# Patient Record
Sex: Female | Born: 1935 | Race: White | Hispanic: No | Marital: Married | Smoking: Never smoker
Health system: Southern US, Community
[De-identification: ages and names within clinical notes are randomized; demographics above are authoritative.]

## PROBLEM LIST (undated history)

## (undated) DIAGNOSIS — C801 Malignant (primary) neoplasm, unspecified: Secondary | ICD-10-CM

## (undated) DIAGNOSIS — R072 Precordial pain: Secondary | ICD-10-CM

## (undated) DIAGNOSIS — K56609 Unspecified intestinal obstruction, unspecified as to partial versus complete obstruction: Secondary | ICD-10-CM

## (undated) DIAGNOSIS — K5792 Diverticulitis of intestine, part unspecified, without perforation or abscess without bleeding: Secondary | ICD-10-CM

## (undated) DIAGNOSIS — K5900 Constipation, unspecified: Secondary | ICD-10-CM

## (undated) DIAGNOSIS — E7800 Pure hypercholesterolemia, unspecified: Secondary | ICD-10-CM

## (undated) DIAGNOSIS — M1990 Unspecified osteoarthritis, unspecified site: Secondary | ICD-10-CM

## (undated) DIAGNOSIS — C499 Malignant neoplasm of connective and soft tissue, unspecified: Secondary | ICD-10-CM

## (undated) DIAGNOSIS — K5790 Diverticulosis of intestine, part unspecified, without perforation or abscess without bleeding: Secondary | ICD-10-CM

## (undated) DIAGNOSIS — T4145XA Adverse effect of unspecified anesthetic, initial encounter: Secondary | ICD-10-CM

## (undated) DIAGNOSIS — K5909 Other constipation: Secondary | ICD-10-CM

## (undated) DIAGNOSIS — T8859XA Other complications of anesthesia, initial encounter: Secondary | ICD-10-CM

## (undated) HISTORY — PX: ABDOMINAL HYSTERECTOMY: SHX81

## (undated) HISTORY — PX: TONSILLECTOMY: SUR1361

## (undated) HISTORY — PX: TUMOR REMOVAL: SHX12

---

## 1966-04-28 HISTORY — PX: APPENDECTOMY: SHX54

## 1998-04-12 ENCOUNTER — Ambulatory Visit (HOSPITAL_COMMUNITY): Admission: RE | Admit: 1998-04-12 | Discharge: 1998-04-12 | Payer: Self-pay | Admitting: Obstetrics & Gynecology

## 1998-04-12 ENCOUNTER — Encounter: Payer: Self-pay | Admitting: Obstetrics & Gynecology

## 1998-05-02 ENCOUNTER — Ambulatory Visit: Admission: RE | Admit: 1998-05-02 | Discharge: 1998-05-02 | Payer: Self-pay | Admitting: Gynecology

## 1998-05-03 ENCOUNTER — Encounter: Payer: Self-pay | Admitting: Obstetrics & Gynecology

## 1998-05-04 ENCOUNTER — Ambulatory Visit (HOSPITAL_COMMUNITY): Admission: RE | Admit: 1998-05-04 | Discharge: 1998-05-04 | Payer: Self-pay | Admitting: Obstetrics & Gynecology

## 1998-05-04 ENCOUNTER — Encounter: Payer: Self-pay | Admitting: Obstetrics & Gynecology

## 1998-05-04 ENCOUNTER — Encounter: Payer: Self-pay | Admitting: Gynecology

## 1998-05-08 ENCOUNTER — Ambulatory Visit: Admission: RE | Admit: 1998-05-08 | Discharge: 1998-05-08 | Payer: Self-pay | Admitting: Gynecology

## 1998-11-28 ENCOUNTER — Other Ambulatory Visit: Admission: RE | Admit: 1998-11-28 | Discharge: 1998-11-28 | Payer: Self-pay | Admitting: Obstetrics & Gynecology

## 1998-12-04 ENCOUNTER — Ambulatory Visit (HOSPITAL_COMMUNITY): Admission: RE | Admit: 1998-12-04 | Discharge: 1998-12-04 | Payer: Self-pay | Admitting: Obstetrics & Gynecology

## 1998-12-04 ENCOUNTER — Encounter: Payer: Self-pay | Admitting: Obstetrics & Gynecology

## 1999-10-21 ENCOUNTER — Ambulatory Visit (HOSPITAL_COMMUNITY): Admission: RE | Admit: 1999-10-21 | Discharge: 1999-10-21 | Payer: Self-pay | Admitting: Obstetrics & Gynecology

## 1999-10-21 ENCOUNTER — Encounter: Payer: Self-pay | Admitting: Obstetrics & Gynecology

## 1999-12-17 ENCOUNTER — Other Ambulatory Visit: Admission: RE | Admit: 1999-12-17 | Discharge: 1999-12-17 | Payer: Self-pay | Admitting: Obstetrics & Gynecology

## 2000-08-13 ENCOUNTER — Emergency Department (HOSPITAL_COMMUNITY): Admission: EM | Admit: 2000-08-13 | Discharge: 2000-08-14 | Payer: Self-pay | Admitting: Emergency Medicine

## 2000-08-14 ENCOUNTER — Encounter: Payer: Self-pay | Admitting: Emergency Medicine

## 2000-08-20 ENCOUNTER — Ambulatory Visit (HOSPITAL_COMMUNITY): Admission: RE | Admit: 2000-08-20 | Discharge: 2000-08-20 | Payer: Self-pay | Admitting: Obstetrics & Gynecology

## 2000-08-20 ENCOUNTER — Encounter: Payer: Self-pay | Admitting: Obstetrics & Gynecology

## 2001-04-28 HISTORY — PX: COLECTOMY: SHX59

## 2001-11-24 ENCOUNTER — Ambulatory Visit (HOSPITAL_COMMUNITY): Admission: RE | Admit: 2001-11-24 | Discharge: 2001-11-24 | Payer: Self-pay | Admitting: Obstetrics & Gynecology

## 2001-11-24 ENCOUNTER — Encounter: Payer: Self-pay | Admitting: Obstetrics & Gynecology

## 2001-12-08 ENCOUNTER — Other Ambulatory Visit: Admission: RE | Admit: 2001-12-08 | Discharge: 2001-12-08 | Payer: Self-pay | Admitting: Obstetrics & Gynecology

## 2002-12-14 ENCOUNTER — Encounter: Payer: Self-pay | Admitting: Obstetrics & Gynecology

## 2002-12-14 ENCOUNTER — Ambulatory Visit (HOSPITAL_COMMUNITY): Admission: RE | Admit: 2002-12-14 | Discharge: 2002-12-14 | Payer: Self-pay | Admitting: Obstetrics & Gynecology

## 2002-12-22 ENCOUNTER — Other Ambulatory Visit: Admission: RE | Admit: 2002-12-22 | Discharge: 2002-12-22 | Payer: Self-pay | Admitting: Obstetrics & Gynecology

## 2003-11-17 ENCOUNTER — Ambulatory Visit (HOSPITAL_COMMUNITY): Admission: RE | Admit: 2003-11-17 | Discharge: 2003-11-17 | Payer: Self-pay | Admitting: Obstetrics & Gynecology

## 2003-11-30 ENCOUNTER — Other Ambulatory Visit: Admission: RE | Admit: 2003-11-30 | Discharge: 2003-11-30 | Payer: Self-pay | Admitting: Obstetrics & Gynecology

## 2004-11-18 ENCOUNTER — Ambulatory Visit (HOSPITAL_COMMUNITY): Admission: RE | Admit: 2004-11-18 | Discharge: 2004-11-18 | Payer: Self-pay | Admitting: Obstetrics & Gynecology

## 2005-09-18 ENCOUNTER — Ambulatory Visit (HOSPITAL_COMMUNITY): Admission: RE | Admit: 2005-09-18 | Discharge: 2005-09-18 | Payer: Self-pay | Admitting: Obstetrics & Gynecology

## 2007-01-06 ENCOUNTER — Ambulatory Visit (HOSPITAL_COMMUNITY): Admission: RE | Admit: 2007-01-06 | Discharge: 2007-01-06 | Payer: Self-pay | Admitting: Obstetrics & Gynecology

## 2007-05-28 IMAGING — CT CT ABDOMEN W/ CM
1 of 3 series · 13 of 32 positions shown, 18 images · IV contrast ([ID] READICAT & [ID] OMNIP 300%)
Comparison: none

CLINICAL DATA: Evaluate leiomyosarcoma.  Status post hysterectomy.
TECHNIQUE: Following the adminstration of 100 cc of Omnipaque 300 IV and the administration of oral contrast, spiral scanning was performed from the lung bases to the pubic symphysis.  Both soft tissue and lung windows were obtained.  Multidetector CT imaging of the abdomen was performed following the standard protocol without IV contrast.  
Comparison is made with the previous exam dated 11/17/03.
ABDOMINAL CT WITH CONTRAST:
The lung bases appear clear.
The liver, spleen, adrenal glands, gallbladder, pancreas, and kidneys all have a normal post-contrasted appearance.  Delayed images through the kidneys demonstrate symmetric function.  Adequate proximal bowel contrast was achieved and no focal bowel abnormalities are seen.  No intraabdominal fluid or abnormal adenopathy is noted.  
Degenerative changes of the lumbar vertebral bodies are identified.  Also noted is a 1.4 x 1.2 cm low attenuating focus within the left aspect of the L4 vertebral body.  This was noted on prior exam.  A similar, but smaller area is identified within the central aspect of the T12 vertebral body measuring 0.8 x 0.8 cm.  These lesions are most compatible with bony hemangioma and were present on the prior exam and unchanged since that time.

[Series 2: abd pelvis · axial · 0.65mm/px · z∈[-519,-169]mm · 13 of 82 slices shown, 18 images]
[im 6/82  soft-tissue]
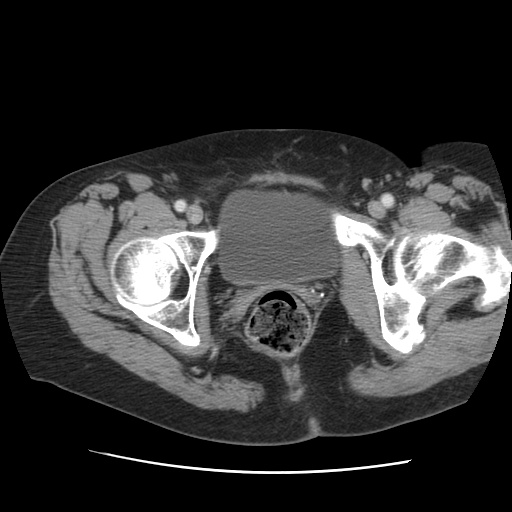
[im 6/82  bone]
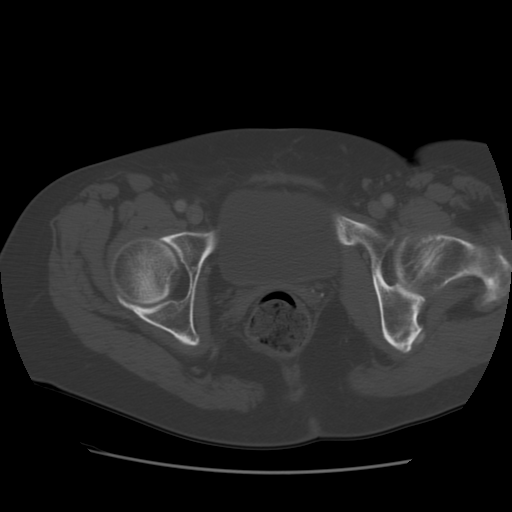
[im 11/82  soft-tissue]
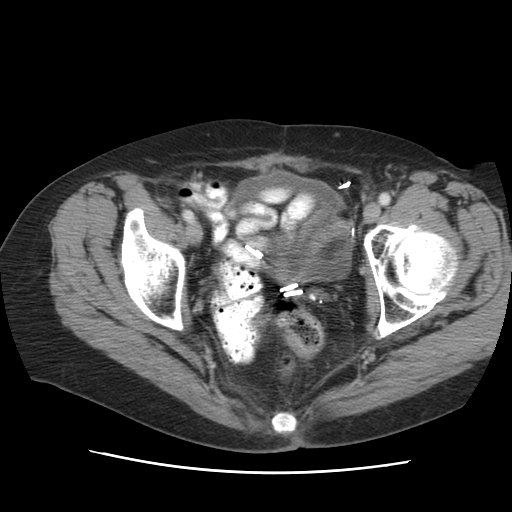
[im 21/82  soft-tissue]
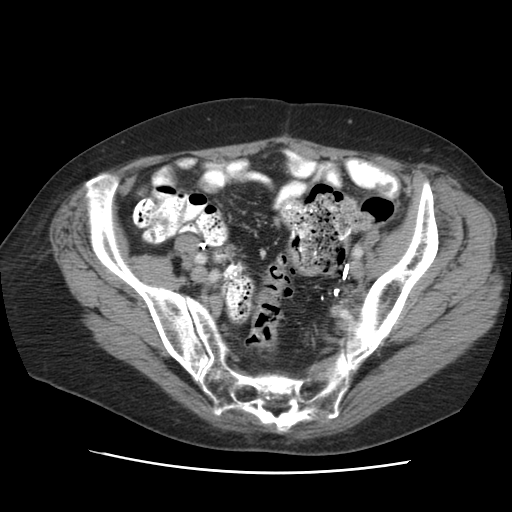
[im 26/82  soft-tissue]
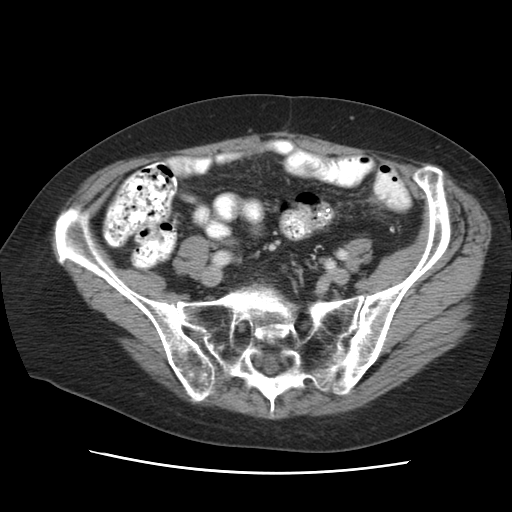
[im 31/82  soft-tissue]
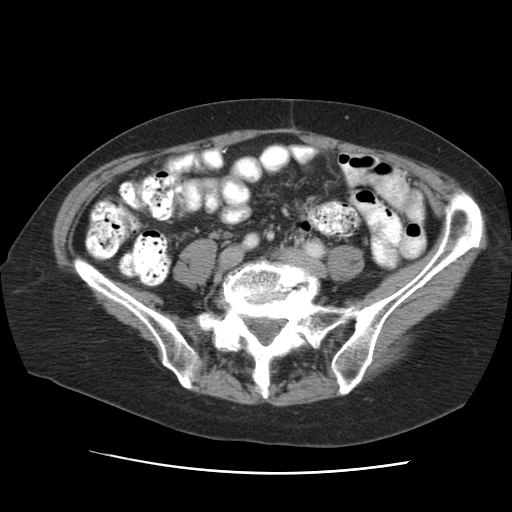
[im 36/82  soft-tissue]
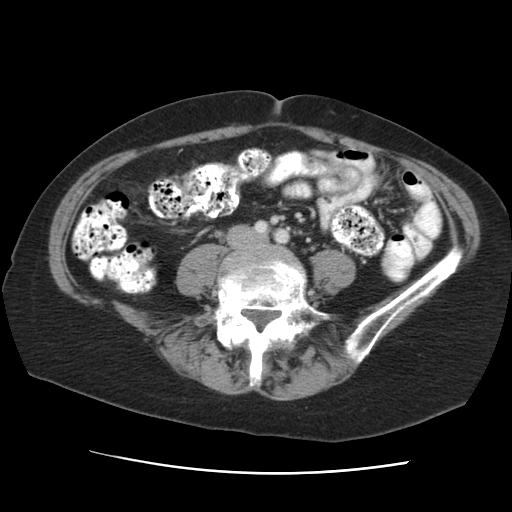
[im 46/82  soft-tissue]
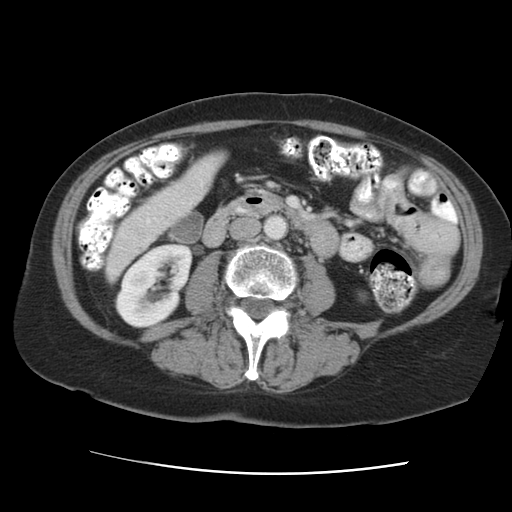
[im 51/82  soft-tissue]
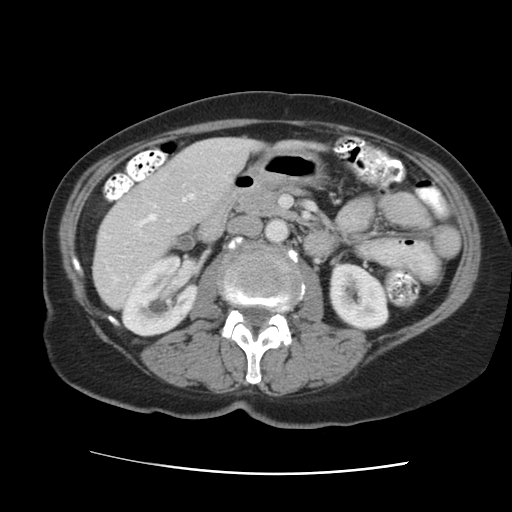
[im 56/82  soft-tissue]
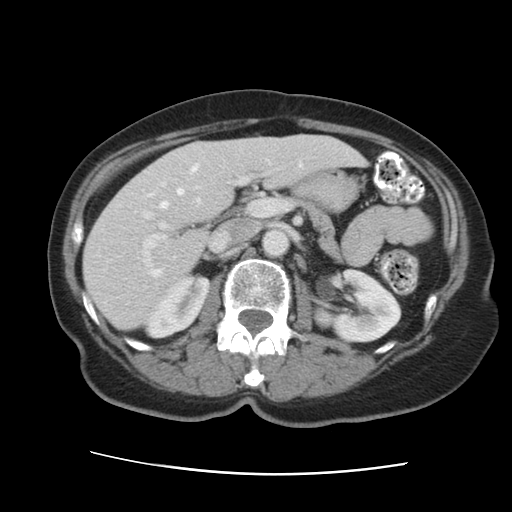
[im 56/82  bone]
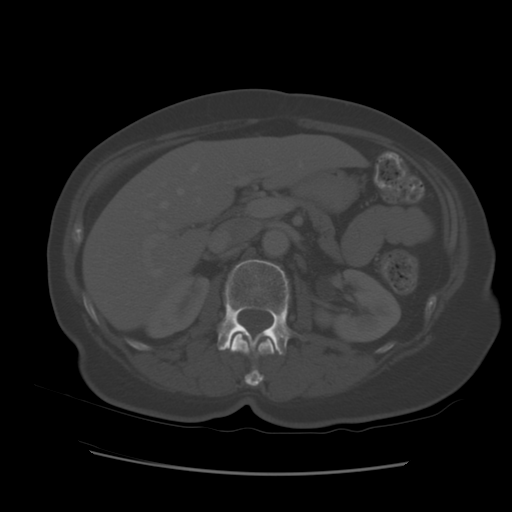
[im 61/82  soft-tissue]
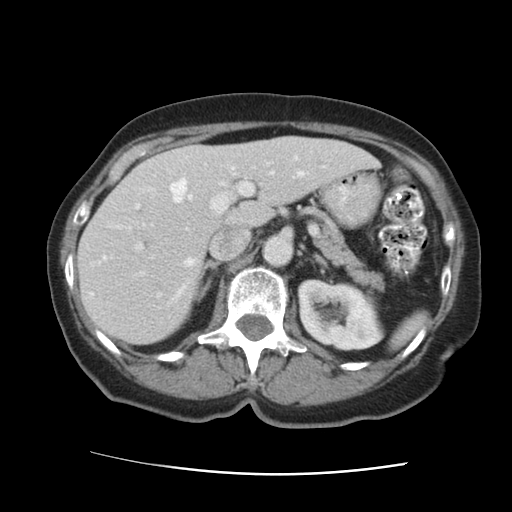
[im 61/82  lung]
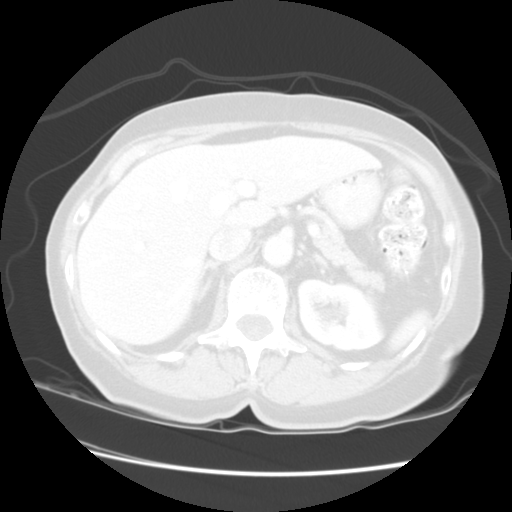
[im 66/82  lung]
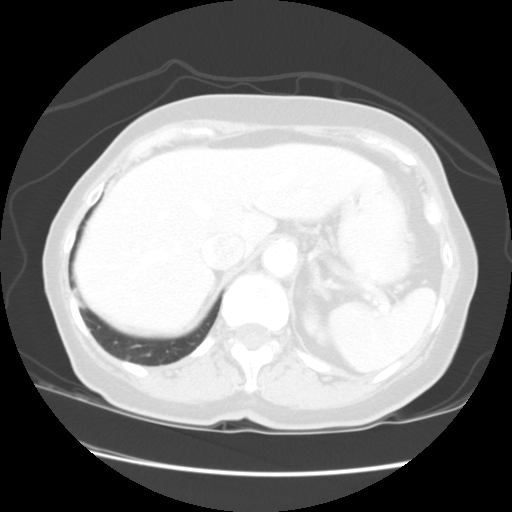
[im 71/82  soft-tissue]
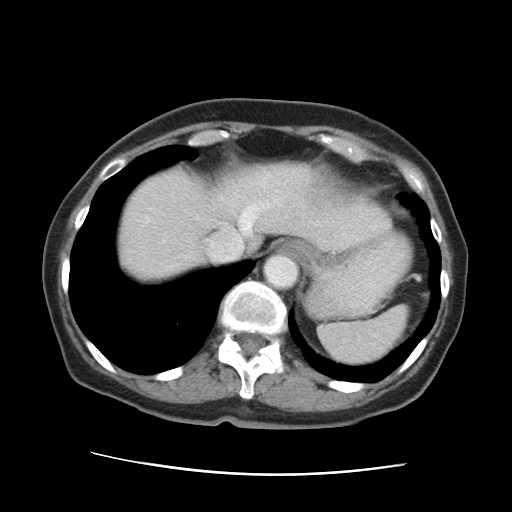
[im 71/82  lung]
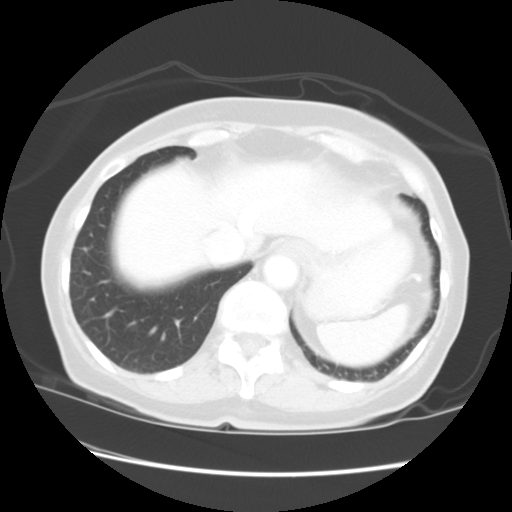
[im 76/82  soft-tissue]
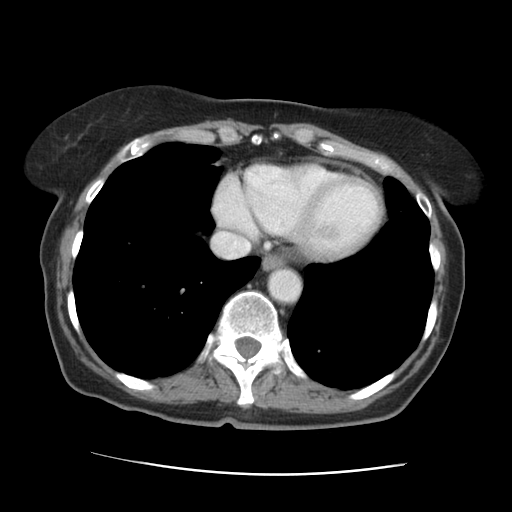
[im 76/82  lung]
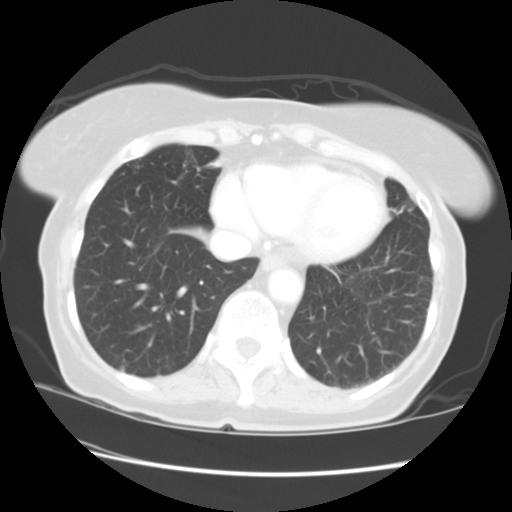

[13 of 32 positions shown; findings below may reference images not displayed]

IMPRESSION: Findings most compatible with bony hemangioma as described above.  Otherwise, unremarkable abdominal CT.
PELVIC CT WITH CONTRAST:
Good distal bowel contrast was achieved.  The patient is status hysterectomy and a normal appearance to the vaginal cuff is seen.  Surgical clips are identified.  no signs of pelvic lymphadenopathy or pelvic fluid is seen.  No focal bowel abnormality is identified.  Degenerative change of the L5 vertebral body is seen and bony structures are otherwise intact.  No evidence for inguinal adenopathy is apparent.
IMPRESSION: 1.  Post-surgical changes of the pelvis with no focal abnormality seen.
2.  No significant interval change of either the abdomen or pelvis is noted in comparison with the prior CT of 11/17/03.

## 2008-01-05 ENCOUNTER — Ambulatory Visit (HOSPITAL_COMMUNITY): Admission: RE | Admit: 2008-01-05 | Discharge: 2008-01-05 | Payer: Self-pay | Admitting: Obstetrics & Gynecology

## 2009-08-26 DIAGNOSIS — K56609 Unspecified intestinal obstruction, unspecified as to partial versus complete obstruction: Secondary | ICD-10-CM

## 2009-08-26 HISTORY — DX: Unspecified intestinal obstruction, unspecified as to partial versus complete obstruction: K56.609

## 2009-09-13 ENCOUNTER — Inpatient Hospital Stay (HOSPITAL_COMMUNITY): Admission: EM | Admit: 2009-09-13 | Discharge: 2009-09-16 | Payer: Self-pay | Admitting: Emergency Medicine

## 2010-07-15 LAB — COMPREHENSIVE METABOLIC PANEL
ALT: 12 U/L (ref 0–35)
ALT: 14 U/L (ref 0–35)
AST: 17 U/L (ref 0–37)
AST: 18 U/L (ref 0–37)
Albumin: 2.8 g/dL — ABNORMAL LOW (ref 3.5–5.2)
Albumin: 3.4 g/dL — ABNORMAL LOW (ref 3.5–5.2)
Alkaline Phosphatase: 32 U/L — ABNORMAL LOW (ref 39–117)
Alkaline Phosphatase: 38 U/L — ABNORMAL LOW (ref 39–117)
BUN: 5 mg/dL — ABNORMAL LOW (ref 6–23)
BUN: 9 mg/dL (ref 6–23)
CO2: 27 mEq/L (ref 19–32)
CO2: 28 mEq/L (ref 19–32)
Calcium: 8.1 mg/dL — ABNORMAL LOW (ref 8.4–10.5)
Calcium: 8.5 mg/dL (ref 8.4–10.5)
Chloride: 105 mEq/L (ref 96–112)
Chloride: 113 mEq/L — ABNORMAL HIGH (ref 96–112)
Creatinine, Ser: 0.49 mg/dL (ref 0.4–1.2)
Creatinine, Ser: 0.72 mg/dL (ref 0.4–1.2)
GFR calc Af Amer: >60 mL/min (ref >60)
GFR calc Af Amer: >60 mL/min (ref >60)
GFR calc non Af Amer: >60 mL/min (ref >60)
GFR calc non Af Amer: >60 mL/min (ref >60)
Glucose, Bld: 128 mg/dL — ABNORMAL HIGH (ref 70–99)
Glucose, Bld: 137 mg/dL — ABNORMAL HIGH (ref 70–99)
Potassium: 3 mEq/L — ABNORMAL LOW (ref 3.5–5.1)
Potassium: 3.4 mEq/L — ABNORMAL LOW (ref 3.5–5.1)
Sodium: 139 mEq/L (ref 135–145)
Sodium: 146 mEq/L — ABNORMAL HIGH (ref 135–145)
Total Bilirubin: 0.9 mg/dL (ref 0.3–1.2)
Total Bilirubin: 1.2 mg/dL (ref 0.3–1.2)
Total Protein: 5 g/dL — ABNORMAL LOW (ref 6.0–8.3)
Total Protein: 5.8 g/dL — ABNORMAL LOW (ref 6.0–8.3)

## 2010-07-15 LAB — CBC
HCT: 33.3 % — ABNORMAL LOW (ref 36.0–46.0)
HCT: 36.6 % (ref 36.0–46.0)
Hemoglobin: 11.1 g/dL — ABNORMAL LOW (ref 12.0–15.0)
Hemoglobin: 12.3 g/dL (ref 12.0–15.0)
MCHC: 33.2 g/dL (ref 30.0–36.0)
MCHC: 33.6 g/dL (ref 30.0–36.0)
MCV: 94.2 fL (ref 78.0–100.0)
MCV: 94.6 fL (ref 78.0–100.0)
Platelets: 171 10*3/uL (ref 150–400)
Platelets: 198 10*3/uL (ref 150–400)
RBC: 3.52 MIL/uL — ABNORMAL LOW (ref 3.87–5.11)
RBC: 3.88 MIL/uL (ref 3.87–5.11)
RDW: 13.3 % (ref 11.5–15.5)
RDW: 13.6 % (ref 11.5–15.5)
WBC: 4.5 10*3/uL (ref 4.0–10.5)
WBC: 7.6 10*3/uL (ref 4.0–10.5)

## 2010-07-15 LAB — POCT I-STAT, CHEM 8
BUN: 18 mg/dL (ref 6–23)
Calcium, Ion: 1.13 mmol/L (ref 1.12–1.32)
Chloride: 107 mEq/L (ref 96–112)
Creatinine, Ser: 0.6 mg/dL (ref 0.4–1.2)
Glucose, Bld: 117 mg/dL — ABNORMAL HIGH (ref 70–99)
HCT: 42 % (ref 36.0–46.0)
Hemoglobin: 14.3 g/dL (ref 12.0–15.0)
Potassium: 4 mEq/L (ref 3.5–5.1)
Sodium: 140 mEq/L (ref 135–145)
TCO2: 27 mmol/L (ref 0–100)

## 2010-07-15 LAB — LACTIC ACID, PLASMA: Lactic Acid, Venous: 0.9 mmol/L (ref 0.5–2.2)

## 2010-07-15 LAB — BASIC METABOLIC PANEL
BUN: 3 mg/dL — ABNORMAL LOW (ref 6–23)
CO2: 28 mEq/L (ref 19–32)
Calcium: 8.3 mg/dL — ABNORMAL LOW (ref 8.4–10.5)
Chloride: 109 mEq/L (ref 96–112)
Creatinine, Ser: 0.49 mg/dL (ref 0.4–1.2)
GFR calc Af Amer: >60 mL/min (ref >60)
GFR calc non Af Amer: >60 mL/min (ref >60)
Glucose, Bld: 111 mg/dL — ABNORMAL HIGH (ref 70–99)
Potassium: 3.8 mEq/L (ref 3.5–5.1)
Sodium: 142 mEq/L (ref 135–145)

## 2010-07-15 LAB — PROTIME-INR
INR: 1.07 (ref 0.00–1.49)
Prothrombin Time: 13.8 seconds (ref 11.6–15.2)

## 2010-07-15 LAB — APTT: aPTT: 25 seconds (ref 24–37)

## 2011-01-29 LAB — CREATININE, SERUM
Creatinine, Ser: 0.66
GFR calc Af Amer: >60
GFR calc non Af Amer: >60

## 2011-01-29 LAB — BUN: BUN: 13

## 2011-08-17 ENCOUNTER — Emergency Department (HOSPITAL_COMMUNITY): Payer: Medicare Other

## 2011-08-17 ENCOUNTER — Ambulatory Visit: Payer: Medicare Other

## 2011-08-17 ENCOUNTER — Ambulatory Visit (INDEPENDENT_AMBULATORY_CARE_PROVIDER_SITE_OTHER): Payer: Medicare Other | Admitting: Emergency Medicine

## 2011-08-17 ENCOUNTER — Inpatient Hospital Stay (HOSPITAL_COMMUNITY)
Admission: EM | Admit: 2011-08-17 | Discharge: 2011-08-19 | DRG: 392 | Disposition: A | Discharge status: Home / Self Care | Payer: Medicare Other | Attending: Internal Medicine | Admitting: Internal Medicine

## 2011-08-17 ENCOUNTER — Encounter (HOSPITAL_COMMUNITY): Payer: Self-pay | Admitting: *Deleted

## 2011-08-17 DIAGNOSIS — Z79899 Other long term (current) drug therapy: Secondary | ICD-10-CM

## 2011-08-17 DIAGNOSIS — K5730 Diverticulosis of large intestine without perforation or abscess without bleeding: Secondary | ICD-10-CM | POA: Diagnosis present

## 2011-08-17 DIAGNOSIS — E7800 Pure hypercholesterolemia, unspecified: Secondary | ICD-10-CM | POA: Diagnosis present

## 2011-08-17 DIAGNOSIS — D72829 Elevated white blood cell count, unspecified: Secondary | ICD-10-CM | POA: Diagnosis present

## 2011-08-17 DIAGNOSIS — R1084 Generalized abdominal pain: Secondary | ICD-10-CM

## 2011-08-17 DIAGNOSIS — K5289 Other specified noninfective gastroenteritis and colitis: Principal | ICD-10-CM | POA: Diagnosis present

## 2011-08-17 DIAGNOSIS — K529 Noninfective gastroenteritis and colitis, unspecified: Secondary | ICD-10-CM | POA: Diagnosis present

## 2011-08-17 DIAGNOSIS — Z9049 Acquired absence of other specified parts of digestive tract: Secondary | ICD-10-CM

## 2011-08-17 DIAGNOSIS — R1110 Vomiting, unspecified: Secondary | ICD-10-CM

## 2011-08-17 HISTORY — DX: Unspecified intestinal obstruction, unspecified as to partial versus complete obstruction: K56.609

## 2011-08-17 HISTORY — DX: Malignant (primary) neoplasm, unspecified: C80.1

## 2011-08-17 HISTORY — DX: Pure hypercholesterolemia, unspecified: E78.00

## 2011-08-17 HISTORY — DX: Diverticulitis of intestine, part unspecified, without perforation or abscess without bleeding: K57.92

## 2011-08-17 LAB — COMPREHENSIVE METABOLIC PANEL
ALT: 14 U/L (ref 0–35)
AST: 22 U/L (ref 0–37)
Albumin: 4.3 g/dL (ref 3.5–5.2)
Alkaline Phosphatase: 55 U/L (ref 39–117)
BUN: 17 mg/dL (ref 6–23)
CO2: 31 mEq/L (ref 19–32)
Calcium: 9.4 mg/dL (ref 8.4–10.5)
Chloride: 102 mEq/L (ref 96–112)
Creat: 0.68 mg/dL (ref 0.50–1.10)
Glucose, Bld: 106 mg/dL — ABNORMAL HIGH (ref 70–99)
Potassium: 4.6 mEq/L (ref 3.5–5.3)
Sodium: 141 mEq/L (ref 135–145)
Total Bilirubin: 0.9 mg/dL (ref 0.3–1.2)
Total Protein: 6.9 g/dL (ref 6.0–8.3)

## 2011-08-17 LAB — POCT URINALYSIS DIPSTICK
Blood, UA: NEGATIVE
Glucose, UA: NEGATIVE
Ketones, UA: 40
Nitrite, UA: NEGATIVE
Spec Grav, UA: 1.025
Urobilinogen, UA: 1
pH, UA: 6

## 2011-08-17 LAB — POCT CBC
Granulocyte percent: 83.3 %G — AB (ref 37–80)
HCT, POC: 44.8 % (ref 37.7–47.9)
Hemoglobin: 14.6 g/dL (ref 12.2–16.2)
Lymph, poc: 1.6 (ref 0.6–3.4)
MCH, POC: 30.5 pg (ref 27–31.2)
MCHC: 32.6 g/dL (ref 31.8–35.4)
MCV: 93.6 fL (ref 80–97)
MID (cbc): 0.6 (ref 0–0.9)
MPV: 9.5 fL (ref 0–99.8)
POC Granulocyte: 11 — AB (ref 2–6.9)
POC LYMPH PERCENT: 11.9 %L (ref 10–50)
POC MID %: 4.8 %M (ref 0–12)
Platelet Count, POC: 322 10*3/uL (ref 142–424)
RBC: 4.79 M/uL (ref 4.04–5.48)
RDW, POC: 13.5 %
WBC: 13.2 10*3/uL — AB (ref 4.6–10.2)

## 2011-08-17 LAB — POCT UA - MICROSCOPIC ONLY
Bacteria, U Microscopic: NEGATIVE
Casts, Ur, LPF, POC: NEGATIVE
Crystals, Ur, HPF, POC: NEGATIVE
Epithelial cells, urine per micros: NEGATIVE
RBC, urine, microscopic: NEGATIVE
Yeast, UA: NEGATIVE

## 2011-08-17 LAB — LIPASE, BLOOD: Lipase: 25 U/L (ref 11–59)

## 2011-08-17 LAB — LACTIC ACID, PLASMA: Lactic Acid, Venous: 1.1 mmol/L (ref 0.5–2.2)

## 2011-08-17 MED ORDER — ONDANSETRON HCL 4 MG/2ML IJ SOLN
4.0000 mg | Freq: Once | INTRAMUSCULAR | Status: AC
  Administered 2011-08-17: 4 mg via INTRAVENOUS
  Filled 2011-08-17: qty 2

## 2011-08-17 MED ORDER — MORPHINE SULFATE 2 MG/ML IJ SOLN
2.0000 mg | Freq: Once | INTRAMUSCULAR | Status: AC
  Administered 2011-08-17: 2 mg via INTRAVENOUS
  Filled 2011-08-17: qty 1

## 2011-08-17 MED ORDER — SODIUM CHLORIDE 0.9 % IV BOLUS (SEPSIS)
500.0000 mL | Freq: Once | INTRAVENOUS | Status: AC
  Administered 2011-08-17: 500 mL via INTRAVENOUS

## 2011-08-17 MED ORDER — IOHEXOL 300 MG/ML  SOLN
100.0000 mL | Freq: Once | INTRAMUSCULAR | Status: AC | PRN
  Administered 2011-08-17: 100 mL via INTRAVENOUS

## 2011-08-17 MED ORDER — METRONIDAZOLE IN NACL 5-0.79 MG/ML-% IV SOLN
500.0000 mg | Freq: Once | INTRAVENOUS | Status: AC
  Administered 2011-08-17: 500 mg via INTRAVENOUS
  Filled 2011-08-17: qty 100

## 2011-08-17 MED ORDER — SODIUM CHLORIDE 0.9 % IV SOLN: INTRAVENOUS | Status: DC

## 2011-08-17 MED ORDER — CIPROFLOXACIN IN D5W 400 MG/200ML IV SOLN
400.0000 mg | Freq: Once | INTRAVENOUS | Status: AC
  Administered 2011-08-17: 400 mg via INTRAVENOUS
  Filled 2011-08-17: qty 200

## 2011-08-17 NOTE — ED Notes (Signed)
ZOX:[id number]<AV> Expected date:<BR> Expected time:12:57 PM<BR> Means of arrival:<BR> Comments:<BR> ROCK5 - 89yoF Fall, shoulder pain, FH lac

## 2011-08-17 NOTE — ED Notes (Signed)
Pt reports PMH of diverticulitis and has been having chills, N/V, ABD pain

## 2011-08-17 NOTE — ED Notes (Addendum)
Pt sent from Urgent Care with N/V and lower abdominal pain that started yesterday afternoon. Pt brought a copy of lab work done at Urgent Care.

## 2011-08-17 NOTE — ED Provider Notes (Signed)
History     CSN: [id number]  Arrival date & time 08/17/11  1130   First MD Initiated Contact with Patient 08/17/11 1448      Chief Complaint  Patient presents with  . Nausea  . Emesis  . Abdominal Pain    lower    (Consider location/radiation/quality/duration/timing/severity/associated sxs/prior treatment) The history is provided by the patient.   patient is an 78 year old female with history of multiple abdominal surgeries, diverticulitis, and small bowel obstruction who presents to the emergency Department after referral by her primary doctor and an urgent care with a chief complaint of lower abdominal pain since yesterday afternoon. Pain to the bilateral lower abdomen, achy and sharp, waxing and waning. It has been associated with nausea and 3 episodes of nonbloody nonbilious emesis overnight. There has been no diarrhea. Last bowel movement was yesterday and was normal per patient. She is passing flatus today. At urgent care, she was found to have a mild leukocytosis. Abdominal plain films were suggestive of possible SBO, per initial provider interpretation. She was then advised to present to ED. No prior treatment.  Past Medical History  Diagnosis Date  . Cancer   . High cholesterol     Past Surgical History  Procedure Date  . Cesarean section   . Appendectomy   . Abdominal surgery   . Tumor removal     last tumor was cancer  . Abdominal hysterectomy     History reviewed. No pertinent family history.  History  Substance Use Topics  . Smoking status: Never Smoker   . Smokeless tobacco: Never Used  . Alcohol Use: Yes     social    Review of Systems  Constitutional: Positive for chills. Negative for fever.  HENT: Negative for sore throat, neck pain and neck stiffness.   Eyes: Negative for pain and visual disturbance.  Respiratory: Negative for cough and shortness of breath.   Cardiovascular: Negative for chest pain.  Gastrointestinal: Positive for nausea,  vomiting and abdominal pain. Negative for diarrhea, constipation and blood in stool.  Genitourinary: Negative for dysuria and hematuria.  Musculoskeletal: Negative for back pain and gait problem.  Skin: Negative for rash.  Neurological: Negative for dizziness, syncope, weakness, light-headedness and headaches.  Psychiatric/Behavioral: Negative for confusion.    Allergies  Review of patient's allergies indicates no known allergies.  Home Medications   Current Outpatient Rx  Name Route Sig Dispense Refill  . ADULT MULTIVITAMIN W/MINERALS CH Oral Take 1 tablet by mouth daily.    . OMEGA-3-ACID ETHYL ESTERS 1 G PO CAPS Oral Take 1 g by mouth daily.    Marland Kitchen SIMVASTATIN 20 MG PO TABS Oral Take 20 mg by mouth every evening.    Marland Kitchen ZINC 15 PO Oral Take by mouth daily.      BP 117/62  Pulse 54  Temp(Src) 97.5 F (36.4 C) (Oral)  Resp 16  SpO2 99%  Physical Exam  Nursing note and vitals reviewed. Constitutional: She is oriented to person, place, and time. She appears well-developed and well-nourished. No distress.  HENT:  Head: Normocephalic and atraumatic.  Right Ear: External ear normal.  Left Ear: External ear normal.       Oral mucosa moist  Eyes: Conjunctivae are normal. Pupils are equal, round, and reactive to light.  Neck: Normal range of motion. Neck supple.  Cardiovascular: Normal rate, regular rhythm, normal heart sounds and intact distal pulses.   Pulmonary/Chest: Effort normal. No respiratory distress. She has no wheezes. She exhibits no tenderness.  Abdominal: Soft. Bowel sounds are normal. She exhibits no distension. There is Tenderness: moderate tenderness in bilateral lower quadrants and suprapubic region.. There is no rebound and no guarding.       No rigidity  Musculoskeletal: She exhibits no edema and no tenderness.  Neurological: She is alert and oriented to person, place, and time.  Skin: Skin is warm and dry.  Psychiatric: She has a normal mood and affect.    ED  Course  Procedures (including critical care time)   Labs Reviewed  LACTIC ACID, PLASMA  LIPASE, BLOOD   Ct Abdomen Pelvis W Contrast  08/17/2011  *RADIOLOGY REPORT*  Clinical Data: Abdominal pain.  Elevated white blood cell count.  CT ABDOMEN AND PELVIS WITH CONTRAST  Technique:  Multidetector CT imaging of the abdomen and pelvis was performed following the standard protocol during bolus administration of intravenous contrast.  Contrast: OMNIPAQUE IOHEXOL 300 MG/ML  SOLN  Comparison: CT abdomen and pelvis 09/13/2009.  Findings: Dependent atelectasis is seen in the lung bases.  No pleural or pericardial effusion.  There is a small amount of perihepatic ascites.  Multiple nonsegmental thickened loops of small bowel are identified centrally in the pelvis and in the right lower quadrant involving both loops of jejunum and ileum.  There is no small bowel obstruction.  No pneumatosis, portal venous gas or free intraperitoneal air is identified.  There is some scattered atherosclerotic vascular disease but the celiac axis and superior and inferior mesenteric arteries appear to opacify with contrast. The colon and stomach appear normal.  There is no bowel obstruction.  The gallbladder, biliary tree, liver, spleen, adrenal glands, pancreas and right kidney are unremarkable.  There is a low attenuation lesion in the lower pole left kidney consistent with a cyst.  No focal bony lesion is identified with lower lumbar degenerative change noted.  IMPRESSION:  1.  Abnormal thickening of multiple loops of small bowel could be due to infectious or inflammatory enteritis.  Ischemic change is within the differential but no secondary signs of ischemia are identified.  There is no bowel obstruction. 2.  Small amount of perihepatic ascites. 3.  Unchanged right renal cyst.  Original Report Authenticated By: Bernadene Bell. Maricela Curet, M.D.   Dg Abd 2 Views  08/17/2011  *RADIOLOGY REPORT*  Clinical Data: Abdominal pain.  Nausea  vomiting.  ABDOMEN - 2 VIEW  Comparison: Chest films same date  Findings: Upright and supine views.  Upright view demonstrates no free intraperitoneal air or significant air fluid levels.  A supine view demonstrates no significant gaseous distention of bowel.  Extensive surgical changes in the pelvis.  Numerous phleboliths.  Distal gas and stool.  No abnormal abdominal calcifications.  No pneumatosis.  Mild degenerative changes of the bilateral hips.  IMPRESSION: No acute findings.  Clinically significant discrepancy from primary report, if provided: None  Original Report Authenticated By: Consuello Bossier, M.D.     1. Enteritis       MDM  Labs from [facility] reviewed. Leukocytosis with a white blood cell count of 13.2. Otherwise unremarkable complete metabolic panel and CBC. No evidence of UTI on urinalysis. Patient understandably refuses that these tests be performed again in the emergency department. She does agree to the addition of lipase testing as well as a CT scan of the abdomen to evaluate for small bowel obstruction versus diverticulitis.      7:52 PM CT scan of the abdomen and pelvis demonstrates likely enteritis. Ischemia not ruled out based on  scan, though lack of peritonitis, fever, or sig pain at rest makes this less likely. Given age of pt, leukocytosis, and vomiting overnight that make ingestion of oral abx difficult, will plan for admit for further treatment.  Shaaron Adler, PA-C 08/17/11 2014

## 2011-08-17 NOTE — H&P (Signed)
PCP:  Elgie Congo. Confirmed with pt. 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   Pt was last admitted in 08/2009 with abdominal pain and found on CT to have  partial SBO. She resolved quickly with bowel rest and NGT and was discharged  stable.   Pt is reliable historian, states she spends 6 mos in [place] and 6 mos here,  and got back from [facility] on Friday. She ate some cereal that she thought was bad as  it was 54 months old and very hard on Saturday and not long after developed some  abdominal pain, diffusely centered in the middle of the abdomen, with nausea,  but no vomiting or other changes in bowel habits, specifically no diarrhea. She  told her husband the cereal was bad and threw it away. Pt saw PCP Dr. Cleta Alberts for  these complaints on 4/21. He documents 5 surgeries on her abdomen and h/o  malignant tumor in her abdomen, h/o diverticulitis. He did some blood work  which showed elevated WBC count, so sent to ED.   In the ED, vitals were stable. Labs signficant for normal chem, LFT's, lactate,  WBC 13.2, UA negative. Abd plain film negative, CXR with lower lobe  interstitial thickening, nonspecific, otherwise not acute. CTAP with contrast  showed abnormal thickening of multiple loops of small bowel called as  inflammatory vs infectious enteritis, no secondary signs to suggest ischemia,  and no bowel obstruction; small amt of perihepatic ascites and unchagned right  renal cyst.   Pt denies any fevers, but did feel some chills recently. Otherwise, ROS is  negative for cardiopulmonary symptoms, and otherwise also negative. She is  usually in good health, goes bike riding and stays active.     Past Medical History  Diagnosis  Date  . Cancer Around 2002-2003    Unclear history of "7 lb" abdominal tumor treated at [facility] ~2003 s/p open abdominal colonic resection and radiation  . High cholesterol   . SBO (small bowel obstruction) 08/2009    Early, partial SBO improved with conservative Tx   . Diverticulitis     Per chart     Past Surgical History  Procedure Date  . Cesarean section   . Appendectomy   . Abdominal surgery   . Tumor removal     last tumor was cancer  . Abdominal hysterectomy 2006    Medications:  HOME MEDS: Reconciled with pt Prior to Admission medications   Medication Sig Start Date End Date Taking? Authorizing Provider  Multiple Vitamin (MULITIVITAMIN WITH MINERALS) TABS Take 1 tablet by mouth daily.   Yes Historical Provider, MD  omega-3 acid ethyl esters (LOVAZA) 1 G capsule Take 1 g by mouth daily.   Yes Historical Provider, MD  simvastatin (ZOCOR) 20 MG tablet Take 20 mg by mouth every evening.   Yes Historical Provider, MD  Zinc Sulfate (ZINC 15 PO) Take by mouth daily.   Yes Historical Provider, MD    Allergies:  No Known Allergies  Social History:   reports that she has never smoked. She has never used smokeless tobacco. She reports that she drinks alcohol. She reports that she does not use illicit drugs. She lives at home with her husband and has 3  kids. She is still very active, ambulatory, goes bike riding. Spends half the year in [place] and half the year in [place]. Social smoker previously but not heavy, not current smoker.   Family History: Family History  Problem Relation Age of Onset  . Heart failure Father     Physical Exam: Filed Vitals:   08/17/11 1148 08/17/11 1719  BP: 121/54 117/62  Pulse: 58 54  Temp: 97.5 F (36.4 C)   TempSrc: Oral   Resp: 16   SpO2: 100% 99%   Blood pressure 117/62, pulse 54, temperature 97.5 F (36.4 C), temperature source Oral, resp. rate 16, SpO2 99.00%. Gen: Eldery, tall and thin appearing F in hospital bed, pleasant, doesn't    appear toxic or even ill, smiling and pleasant, able to relate history well,  overall stable.  HEENT: Pupils round and reactive, EOMI, sclera clear, conjunctivae a bit  injected but normal. Mouth/tongue a bit dry but not parched. Teeth in place.  Lungs: CTAB, good air movement, normal exam overall without adventitious sounds Heart: Regular S1/2 without m/g heard, not tachycardic, normal exam Abd; minimally distended but not overwhelming, hyperactive BS's. She does  endorse pain and grimaces when her abd is palpated, not in any particular area  though Extrem: Warm, perfursing well, decreased muscle bulk but normal tone, no BLE  edema noted, overall normal exam Neuro: Alert, attentive, conversant CN 2-12 intact without drooping or slurring  speech, moves her extremities well without difficulty, grossly non-focal.    Labs & Imaging Results for orders placed during the hospital encounter of 08/17/11 (from the past 48 hour(s))  LACTIC ACID, PLASMA     Status: Normal   Collection Time   08/17/11  3:55 PM      Component Value Range Comment   Lactic Acid, Venous 1.1  0.5 - 2.2 (mmol/L)   LIPASE, BLOOD     Status: Normal   Collection Time   08/17/11  3:55 PM      Component Value Range Comment   Lipase 25  11 - 59 (U/L)    Dg Chest 2 View  08/17/2011  *RADIOLOGY REPORT*  Clinical Data: Nausea vomiting since 08/16/2011.  CHEST - 2 VIEW  Comparison: None.  Findings: Midline trachea.  Normal heart size and mediastinal contours for age.  No pleural effusion or pneumothorax.  Mild biapical pleural parenchymal scarring.  Lower lobe predominant interstitial thickening is nonspecific. No lobar consolidation.  No free intraperitoneal air.  IMPRESSION:  1. No acute cardiopulmonary disease. 2.  Lower lobe predominant interstitial thickening, nonspecific, especially in this age group.  Correlate with any history of smoking.  Clinically significant discrepancy from primary report, if provided: None  Original  Report Authenticated By: Consuello Bossier, M.D.   Ct Abdomen Pelvis W Contrast  08/17/2011  *RADIOLOGY REPORT*  Clinical Data: Abdominal pain.  Elevated white blood cell count.  CT ABDOMEN AND PELVIS WITH CONTRAST  Technique:  Multidetector CT imaging of the abdomen and pelvis was performed following the standard protocol during bolus administration of intravenous contrast.  Contrast: OMNIPAQUE IOHEXOL 300 MG/ML  SOLN  Comparison: CT abdomen and pelvis 09/13/2009.  Findings: Dependent atelectasis is seen in the lung bases.  No pleural or pericardial effusion.  There is a small amount of perihepatic ascites.  Multiple nonsegmental thickened loops of small bowel are identified centrally in the pelvis and in the right lower quadrant involving both loops of jejunum and ileum.  There is no small bowel obstruction.  No pneumatosis,  portal venous gas or free intraperitoneal air is identified.  There is some scattered atherosclerotic vascular disease but the celiac axis and superior and inferior mesenteric arteries appear to opacify with contrast. The colon and stomach appear normal.  There is no bowel obstruction.  The gallbladder, biliary tree, liver, spleen, adrenal glands, pancreas and right kidney are unremarkable.  There is a low attenuation lesion in the lower pole left kidney consistent with a cyst.  No focal bony lesion is identified with lower lumbar degenerative change noted.  IMPRESSION:  1.  Abnormal thickening of multiple loops of small bowel could be due to infectious or inflammatory enteritis.  Ischemic change is within the differential but no secondary signs of ischemia are identified.  There is no bowel obstruction. 2.  Small amount of perihepatic ascites. 3.  Unchanged right renal cyst.  Original Report Authenticated By: Bernadene Bell. Maricela Curet, M.D.   Dg Abd 2 Views  08/17/2011  *RADIOLOGY REPORT*  Clinical Data: Abdominal pain.  Nausea vomiting.  ABDOMEN - 2 VIEW  Comparison: Chest films same date   Findings: Upright and supine views.  Upright view demonstrates no free intraperitoneal air or significant air fluid levels.  A supine view demonstrates no significant gaseous distention of bowel.  Extensive surgical changes in the pelvis.  Numerous phleboliths.  Distal gas and stool.  No abnormal abdominal calcifications.  No pneumatosis.  Mild degenerative changes of the bilateral hips.  IMPRESSION: No acute findings.  Clinically significant discrepancy from primary report, if provided: None  Original Report Authenticated By: Consuello Bossier, M.D.    Impression Present on Admission:  .Enteritis .Leukocytosis  82yoF with h/o hysterectomy 2006, unclear h/o ? leiomyosarcoma s/p open  abdominal colon resection, h/o diverticulitis, and SBO 08/2009 conservatively  managed presents with nausea and abdominal pain and found on imaging to have  enteritis.   1. Enteritis: Based on the history of eating bad cereal, I suspect this is  probably a benign, infectious mediated process. She does have WBC 13 but no  documented fever, negative lactate, and overall looks well, not toxic. Despite  her multiple surgeries etc. she doesn't have obstructed type picture. Unless  she gets worse I don't feel overwhelmed for stool cultures, and she's not  having diarrhea anyways.   - Admit and will keep NPO, can try advance to clears tomorrow, run maintenance  IVF's, cipro/flagyl probably not fully needed but not wholly unreasonable  either.    2. HL: continue statin 3. Holding all other non essential home meds   DVT prophy: She is ambulatory Regual[facility] bed, [facility] team 4 Full code, discussed wiht pt   Other plans as per orders.    Rollen Selders 08/17/2011, 10:47 PM

## 2011-08-17 NOTE — ED Provider Notes (Signed)
Medical screening examination/treatment/procedure(s) were conducted as a shared visit with non-physician practitioner(s) and myself.  I personally evaluated the patient during the encounter  Referred from urgent care with mild leukocytosis, abdominal pain. CBC, CMP, UA unremarkable (see EPIC), added lipase and lactic acid--also unremarkable. Min diffuse abd ttp. Mild distension. No R/G. CT A/P pending, PA to f/u.  Forbes Cellar, MD 08/17/11 2308

## 2011-08-17 NOTE — Progress Notes (Signed)
  Subjective:    Patient ID: Monica Ward, female    DOB: [birth year], 78 y.o.   MRN: [id number]  HPI patient enters with onset yesterday of severe low abdominal pain. Patient has had 3 episodes of vomiting. Patient has had a total of 5 surgeries on her abdomen. She has a history of malignant tumor in her abdomen. She has a history of diverticulitis. She feels like she may be having a fever but is not sure of the    Review of Systems     Objective:   Physical Exam physical exam reveals alert female who is not in any distress. Cardiac exam is unremarkable. The abdomen is slightly distended. There are multiple scars present. Bowel sounds are hyperactive. Significant tenderness in the lower abdomen.   [facility] reading (PRIMARY) by  Dr.Travonna Swindle chest x-ray is unremarkable. No free air is seen under the diaphragms. There are multiple clips in the lower abdomen on abdominal series. There is one small loop of small bowel. There is large amount of fluid in the stomach.       Assessment & Plan:  Considering her history of past surgeries small bowel obstruction is a definite possibility. She also has a history of diverticulitis. We'll check CBC urine acute abdominal series and proceed from there. On followup of her testing her white count is elevated. I do not see any definite obstruction on her films. She does have a stomach Distaflo fluid and she has not eaten since last night. I suspect she may have acute diverticulitis versus early small bowel obstruction. I spoke with the charge nurse at [facility] and he will see her in the ER

## 2011-08-18 LAB — BASIC METABOLIC PANEL
BUN: 12 mg/dL (ref 6–23)
CO2: 26 mEq/L (ref 19–32)
Calcium: 8.4 mg/dL (ref 8.4–10.5)
Chloride: 105 mEq/L (ref 96–112)
Creatinine, Ser: 0.7 mg/dL (ref 0.50–1.10)
GFR calc Af Amer: >90 mL/min (ref >90)
GFR calc non Af Amer: 80 mL/min — ABNORMAL LOW (ref >90)
Glucose, Bld: 84 mg/dL (ref 70–99)
Potassium: 3.7 mEq/L (ref 3.5–5.1)
Sodium: 138 mEq/L (ref 135–145)

## 2011-08-18 LAB — CBC
HCT: 38.4 % (ref 36.0–46.0)
Hemoglobin: 12.9 g/dL (ref 12.0–15.0)
MCH: 31.2 pg (ref 26.0–34.0)
MCHC: 33.6 g/dL (ref 30.0–36.0)
MCV: 92.8 fL (ref 78.0–100.0)
Platelets: 215 10*3/uL (ref 150–400)
RBC: 4.14 MIL/uL (ref 3.87–5.11)
RDW: 13.7 % (ref 11.5–15.5)
WBC: 5.9 10*3/uL (ref 4.0–10.5)

## 2011-08-18 LAB — GLUCOSE, CAPILLARY
Glucose-Capillary: 76 mg/dL (ref 70–99)
Glucose-Capillary: 102 mg/dL — ABNORMAL HIGH (ref 70–99)
Glucose-Capillary: 89 mg/dL (ref 70–99)

## 2011-08-18 MED ORDER — SENNA 8.6 MG PO TABS
1.0000 | ORAL_TABLET | Freq: Two times a day (BID) | ORAL | Status: DC
  Administered 2011-08-18: 8.6 mg via ORAL
  Filled 2011-08-18 (×2): qty 1

## 2011-08-18 MED ORDER — MORPHINE SULFATE 2 MG/ML IJ SOLN
2.0000 mg | INTRAMUSCULAR | Status: DC | PRN
  Administered 2011-08-18: 2 mg via INTRAVENOUS
  Filled 2011-08-18: qty 1

## 2011-08-18 MED ORDER — METRONIDAZOLE IN NACL 5-0.79 MG/ML-% IV SOLN
500.0000 mg | Freq: Three times a day (TID) | INTRAVENOUS | Status: DC
  Administered 2011-08-18 – 2011-08-19 (×4): 500 mg via INTRAVENOUS
  Filled 2011-08-18 (×7): qty 100

## 2011-08-18 MED ORDER — SODIUM CHLORIDE 0.9 % IV SOLN
Freq: Once | INTRAVENOUS | Status: AC
  Administered 2011-08-18: 02:00:00 via INTRAVENOUS

## 2011-08-18 MED ORDER — ONDANSETRON HCL 4 MG PO TABS: 4.0000 mg | ORAL_TABLET | Freq: Four times a day (QID) | ORAL | Status: DC | PRN

## 2011-08-18 MED ORDER — SIMVASTATIN 20 MG PO TABS
20.0000 mg | ORAL_TABLET | Freq: Every evening | ORAL | Status: DC
  Filled 2011-08-18 (×2): qty 1

## 2011-08-18 MED ORDER — DOCUSATE SODIUM 100 MG PO CAPS
100.0000 mg | ORAL_CAPSULE | Freq: Two times a day (BID) | ORAL | Status: DC
  Administered 2011-08-18: 100 mg via ORAL
  Filled 2011-08-18 (×4): qty 1

## 2011-08-18 MED ORDER — ACETAMINOPHEN 325 MG PO TABS
650.0000 mg | ORAL_TABLET | Freq: Four times a day (QID) | ORAL | Status: DC | PRN
  Filled 2011-08-18: qty 2

## 2011-08-18 MED ORDER — CIPROFLOXACIN IN D5W 200 MG/100ML IV SOLN
200.0000 mg | Freq: Two times a day (BID) | INTRAVENOUS | Status: DC
  Administered 2011-08-18 (×2): 200 mg via INTRAVENOUS
  Filled 2011-08-18 (×5): qty 100

## 2011-08-18 MED ORDER — ONDANSETRON HCL 4 MG/2ML IJ SOLN: 4.0000 mg | Freq: Four times a day (QID) | INTRAMUSCULAR | Status: DC | PRN

## 2011-08-18 MED ORDER — ACETAMINOPHEN 650 MG RE SUPP: 650.0000 mg | Freq: Four times a day (QID) | RECTAL | Status: DC | PRN

## 2011-08-18 NOTE — Evaluation (Signed)
Physical Therapy Evaluation Patient Details Name: Monica Ward MRN: [id number] DOB: 1929-05-10 Today's Date: 08/18/2011 Time: 5621-3086    PT Assessment / Plan / Recommendation Clinical Impression  pt at baseline for functional mobility; one time eval completed for this very active and independent; pt able to demo most of her home HEP to PT this am from memory;     PT Assessment  Patent does not need any further PT services    Follow Up Recommendations  No PT follow up    Equipment Recommendations  None recommended by PT    Frequency      Precautions / Restrictions Precautions Precautions: None   Pertinent Vitals/Pain       Mobility  Transfers Transfers: Sit to Stand;Stand to Sit Sit to Stand: 7: Independent;Without upper extremity assist;From chair/3-in-1 Stand to Sit: 7: Independent;Without upper extremity assist;To chair/3-in-1 Details for Transfer Assistance: pt able to demo good control of sit to stands x2 from chair without use of hands for support Ambulation/Gait Ambulation/Gait Assistance: 5: Supervision Ambulation Distance (Feet): 220 Feet Assistive device: None Ambulation/Gait Assistance Details: pt able to complete head turns/nods, amb back/fwd without LOB; able to perform unilateral stance on RLEx4sec without UE support Gait Pattern: Within Functional Limits General Gait Details: occasional deviation to right and left (1xeach side) with independent recovery Stairs: Yes Stairs Assistance: 6: Modified independent (Device/Increase time);5: Supervision Stair Management Technique: One rail Right Number of Stairs: 10     Exercises     PT Goals    Visit Information  Last PT Received On: 08/18/11 Assistance Needed: +1    Subjective Data  Subjective: I feel good Patient Stated Goal: return to PLOF, go home   Prior Functioning  Home Living Lives With: Spouse Available Help at Discharge: Family;Available PRN/intermittently Type of Home: House Home  Access: Level entry here; has 3 steps in [facility] Home Layout: One level Home Adaptive Equipment: None Additional Comments: 6mos lives here, 6mos in [facility] Prior Function Level of Independence: Independent Driving: Yes Vocation: Retired Musician: No difficulties    Cognition  Overall Cognitive Status: Appears within functional limits for tasks assessed/performed Arousal/Alertness: Awake/alert Orientation Level: Appears intact for tasks assessed Behavior During Session: [facility] for tasks performed    Extremity/Trunk Assessment Right Upper Extremity Assessment RUE ROM/Strength/Tone: [facility] for tasks assessed Left Upper Extremity Assessment LUE ROM/Strength/Tone: WFL for tasks assessed Right Lower Extremity Assessment RLE ROM/Strength/Tone: [facility] for tasks assessed Left Lower Extremity Assessment LLE ROM/Strength/Tone: [facility] for tasks assessed   Balance High Level Balance High Level Balance Activites: Backward walking;Turns;Direction changes;Sudden stops;Head turns High Level Balance Comments: see gait section for further details  End of Session PT - End of Session Activity Tolerance: Patient tolerated treatment well Patient left: in chair;with call bell/phone within reach;with family/visitor present Nurse Communication: Mobility status   [facility] 08/18/2011, 10:39 AM

## 2011-08-18 NOTE — Progress Notes (Signed)
UR completed. Genella Rife [facility] 08/18/2011

## 2011-08-18 NOTE — Progress Notes (Signed)
Subjective: Patient feels much better. No nausea or vomiting.  Objective:  Intake/Output Summary (Last 24 hours) at 08/18/11 1828 Last data filed at 08/18/11 1425  Gross per 24 hour  Intake    360 ml  Output      0 ml  Net    360 ml    Filed Vitals:   08/18/11 1424  BP: 120/80  Pulse: 80  Temp: 99.3 F (37.4 C)  Resp: 20  General: Patient appears younger than her stated age. Cardiovascular: Regular rate rhythm Lungs: Clear bilaterally Abdomen: Soft mildly tender diffusely positive bowel sounds Extremities no edema  Lab Results: Reviewed    Studies/Results: Dg Chest 2 View  08/17/2011  *RADIOLOGY REPORT*  Clinical Data: Nausea vomiting since 08/16/2011.  CHEST - 2 VIEW  Comparison: None.  Findings: Midline trachea.  Normal heart size and mediastinal contours for age.  No pleural effusion or pneumothorax.  Mild biapical pleural parenchymal scarring.  Lower lobe predominant interstitial thickening is nonspecific. No lobar consolidation.  No free intraperitoneal air.  IMPRESSION:  1. No acute cardiopulmonary disease. 2.  Lower lobe predominant interstitial thickening, nonspecific, especially in this age group.  Correlate with any history of smoking.  Clinically significant discrepancy from primary report, if provided: None  Original Report Authenticated By: Consuello Bossier, M.D.   Ct Abdomen Pelvis W Contrast  08/17/2011  *RADIOLOGY REPORT*  Clinical Data: Abdominal pain.  Elevated white blood cell count.  CT ABDOMEN AND PELVIS WITH CONTRAST  Technique:  Multidetector CT imaging of the abdomen and pelvis was performed following the standard protocol during bolus administration of intravenous contrast.  Contrast: OMNIPAQUE IOHEXOL 300 MG/ML  SOLN  Comparison: CT abdomen and pelvis 09/13/2009.  Findings: Dependent atelectasis is seen in the lung bases.  No pleural or pericardial effusion.  There is a small amount of perihepatic ascites.  Multiple nonsegmental thickened loops of  small bowel are identified centrally in the pelvis and in the right lower quadrant involving both loops of jejunum and ileum.  There is no small bowel obstruction.  No pneumatosis, portal venous gas or free intraperitoneal air is identified.  There is some scattered atherosclerotic vascular disease but the celiac axis and superior and inferior mesenteric arteries appear to opacify with contrast. The colon and stomach appear normal.  There is no bowel obstruction.  The gallbladder, biliary tree, liver, spleen, adrenal glands, pancreas and right kidney are unremarkable.  There is a low attenuation lesion in the lower pole left kidney consistent with a cyst.  No focal bony lesion is identified with lower lumbar degenerative change noted.  IMPRESSION:  1.  Abnormal thickening of multiple loops of small bowel could be due to infectious or inflammatory enteritis.  Ischemic change is within the differential but no secondary signs of ischemia are identified.  There is no bowel obstruction. 2.  Small amount of perihepatic ascites. 3.  Unchanged right renal cyst.  Original Report Authenticated By: Bernadene Bell. Maricela Curet, M.D.   Dg Abd 2 Views  08/17/2011  *RADIOLOGY REPORT*  Clinical Data: Abdominal pain.  Nausea vomiting.  ABDOMEN - 2 VIEW  Comparison: Chest films same date  Findings: Upright and supine views.  Upright view demonstrates no free intraperitoneal air or significant air fluid levels.  A supine view demonstrates no significant gaseous distention of bowel.  Extensive surgical changes in the pelvis.  Numerous phleboliths.  Distal gas and stool.  No abnormal abdominal calcifications.  No pneumatosis.  Mild degenerative changes of the bilateral  hips.  IMPRESSION: No acute findings.  Clinically significant discrepancy from primary report, if provided: None  Original Report Authenticated By: Consuello Bossier, M.D.   Medications: Scheduled Meds:   . sodium chloride   Intravenous Once  . ciprofloxacin  200 mg  Intravenous Q12H  . ciprofloxacin  400 mg Intravenous Once  . docusate sodium  100 mg Oral BID  . metronidazole  500 mg Intravenous Once  . metronidazole  500 mg Intravenous Q8H  .  morphine injection  2 mg Intravenous Once  . senna  1 tablet Oral BID  . simvastatin  20 mg Oral QPM  . sodium chloride  500 mL Intravenous Once  . DISCONTD: sodium chloride   Intravenous STAT   Continuous Infusions:  PRN Meds:.acetaminophen, acetaminophen, morphine, ondansetron (ZOFRAN) IV, ondansetron  Assessment/Plan: Enteritis (08/17/2011) Leukocytosis (08/17/2011) Will continue antibiotics for now. This is most likely related to something she ate. If her symptoms are stable then we'll advance diet and discharge tomorrow. Will continue to monitor. She can followup outpatient with her primary doctor.      LOS: 1 day   Carollee Massed Pager [phone] 08/18/2011, 6:28 PM

## 2011-08-19 LAB — GLUCOSE, CAPILLARY: Glucose-Capillary: 83 mg/dL (ref 70–99)

## 2011-08-19 LAB — COMPREHENSIVE METABOLIC PANEL
ALT: 8 U/L (ref 0–35)
AST: 16 U/L (ref 0–37)
Albumin: 3 g/dL — ABNORMAL LOW (ref 3.5–5.2)
Alkaline Phosphatase: 39 U/L (ref 39–117)
BUN: 8 mg/dL (ref 6–23)
CO2: 26 mEq/L (ref 19–32)
Calcium: 8.5 mg/dL (ref 8.4–10.5)
Chloride: 105 mEq/L (ref 96–112)
Creatinine, Ser: 0.58 mg/dL (ref 0.50–1.10)
GFR calc Af Amer: >90 mL/min (ref >90)
GFR calc non Af Amer: 85 mL/min — ABNORMAL LOW (ref >90)
Glucose, Bld: 87 mg/dL (ref 70–99)
Potassium: 3.5 mEq/L (ref 3.5–5.1)
Sodium: 138 mEq/L (ref 135–145)
Total Bilirubin: 0.4 mg/dL (ref 0.3–1.2)
Total Protein: 5.5 g/dL — ABNORMAL LOW (ref 6.0–8.3)

## 2011-08-19 LAB — CBC
HCT: 36 % (ref 36.0–46.0)
Hemoglobin: 11.7 g/dL — ABNORMAL LOW (ref 12.0–15.0)
MCH: 30.4 pg (ref 26.0–34.0)
MCHC: 32.5 g/dL (ref 30.0–36.0)
MCV: 93.5 fL (ref 78.0–100.0)
Platelets: 204 10*3/uL (ref 150–400)
RBC: 3.85 MIL/uL — ABNORMAL LOW (ref 3.87–5.11)
RDW: 13.6 % (ref 11.5–15.5)
WBC: 5.5 10*3/uL (ref 4.0–10.5)

## 2011-08-19 MED ORDER — METRONIDAZOLE 500 MG PO TABS: 500.0000 mg | ORAL_TABLET | Freq: Two times a day (BID) | ORAL | Status: AC

## 2011-08-19 NOTE — Progress Notes (Signed)
Pt. Refused her IV Flagyl scheduled at 6 AM today stating she had diarrhea and abdominal pain after each administration yesterday and last night. FYI.

## 2011-08-19 NOTE — Discharge Summary (Signed)
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  Pt was last admitted in 08/2009 with abdominal pain and found on CT to have  partial SBO. She resolved quickly with bowel rest and NGT and was discharged  stable.  Pt is reliable historian, states she spends 6 mos in [place] and 6 mos here,  and got back from [facility] on Friday. She ate some cereal that she thought was bad as  it was 74 months old and very hard on Saturday and not long after developed some  abdominal pain, diffusely centered in the middle of the abdomen, with nausea,  but no vomiting or other changes in bowel habits, specifically no diarrhea. She  told her husband the cereal was bad and threw it away. Pt saw PCP Dr. Cleta Alberts for  these complaints on 4/21. He documents 5 surgeries on her abdomen and h/o  malignant tumor in her abdomen, h/o diverticulitis. He did some blood work  which showed elevated WBC count, so sent to ED.    Hospital Course: .Enteritis Patient was admitted with enteritis seen on CT of the abdomen.   She also had leukocytosis because of her nausea and abdominal pain and CT finding she was admitted to the hospital and treated with IV antibiotics.  Over the course of 24 hours her symptoms resolved.  She was discharged on PO Flagyl for 7 days.  She will follow up with her PCP.   Marland KitchenLeukocytosis Secondary to the above.  Resolved prior to discharge.  Medication List  As of 08/19/2011 12:54 PM   TAKE these medications        metroNIDAZOLE 500 MG tablet   Commonly known as: FLAGYL   Take 1 tablet (500 mg total) by mouth 2 (two) times daily before lunch and supper.      mulitivitamin with minerals Tabs   Take 1 tablet by mouth daily.      omega-3 acid ethyl esters 1 G capsule   Commonly known as: LOVAZA   Take 1 g by mouth daily.      simvastatin 20 MG tablet   Commonly known as: ZOCOR   Take 20 mg by mouth every evening.      ZINC 15 PO   Take by mouth daily.             Day of Discharge BP 121/67  Pulse 77  Temp(Src) 97.9 F (36.6 C) (Oral)  Resp 20  Ht 5\' 7"  (1.702 m)  Wt 62.4 kg (137 lb 9.1 oz)  BMI 21.55 kg/m2  SpO2 97%  Physical Exam: General: Patient appears younger than her stated age.  Cardiovascular: Regular rate rhythm  Lungs: Clear bilaterally  Abdomen: Soft mildly tender diffusely positive bowel sounds  Extremities no edema   Results for orders placed during the hospital encounter of 08/17/11 (from the past 24 hour(s))  CBC     Status: Abnormal   Collection Time   08/19/11  5:30 AM      Component Value Range   WBC 5.5  4.0 - 10.5 (K/uL)   RBC 3.85 (*) 3.87 - 5.11 (  MIL/uL)   Hemoglobin 11.7 (*) 12.0 - 15.0 (g/dL)   HCT 16.1  09.6 - 04.5 (%)   MCV 93.5  78.0 - 100.0 (fL)   MCH 30.4  26.0 - 34.0 (pg)   MCHC 32.5  30.0 - 36.0 (g/dL)   RDW 40.9  81.1 - 91.4 (%)   Platelets 204  150 - 400 (K/uL)  COMPREHENSIVE METABOLIC PANEL     Status: Abnormal   Collection Time   08/19/11  5:30 AM      Component Value Range   Sodium 138  135 - 145 (mEq/L)   Potassium 3.5  3.5 - 5.1 (mEq/L)   Chloride 105  96 - 112 (mEq/L)   CO2 26  19 - 32 (mEq/L)   Glucose, Bld 87  70 - 99 (mg/dL)   BUN 8  6 - 23 (mg/dL)   Creatinine, Ser 7.82  0.50 - 1.10 (mg/dL)   Calcium 8.5  8.4 - 95.6 (mg/dL)   Total Protein 5.5 (*) 6.0 - 8.3 (g/dL)   Albumin 3.0 (*) 3.5 - 5.2 (g/dL)   AST 16  0 - 37 (U/L)   ALT 8  0 - 35 (U/L)   Alkaline Phosphatase 39  39 - 117 (U/L)   Total Bilirubin 0.4  0.3 - 1.2  (mg/dL)   GFR calc non Af Amer 85 (*) >90 (mL/min)   GFR calc Af Amer >90  >90 (mL/min)  GLUCOSE, CAPILLARY     Status: Normal   Collection Time   08/19/11  7:58 AM      Component Value Range   Glucose-Capillary 83  70 - 99 (mg/dL)   Comment 1 Notify RN      Disposition:Home  Follow-up Appts: Discharge Orders    Future Appointments: Provider: Department: Dept Phone: Center:   10/28/2011 8:45 AM Collene Gobble, MD [facility] [phone] [facility]     Future Orders Please Complete By Expires   Diet general         Follow-up Information    Follow up with Primary Doctor in 1 week.         Tests Needing Follow-up: CT abdomen  Time spent in discharge (includes decision making & examination of pt): 35 minutes  Signed: Carollee Massed Pager [phone] 08/19/2011, 12:54 PM

## 2011-10-28 ENCOUNTER — Encounter: Payer: Self-pay | Admitting: Emergency Medicine

## 2011-10-28 ENCOUNTER — Ambulatory Visit (INDEPENDENT_AMBULATORY_CARE_PROVIDER_SITE_OTHER): Payer: Medicare Other | Admitting: Emergency Medicine

## 2011-10-28 VITALS — BP 115/68 | HR 62 | Temp 97.6°F | Resp 16 | Ht 67.0 in | Wt 125.6 lb

## 2011-10-28 DIAGNOSIS — M810 Age-related osteoporosis without current pathological fracture: Secondary | ICD-10-CM

## 2011-10-28 DIAGNOSIS — C499 Malignant neoplasm of connective and soft tissue, unspecified: Secondary | ICD-10-CM

## 2011-10-28 DIAGNOSIS — C779 Secondary and unspecified malignant neoplasm of lymph node, unspecified: Secondary | ICD-10-CM

## 2011-10-28 DIAGNOSIS — Z0000 Encounter for general adult medical examination without abnormal findings: Secondary | ICD-10-CM

## 2011-10-28 DIAGNOSIS — C50919 Malignant neoplasm of unspecified site of unspecified female breast: Secondary | ICD-10-CM

## 2011-10-28 HISTORY — DX: Malignant neoplasm of connective and soft tissue, unspecified: C49.9

## 2011-10-28 LAB — POCT UA - MICROSCOPIC ONLY
Bacteria, U Microscopic: NEGATIVE
Casts, Ur, LPF, POC: NEGATIVE
Crystals, Ur, HPF, POC: NEGATIVE
Yeast, UA: NEGATIVE

## 2011-10-28 LAB — POCT URINALYSIS DIPSTICK
Bilirubin, UA: NEGATIVE
Glucose, UA: NEGATIVE
Ketones, UA: NEGATIVE
Nitrite, UA: NEGATIVE
Protein, UA: NEGATIVE
Spec Grav, UA: 1.02
Urobilinogen, UA: 0.2
pH, UA: 6.5

## 2011-10-28 LAB — CBC WITH DIFFERENTIAL/PLATELET
Basophils Absolute: 0 10*3/uL (ref 0.0–0.1)
Basophils Relative: 0 % (ref 0–1)
Eosinophils Absolute: 0.1 10*3/uL (ref 0.0–0.7)
Eosinophils Relative: 2 % (ref 0–5)
HCT: 41.7 % (ref 36.0–46.0)
Hemoglobin: 14.4 g/dL (ref 12.0–15.0)
Lymphocytes Relative: 29 % (ref 12–46)
Lymphs Abs: 1.5 10*3/uL (ref 0.7–4.0)
MCH: 31.4 pg (ref 26.0–34.0)
MCHC: 34.5 g/dL (ref 30.0–36.0)
MCV: 91 fL (ref 78.0–100.0)
Monocytes Absolute: 0.5 10*3/uL (ref 0.1–1.0)
Monocytes Relative: 10 % (ref 3–12)
Neutro Abs: 3 10*3/uL (ref 1.7–7.7)
Neutrophils Relative %: 59 % (ref 43–77)
Platelets: 259 10*3/uL (ref 150–400)
RBC: 4.58 MIL/uL (ref 3.87–5.11)
RDW: 13.3 % (ref 11.5–15.5)
WBC: 5.1 10*3/uL (ref 4.0–10.5)

## 2011-10-28 NOTE — Progress Notes (Signed)
  Subjective:    Patient ID: Monica Ward, female    DOB: [birth year], 78 y.o.   MRN: [id number]  HPI    Review of Systems  Constitutional: Negative.   HENT: Negative.   Eyes: Negative.   Respiratory: Negative.   Cardiovascular: Negative.   Gastrointestinal: Negative.   Genitourinary: Negative.   Musculoskeletal: Negative.   Skin: Negative.   Neurological: Negative.   Hematological: Negative.   Psychiatric/Behavioral: Negative.        Objective:   Physical Exam HEENT exam is within normal limits. Neck is supple chest is clear to auscultation and percussion. There are no carotid bruits heard cardiac exam is regular rate without murmurs. 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 She's had recurrent episodes of small bowel obstruction since that time. She is very aware of this and knows to treat her constipation emergently as soon as possible. There was concern at this time whether this was a spindle cell cancer or a true leiomyosarcoma. From reviewing the chart I am unable to discern which path report is accurate

## 2011-10-28 NOTE — Patient Instructions (Signed)
Please schedule your mammogram and bone density study

## 2011-10-28 NOTE — Progress Notes (Signed)
  Subjective:    Patient ID: Monica Ward, female    DOB: Mar 14, 1930, 78 y.o.   MRN: [id number]  HPI    Review of Systems     Objective:   Physical Exam        Assessment & Plan:

## 2011-10-29 LAB — COMPREHENSIVE METABOLIC PANEL
ALT: 15 U/L (ref 0–35)
AST: 20 U/L (ref 0–37)
Albumin: 4.5 g/dL (ref 3.5–5.2)
Alkaline Phosphatase: 50 U/L (ref 39–117)
BUN: 17 mg/dL (ref 6–23)
CO2: 28 mEq/L (ref 19–32)
Calcium: 9.7 mg/dL (ref 8.4–10.5)
Chloride: 105 mEq/L (ref 96–112)
Creat: 0.65 mg/dL (ref 0.50–1.10)
Glucose, Bld: 85 mg/dL (ref 70–99)
Potassium: 4.5 mEq/L (ref 3.5–5.3)
Sodium: 142 mEq/L (ref 135–145)
Total Bilirubin: 0.6 mg/dL (ref 0.3–1.2)
Total Protein: 7.1 g/dL (ref 6.0–8.3)

## 2011-10-29 LAB — LIPID PANEL
Cholesterol: 201 mg/dL — ABNORMAL HIGH (ref 0–200)
HDL: 62 mg/dL (ref >39)
LDL Cholesterol: 123 mg/dL — ABNORMAL HIGH (ref 0–99)
Total CHOL/HDL Ratio: 3.2 Ratio
Triglycerides: 81 mg/dL (ref <150)
VLDL: 16 mg/dL (ref 0–40)

## 2011-10-29 LAB — TSH: TSH: 2.473 u[IU]/mL (ref 0.350–4.500)

## 2011-10-29 LAB — VITAMIN D 25 HYDROXY (VIT D DEFICIENCY, FRACTURES): Vit D, 25-Hydroxy: 39 ng/mL (ref 30–89)

## 2012-01-20 ENCOUNTER — Telehealth: Payer: Self-pay | Admitting: Radiology

## 2012-01-20 ENCOUNTER — Other Ambulatory Visit: Payer: Self-pay | Admitting: Radiology

## 2012-01-20 MED ORDER — PRAVASTATIN SODIUM 20 MG PO TABS: 20.0000 mg | ORAL_TABLET | Freq: Every day | ORAL | Status: DC

## 2012-01-21 NOTE — Telephone Encounter (Signed)
Patient advised this is sent for her.  

## 2012-01-27 ENCOUNTER — Telehealth: Payer: Self-pay

## 2012-01-27 MED ORDER — PRAVASTATIN SODIUM 20 MG PO TABS: 20.0000 mg | ORAL_TABLET | Freq: Every day | ORAL | Status: DC

## 2012-01-27 NOTE — Telephone Encounter (Signed)
Called patient to advise I can do this for her, she was here for her physical in July

## 2012-01-27 NOTE — Telephone Encounter (Signed)
Patient states that she is needing to get her pravastatin for 3 months til her next appointment.

## 2012-01-28 ENCOUNTER — Encounter (HOSPITAL_COMMUNITY): Payer: Self-pay | Admitting: Cardiology

## 2012-01-28 ENCOUNTER — Ambulatory Visit (INDEPENDENT_AMBULATORY_CARE_PROVIDER_SITE_OTHER): Payer: Medicare Other | Admitting: Emergency Medicine

## 2012-01-28 ENCOUNTER — Emergency Department (HOSPITAL_COMMUNITY): Payer: Medicare Other

## 2012-01-28 ENCOUNTER — Observation Stay (HOSPITAL_COMMUNITY)
Admission: EM | Admit: 2012-01-28 | Discharge: 2012-01-29 | DRG: 206 | Disposition: A | Discharge status: Home / Self Care | Payer: Medicare Other | Attending: Family Medicine | Admitting: Family Medicine

## 2012-01-28 VITALS — BP 153/74 | HR 66 | Temp 97.3°F | Resp 17 | Ht 68.0 in | Wt 126.0 lb

## 2012-01-28 DIAGNOSIS — R0789 Other chest pain: Secondary | ICD-10-CM | POA: Diagnosis present

## 2012-01-28 DIAGNOSIS — M940 Chondrocostal junction syndrome [Tietze]: Principal | ICD-10-CM | POA: Diagnosis present

## 2012-01-28 DIAGNOSIS — Z7982 Long term (current) use of aspirin: Secondary | ICD-10-CM

## 2012-01-28 DIAGNOSIS — R079 Chest pain, unspecified: Secondary | ICD-10-CM

## 2012-01-28 DIAGNOSIS — E785 Hyperlipidemia, unspecified: Secondary | ICD-10-CM | POA: Diagnosis present

## 2012-01-28 DIAGNOSIS — I498 Other specified cardiac arrhythmias: Secondary | ICD-10-CM | POA: Diagnosis present

## 2012-01-28 DIAGNOSIS — E7800 Pure hypercholesterolemia, unspecified: Secondary | ICD-10-CM | POA: Diagnosis present

## 2012-01-28 DIAGNOSIS — Z79899 Other long term (current) drug therapy: Secondary | ICD-10-CM

## 2012-01-28 HISTORY — DX: Adverse effect of unspecified anesthetic, initial encounter: T41.45XA

## 2012-01-28 HISTORY — DX: Unspecified osteoarthritis, unspecified site: M19.90

## 2012-01-28 HISTORY — DX: Other complications of anesthesia, initial encounter: T88.59XA

## 2012-01-28 LAB — APTT: aPTT: 30 seconds (ref 24–37)

## 2012-01-28 LAB — CBC
HCT: 39.1 % (ref 36.0–46.0)
HCT: 37 % (ref 36.0–46.0)
Hemoglobin: 13.1 g/dL (ref 12.0–15.0)
Hemoglobin: 12.2 g/dL (ref 12.0–15.0)
MCH: 30.9 pg (ref 26.0–34.0)
MCH: 30.5 pg (ref 26.0–34.0)
MCHC: 33.5 g/dL (ref 30.0–36.0)
MCHC: 33 g/dL (ref 30.0–36.0)
MCV: 92.2 fL (ref 78.0–100.0)
MCV: 92.5 fL (ref 78.0–100.0)
Platelets: 223 10*3/uL (ref 150–400)
Platelets: 213 10*3/uL (ref 150–400)
RBC: 4.24 MIL/uL (ref 3.87–5.11)
RBC: 4 MIL/uL (ref 3.87–5.11)
RDW: 13.8 % (ref 11.5–15.5)
RDW: 13.8 % (ref 11.5–15.5)
WBC: 5.3 10*3/uL (ref 4.0–10.5)
WBC: 5.2 10*3/uL (ref 4.0–10.5)

## 2012-01-28 LAB — TROPONIN I: Troponin I: <0.3 ng/mL (ref <0.30)

## 2012-01-28 LAB — COMPREHENSIVE METABOLIC PANEL
ALT: 13 U/L (ref 0–35)
AST: 19 U/L (ref 0–37)
Albumin: 3.8 g/dL (ref 3.5–5.2)
Alkaline Phosphatase: 57 U/L (ref 39–117)
BUN: 15 mg/dL (ref 6–23)
CO2: 28 mEq/L (ref 19–32)
Calcium: 9.4 mg/dL (ref 8.4–10.5)
Chloride: 105 mEq/L (ref 96–112)
Creatinine, Ser: 0.64 mg/dL (ref 0.50–1.10)
GFR calc Af Amer: >90 mL/min (ref >90)
GFR calc non Af Amer: 82 mL/min — ABNORMAL LOW (ref >90)
Glucose, Bld: 83 mg/dL (ref 70–99)
Potassium: 4.3 mEq/L (ref 3.5–5.1)
Sodium: 141 mEq/L (ref 135–145)
Total Bilirubin: 0.3 mg/dL (ref 0.3–1.2)
Total Protein: 6.9 g/dL (ref 6.0–8.3)

## 2012-01-28 LAB — PROTIME-INR
INR: 1 (ref 0.00–1.49)
Prothrombin Time: 13.1 seconds (ref 11.6–15.2)

## 2012-01-28 LAB — CREATININE, SERUM
Creatinine, Ser: 0.63 mg/dL (ref 0.50–1.10)
GFR calc Af Amer: >90 mL/min (ref >90)
GFR calc non Af Amer: 82 mL/min — ABNORMAL LOW (ref >90)

## 2012-01-28 LAB — POCT I-STAT TROPONIN I: Troponin i, poc: 0 ng/mL (ref 0.00–0.08)

## 2012-01-28 MED ORDER — ASPIRIN 81 MG PO CHEW: 324.0000 mg | CHEWABLE_TABLET | ORAL | Status: DC

## 2012-01-28 MED ORDER — HEPARIN SODIUM (PORCINE) 5000 UNIT/ML IJ SOLN
5000.0000 [IU] | Freq: Three times a day (TID) | INTRAMUSCULAR | Status: DC
  Filled 2012-01-28 (×7): qty 1

## 2012-01-28 MED ORDER — ASPIRIN EC 81 MG PO TBEC
81.0000 mg | DELAYED_RELEASE_TABLET | Freq: Every day | ORAL | Status: DC
  Administered 2012-01-29: 81 mg via ORAL
  Filled 2012-01-28: qty 1

## 2012-01-28 MED ORDER — ASPIRIN 300 MG RE SUPP
300.0000 mg | RECTAL | Status: DC
  Filled 2012-01-28: qty 1

## 2012-01-28 MED ORDER — ONDANSETRON HCL 4 MG/2ML IJ SOLN: 4.0000 mg | Freq: Four times a day (QID) | INTRAMUSCULAR | Status: DC | PRN

## 2012-01-28 MED ORDER — ACETAMINOPHEN 325 MG PO TABS
650.0000 mg | ORAL_TABLET | ORAL | Status: DC | PRN
  Administered 2012-01-28 – 2012-01-29 (×2): 650 mg via ORAL
  Filled 2012-01-28 (×2): qty 2

## 2012-01-28 MED ORDER — NITROGLYCERIN 0.4 MG SL SUBL: 0.4000 mg | SUBLINGUAL_TABLET | SUBLINGUAL | Status: DC | PRN

## 2012-01-28 MED ORDER — SIMVASTATIN 20 MG PO TABS
20.0000 mg | ORAL_TABLET | Freq: Every evening | ORAL | Status: DC
  Administered 2012-01-28: 20 mg via ORAL
  Filled 2012-01-28 (×2): qty 1

## 2012-01-28 MED ORDER — NITROGLYCERIN 2 % TD OINT
1.0000 [in_us] | TOPICAL_OINTMENT | Freq: Once | TRANSDERMAL | Status: AC
  Administered 2012-01-28: 1 [in_us] via TOPICAL
  Filled 2012-01-28: qty 1

## 2012-01-28 MED ORDER — ASPIRIN 81 MG PO CHEW
324.0000 mg | CHEWABLE_TABLET | Freq: Once | ORAL | Status: AC
  Administered 2012-01-28: 324 mg via ORAL

## 2012-01-28 MED ORDER — SODIUM CHLORIDE 0.9 % IV SOLN
1000.0000 mL | INTRAVENOUS | Status: DC
  Administered 2012-01-28: 1000 mL via INTRAVENOUS

## 2012-01-28 MED ORDER — OMEGA-3-ACID ETHYL ESTERS 1 G PO CAPS
1.0000 g | ORAL_CAPSULE | Freq: Every day | ORAL | Status: DC
  Filled 2012-01-28 (×2): qty 1

## 2012-01-28 NOTE — ED Provider Notes (Signed)
History     CSN: [id number] Arrival date & time 01/28/12  1241  First MD Initiated Contact with Patient 01/28/12 1243      Chief Complaint  Patient presents with  . Chest Pain    HPI Comments: The pain was sharp and aching in nature.  She was not able to sleep because of the pain.  Patient is a 78 y.o. female presenting with chest pain. The history is provided by the patient.  Chest Pain Episode onset: started at 4am. Episode Length: it has been waxing and waning but has not resolved. The chest pain is resolved. The severity of the pain is severe. The pain does not radiate. Exacerbated by: worsened with palpation. Pertinent negatives for primary symptoms include no fever, no syncope, no shortness of breath, no nausea and no vomiting. She tried nothing for the symptoms.  Pertinent negatives for past medical history include no DVT, no MI and no PE.  Her family medical history is significant for CAD in family (father, age 19).     Past Medical History  Diagnosis Date  . Cancer Around 2002-2003    Unclear history of "7 lb" abdominal tumor treated at [facility] ~2003 s/p open abdominal colonic resection and radiation  . High cholesterol   . SBO (small bowel obstruction) 08/2009    Early, partial SBO improved with conservative Tx   . Diverticulitis     Per chart     Past Surgical History  Procedure Date  . Cesarean section   . Appendectomy   . Abdominal surgery   . Tumor removal     last tumor was cancer  . Abdominal hysterectomy 2006    Family History  Problem Relation Age of Onset  . Heart failure Father     History  Substance Use Topics  . Smoking status: Never Smoker   . Smokeless tobacco: Never Used  . Alcohol Use: Yes     social    OB History    Grav Para Term Preterm Abortions TAB SAB Ect Mult Living                  Review of Systems  Constitutional: Negative for fever.  Respiratory: Negative for shortness of breath.   Cardiovascular: Positive for chest  pain. Negative for syncope.  Gastrointestinal: Negative for nausea and vomiting.    Allergies  Review of patient's allergies indicates no known allergies.  Home Medications   Current Outpatient Rx  Name Route Sig Dispense Refill  . ADULT MULTIVITAMIN W/MINERALS CH Oral Take 1 tablet by mouth daily.    . OMEGA-3-ACID ETHYL ESTERS 1 G PO CAPS Oral Take 1 g by mouth daily.    Marland Kitchen PRAVASTATIN SODIUM 20 MG PO TABS Oral Take 1 tablet (20 mg total) by mouth daily. 90 tablet 1  . SIMVASTATIN 20 MG PO TABS Oral Take 20 mg by mouth every evening.    Marland Kitchen ZINC 15 PO Oral Take by mouth daily.      BP 150/74  Pulse 50  Temp 98.2 F (36.8 C) (Oral)  Resp 18  SpO2 100%  Physical Exam  Nursing note and vitals reviewed. Constitutional: She appears well-developed and well-nourished. No distress.  HENT:  Head: Normocephalic and atraumatic.  Right Ear: External ear normal.  Left Ear: External ear normal.  Eyes: Conjunctivae normal are normal. Right eye exhibits no discharge. Left eye exhibits no discharge. No scleral icterus.  Neck: Neck supple. No tracheal deviation present.  Cardiovascular: Normal rate, regular  rhythm and intact distal pulses.   Pulmonary/Chest: Effort normal and breath sounds normal. No stridor. No respiratory distress. She has no wheezes. She has no rales.  Abdominal: Soft. Bowel sounds are normal. She exhibits no distension. There is no tenderness. There is no rebound and no guarding.  Musculoskeletal: She exhibits no edema and no tenderness.  Neurological: She is alert. She has normal strength. No sensory deficit. Cranial nerve deficit:  no gross defecits noted. She exhibits normal muscle tone. She displays no seizure activity. Coordination normal.  Skin: Skin is warm and dry. No rash noted.  Psychiatric: She has a normal mood and affect.    ED Course  Procedures (including critical care time) EKG Rate 56, nl axis SINUS RHYTHM ~ normal P axis, V-rate 50- 99 BORDERLINE  INFERIOR Q WAVES ~ Qs add to 80 mS in II III aVF NONSPECIFIC T ABNORMALITIES, ANT-LAT LEADS ~ T <-0.49mV, I aVL V2-V6    Labs Reviewed  COMPREHENSIVE METABOLIC PANEL - Abnormal; Notable for the following:    GFR calc non Af Amer 82 (*)     All other components within normal limits  CBC  PROTIME-INR  APTT  POCT I-STAT TROPONIN I   Dg Chest Portable 1 View  01/28/2012  *RADIOLOGY REPORT*  Clinical Data: Chest pain.  PORTABLE CHEST - 1 VIEW  Comparison: 08/17/2011  Findings: There is hyperinflation of the lungs compatible with COPD.  Heart and mediastinal contours are within normal limits.  No focal opacities or effusions.  No acute bony abnormality.  IMPRESSION: COPD.  No active disease.   Original Report Authenticated By: Cyndie Chime, M.D.     MDM  Pt with chest pain.  Symptoms could be anginal in nature.  Considering her age, family history and hypercholesterolemia, will consult regarding admission and stress testing.       Celene Kras, MD 01/28/12 [phone]

## 2012-01-28 NOTE — H&P (Signed)
Family Medicine Teaching [facility] Admission History and Physical  Patient name: Monica Ward Medical record number: [id number] Date of birth: Feb 13, 1930 Age: 78 y.o. Gender: female  Primary Care Provider: Lucilla Edin, MD of [facility]  Chief Complaint: Chest Pain  History of Present Illness: Monica Ward is a 78 y.o. female presenting with chest pain.  Patient states that this morning at 4:30 am she was woken up by a sharp left sided pain in her chest. She states that she took 2 ASA 81mg  tabs which helped to alleviate the pain and then she laid back down. She states that the pain initially lasted less than an hour but then the pain returned when she woke up at 8:30. She states that the pain is located to the left of her sternum and rates it as 7/10. The patient went to her PCP today where she continued to have the pain. She states that the pain started to come and go but was easily reproduced when her PCP palpated her chest wall and described it as a sharp stabbing knife like pain and similar to the pain that woke her up this morning.   Patient states that the pain is not associated with exertion. It is made worse by palpation of the chest wall and possibly with eating. It is alleviated by not moving as sometimes turning or bending can make pain worse but not particularly associated with rest from exertion. She also tried taking Gas X for the pain but says that it did no affect the pain.  In the ED she received a nitro patch but at the time that she received the patch she was pain free she states. She was chest pain free when we entered the room but later developed 1-2/10 pain as she said she recently ate something before we came in the room.   Patient denies any shortness of breath, diaphroesis, radiation of the pain to neck arm or back, nausea, or vomitting. Patient denies any orthopnea, PND, or swelling of her legs. 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   Patient Active Problem List  Diagnosis  . Leiomyosarcoma   Past Medical History: Past Medical History  Diagnosis Date  . Cancer Around 2002-2003    Unclear history of "7 lb" abdominal tumor treated at [facility] ~2003 s/p open abdominal colonic resection and radiation  . High cholesterol   . SBO (small bowel obstruction) 08/2009    Early, partial SBO improved with conservative Tx   . Diverticulitis     Per chart    Past Surgical History: Past Surgical History  Procedure Date  . Cesarean section   . Appendectomy   . Abdominal surgery   . Tumor removal     last tumor was cancer  . Abdominal hysterectomy 2006   Social History: History   Social History  . Marital Status: Married    Spouse Name: N/A    Number of Children: N/A  . Years of Education: N/A   Social History Main Topics  . Smoking status: Never Smoker   . Smokeless tobacco: Never Used  . Alcohol Use: Yes     social  . Drug Use: No  . Sexually Active: None   Other Topics Concern  . None   Social History Narrative   She lives at home with her husband and has  3 kids. She is still very active, ambulatory, goes bike riding. Spends half the year in [place] and half the year in [place]. Social smoker previously but not heavy, not current smoker.     Family History: Family History  Problem Relation Age of Onset  . Heart failure Father    Father died of heart attack at age 26  Allergies: No Known Allergies Current Facility-Administered Medications  Medication Dose Route Frequency Provider Last Rate Last Dose  . 0.9 %  sodium chloride infusion  1,000 mL Intravenous Continuous Celene Kras, MD 20 mL/hr at 01/28/12 1339 1,000 mL at 01/28/12 1339  . aspirin chewable tablet 324 mg  324 mg Oral Once Celene Kras, MD   324 mg at 01/28/12 1319  . nitroGLYCERIN (NITROGLYN) 2 % ointment 1 inch   1 inch Topical Once Celene Kras, MD   1 inch at 01/28/12 1338   Current Outpatient Prescriptions  Medication Sig Dispense Refill  . aspirin 81 MG chewable tablet Chew 81 mg by mouth once. Patient took 2 tablets at 0400 this morning and an additional 2 tablets prior to coming to the ED.      . Multiple Vitamin (MULITIVITAMIN WITH MINERALS) TABS Take 1 tablet by mouth daily.      . Multiple Vitamins-Minerals (ZINC PO) Take 1 tablet by mouth daily.      Marland Kitchen omega-3 acid ethyl esters (LOVAZA) 1 G capsule Take 1 g by mouth daily.      . Simethicone (GAS-X PO) Take 1 tablet by mouth once as needed. As needed for gas.      . simvastatin (ZOCOR) 20 MG tablet Take 20 mg by mouth every evening.      . pravastatin (PRAVACHOL) 20 MG tablet Take 1 tablet (20 mg total) by mouth daily.  90 tablet  1    Review Of Systems: See HPI Otherwise 12 point review of systems was performed and was unremarkable.  Physical Exam: Filed Vitals:   01/28/12 1445  BP: 125/55  Pulse: 56  Temp:   Resp: 18   Gen:  Laying in bed in NAD HEENT: Moist mucous membranes. No scleral icterus.  CV: Regular rate and rhythm, no murmurs rubs or gallops PULM: Clear to auscultation bilaterally. No wheezes/rales/rhonchi ABD: Soft, non tender, non distended, normal bowel sounds. Midline scar present. EXT: No edema MSK: chest wall tender to palpation at the costosternal at the lower portion of the sternum. Neuro: Alert and oriented x3. No motor or sensory deficits.   Labs and Imaging:  Results for orders placed during the hospital encounter of 01/28/12 (from the past 24 hour(s))  CBC     Status: Normal   Collection Time   01/28/12  1:29 PM      Component Value Range   WBC 5.3  4.0 - 10.5 K/uL   RBC 4.24  3.87 - 5.11 MIL/uL   Hemoglobin 13.1  12.0 - 15.0 g/dL   HCT 16.1  09.6 - 04.5 %   MCV 92.2  78.0 - 100.0 fL   MCH 30.9  26.0 - 34.0 pg   MCHC 33.5  30.0 - 36.0 g/dL   RDW 40.9  81.1 - 91.4 %   Platelets 223  150 - 400 K/uL   COMPREHENSIVE METABOLIC PANEL     Status: Abnormal   Collection Time   01/28/12  1:29 PM      Component Value Range   Sodium 141  135 - 145 mEq/L   Potassium  4.3  3.5 - 5.1 mEq/L   Chloride 105  96 - 112 mEq/L   CO2 28  19 - 32 mEq/L   Glucose, Bld 83  70 - 99 mg/dL   BUN 15  6 - 23 mg/dL   Creatinine, Ser 1.19  0.50 - 1.10 mg/dL   Calcium 9.4  8.4 - 14.7 mg/dL   Total Protein 6.9  6.0 - 8.3 g/dL   Albumin 3.8  3.5 - 5.2 g/dL   AST 19  0 - 37 U/L   ALT 13  0 - 35 U/L   Alkaline Phosphatase 57  39 - 117 U/L   Total Bilirubin 0.3  0.3 - 1.2 mg/dL   GFR calc non Af Amer 82 (*) >90 mL/min   GFR calc Af Amer >90  >90 mL/min  PROTIME-INR     Status: Normal   Collection Time   01/28/12  1:29 PM      Component Value Range   Prothrombin Time 13.1  11.6 - 15.2 seconds   INR 1.00  0.00 - 1.49  APTT     Status: Normal   Collection Time   01/28/12  1:29 PM      Component Value Range   aPTT 30  24 - 37 seconds  POCT I-STAT TROPONIN I     Status: Normal   Collection Time   01/28/12  1:57 PM      Component Value Range   Troponin i, poc 0.00  0.00 - 0.08 ng/mL   Comment 3             Lipid Profile (10/28/11) Cholesterol 201  Triglycerides 81  HDL 62  LDL 123  VLDL 16    TSH (10/28/11) 2.473 Assessment and Plan: Monica Ward is a 78 y.o.  female presenting with  1. Chest Pain: The patient has 2 cardiac risk factors (hypercholesterolemia and FH) and a TIMI score of 2 (age > 75 and ASA use in the past 7 days). However, the fact that her chest pain is easily reproducible on exam with palpation of the costosternal junction, it is a sharp pain and is not associated with exertion, and it was not alleviated with nitro makes ACS much less likely. Her EKG did show some T wave abnormalities in v1 and v2 however this appears unchanged from her previous EKG. This pain is most likely costochondritis. However, ACS must be ruled out as a cause. PE is also less likely as she is sating well on RA with no  shortness of breath and a Wells score of 0. Aortic dissection is also less likely as she does not have hypertension, her pulses are equal bilaterally, and she is hemodynamically stable. -- Cycle troponins x 3  -- telemetry -- am EKG -- Check Hba1c, hold on TSH as recently performed -- If pain returns, consider acetaminophen 325mg  q 4-6 hours -- AM EKG  2. Hypercholesterolemia: Patient had lipid profile in July 2013 which revealed LDL of 123. She was started on Simvastatin 20mg  qhs. -- Continue home Simvastatin 20mg  qhs -- recheck lipids in AM  3. FEN/GI: Normal diet  4. Prophylaxis: Heparin SQ  5. Disposition: admit with telemetry   Regino Bellow MS IV  Family Medicine Upper Level Addendum:   I have seen and examined the patient independently, discussed with Student Doctor Regino Bellow, fully reviewed the H+P and agree with it's contents with the additions as noted in blue text. My independent exam is below.   Subjective:  per med student note  BP 125/55  Pulse 56  Temp 98.2 F (36.8 C) (Oral)  Resp 18  SpO2 99% Gen: NAD, resting comfortably in bed HEENT: NCAT, MMM, PERRLA  CV: RRR no mrg  Lungs: CTAB  Abd: soft/nontender/nondistended/normal bowel sounds. Surgical scar present at midline MSK: moves all extremities, no edema, tenderness to palpation at junction of ribs and sternum on lower portion of sternum. Patient actually guards due to severity of pain.  Skin: warm and dry, no rash  Neuro: alert and oriented, grossly normal.   Assessment and plan:  78 year old female with history of hyperlipidemia presenting with chest pain.   Chest pain-pain noncardiac in nature (quality not anginal-sharp adn to left of sternum, not provoked by exertion or stress, not relieved by rest or nitroglycerin) likely MSK in nature given tenderness to palpation on exam. Due to risk factors and fact that presentation not always typical in women will rule out ACS with cardiac enzymes due to age  and hyperlipidemia and get repeat AM EKG. Equal pulses in arm and hemodynamically stable so not likely dissection. PE unlikely given low wells score.   -active chest pain: minimal 1/10   -TIMI score: 2 (age and aspirin only), Wells criteria: 0    -cards consult: defer for now, will consult if troponins positive or consider on outpatient basis   -admit to telemetry bed, observation status  -EKG: slightly bradycardic t wave inversions in v1, v2 with previous v1 inversion noted  -CE x2   -Risk stratification labs: pending a1c,  fasting lipids (could consider increasing statin possibly), defer TSH  -Meds: continue ASA, no current need for beta blocker  -will allow patient to eat    Tana Conch, MD, PGY2 01/28/2012 4:44 PM

## 2012-01-28 NOTE — ED Notes (Signed)
Pt to department via EMS from [facility]- reports that she woke up this morning with left-sided chest pain. Pt went to PCP office and was sent here for further evaluation. Pain free at this time. 324 asa PTA. Bp- 136/57 Hr-54.

## 2012-01-28 NOTE — Progress Notes (Signed)
  Subjective:    Patient ID: Monica Ward, female    DOB: [birth year], 78 y.o.   MRN: [id number]  HPI patient presents with onset this morning at about 4:30 of severe left-sided chest pain. The pain is sharp in nature and seems to come and go. She has not had any shortness of breath diaphoresis nausea. She has not had any radiation into her back or neck. She has no history of cardiac disease. She is currently on cholesterol medications but no other medications. She took 2 aspirin at 4:30 when she had the pain    Review of Systems     Objective:   Physical Exam physical exam reveals an alert female who is not in distress her neck is supple. Chest is clear to auscultation and percussion. Cardiac exam reveals a regular rate and rhythm without murmurs rubs or gallops. The abdomen has a large midline scar without tenderness. There is exquisite tenderness over the left mid to lower costal margin at the attachment of the sixth seventh and eighth ribs to the sternum.  EKG shows poor R-wave progression V1 to V3 raising the possibility of a previous septal infarct. There are no acute changes.      Assessment & Plan:  Physical exam and history would say her pain is musculoskeletal in origin. I do feel like considering her age and high cholesterol enzymes would be indicated. Her exam was say her pain is musculoskeletal in origen.

## 2012-01-29 DIAGNOSIS — R0789 Other chest pain: Secondary | ICD-10-CM | POA: Diagnosis present

## 2012-01-29 LAB — LIPID PANEL
Cholesterol: 182 mg/dL (ref 0–200)
HDL: 62 mg/dL (ref >39)
LDL Cholesterol: 98 mg/dL (ref 0–99)
Total CHOL/HDL Ratio: 2.9 RATIO
Triglycerides: 109 mg/dL (ref <150)
VLDL: 22 mg/dL (ref 0–40)

## 2012-01-29 LAB — HEMOGLOBIN A1C
Hgb A1c MFr Bld: 5.8 % — ABNORMAL HIGH (ref <5.7)
Mean Plasma Glucose: 120 mg/dL — ABNORMAL HIGH (ref <117)

## 2012-01-29 LAB — TROPONIN I: Troponin I: <0.3 ng/mL (ref <0.30)

## 2012-01-29 MED ORDER — NITROGLYCERIN 0.4 MG SL SUBL: 0.4000 mg | SUBLINGUAL_TABLET | SUBLINGUAL | Status: DC | PRN

## 2012-01-29 NOTE — Discharge Summary (Signed)
Family Medicine Teaching Service  Discharge Note : Attending Jeff Walden MD Pager [phone] Inpatient Team Pager:  [phone]  I have seen and examined this patient, reviewed their chart and discussed discharge planning with the resident at the time of discharge. I agree with the discharge plan as above.  

## 2012-01-29 NOTE — Progress Notes (Signed)
Pt given d/c instructions; pt verbalized understanding; IV and tele monitor d/c per MD order; pt to d/c home with husband; pt dressing at this time; husband to be here shortly; will cont. To monitor.

## 2012-01-29 NOTE — Progress Notes (Signed)
When patient is awake H.R. S.B. In the 50's When asleep  H.R. Will drop down to the upper 30"s and come back up to 50's cont. To monitor patient and rhythm.

## 2012-01-29 NOTE — H&P (Signed)
FMTS Attending Admission Note: Renold Don MD Personal pager:  [phone] FPTS Service Pager:  [phone]  I  have seen and examined this patient, reviewed their chart. I have discussed this patient with the resident. I agree with the resident's findings, assessment and care plan.  78 yo presenting with 1 day history of left sided chest pain.  PMH significant for hyperlipidemia and history of diverticulitis as well as leiomyosarcoma.  Chest pain awoke her from sleep and originally improved with ASA but then recurred.  Presented to PCP and was sent to ED for concern for chest pain in 78 year old female.  As patient with known hyperlipidemia and no previous work-up for CAD, admitted to [facility] for overnight observation.    PE: Gen:  Alert, cooperative patient who appears stated age in no acute distress.  Vital signs reviewed. HEENT:  [place]/AT, MMM Cardiac:  Somewhat bradycardic with regular rhythm.  Good S1/S2. Pulm:  Clear to auscultation bilaterally with good air movement.  No wheezes or rales noted.   Ext:  No clubbing/cyanosis/erythema.  No edema noted bilateral lower extremities.    Imp/Plan: - Chest pain:  Likely musculoskeletal.  She is only s/p 1 set of troponins and these are point of care.  She needs at least 2 real troponins before we are comfortable discharging her.  EKG with no ST changes or T wave inversions.  Will keep on telemetry overnight and monitor her bradycardia.   - Agree with resident note for other chronic medical issues.    Tobey Grim, MD

## 2012-01-29 NOTE — Discharge Summary (Signed)
Physician Discharge Summary  Patient ID: Monica Ward [id number] 1929/10/08 78 y.o.  Admit date: 01/28/2012 Discharge date: 01/29/2012  PCP: Lucilla Edin, MD   Discharge Diagnosis: 1. Costochondritis, probable 2. Hypercholesterolemia   Discharge Medications  Shandrea, Ahlman  Home Medication Instructions [id number]   Printed on:01/29/12 5784  Medication Information                    Multiple Vitamin (MULITIVITAMIN WITH MINERALS) TABS Take 1 tablet by mouth daily.           omega-3 acid ethyl esters (LOVAZA) 1 G capsule Take 1 g by mouth daily.           pravastatin (PRAVACHOL) 20 MG tablet Take 1 tablet (20 mg total) by mouth daily.           simvastatin (ZOCOR) 20 MG tablet Take 20 mg by mouth every evening.           Multiple Vitamins-Minerals (ZINC PO) Take 1 tablet by mouth daily.           aspirin 81 MG chewable tablet Chew 81 mg by mouth once. Patient took 2 tablets at 0400 this morning and an additional 2 tablets prior to coming to the ED.           Simethicone (GAS-X PO) Take 1 tablet by mouth once as needed. As needed for gas.             Labs: CBC  Lab 01/28/12 1743 01/28/12 1329  WBC 5.2 5.3  HGB 12.2 13.1  HCT 37.0 39.1  PLT 213 223   BMET  Lab 01/28/12 1743 01/28/12 1329  NA -- 141  K -- 4.3  CL -- 105  CO2 -- 28  BUN -- 15  CREATININE 0.63 0.64  CALCIUM -- 9.4  PROT -- 6.9  BILITOT -- 0.3  ALKPHOS -- 57  ALT -- 13  AST -- 19  GLUCOSE -- 83   Results for orders placed during the hospital encounter of 01/28/12 (from the past 72 hour(s))  CBC     Status: Normal   Collection Time   01/28/12  1:29 PM      Component Value Range Comment   WBC 5.3  4.0 - 10.5 K/uL    RBC 4.24  3.87 - 5.11 MIL/uL    Hemoglobin 13.1  12.0 - 15.0 g/dL    HCT 69.6  29.5 - 28.4 %    MCV 92.2  78.0 - 100.0 fL    MCH 30.9  26.0 - 34.0 pg    MCHC 33.5  30.0 - 36.0 g/dL    RDW 13.2  44.0 - 10.2 %    Platelets 223  150 - 400 K/uL   COMPREHENSIVE  METABOLIC PANEL     Status: Abnormal   Collection Time   01/28/12  1:29 PM      Component Value Range Comment   Sodium 141  135 - 145 mEq/L    Potassium 4.3  3.5 - 5.1 mEq/L    Chloride 105  96 - 112 mEq/L    CO2 28  19 - 32 mEq/L    Glucose, Bld 83  70 - 99 mg/dL    BUN 15  6 - 23 mg/dL    Creatinine, Ser 7.25  0.50 - 1.10 mg/dL    Calcium 9.4  8.4 - 36.6 mg/dL    Total Protein 6.9  6.0 - 8.3 g/dL    Albumin 3.8  3.5 - 5.2 g/dL    AST 19  0 - 37 U/L    ALT 13  0 - 35 U/L    Alkaline Phosphatase 57  39 - 117 U/L    Total Bilirubin 0.3  0.3 - 1.2 mg/dL    GFR calc non Af Amer 82 (*) >90 mL/min    GFR calc Af Amer >90  >90 mL/min   PROTIME-INR     Status: Normal   Collection Time   01/28/12  1:29 PM      Component Value Range Comment   Prothrombin Time 13.1  11.6 - 15.2 seconds    INR 1.00  0.00 - 1.49   APTT     Status: Normal   Collection Time   01/28/12  1:29 PM      Component Value Range Comment   aPTT 30  24 - 37 seconds   POCT I-STAT TROPONIN I     Status: Normal   Collection Time   01/28/12  1:57 PM      Component Value Range Comment   Troponin i, poc 0.00  0.00 - 0.08 ng/mL    Comment 3            HEMOGLOBIN A1C     Status: Abnormal   Collection Time   01/28/12  5:43 PM      Component Value Range Comment   Hemoglobin A1C 5.8 (*) <5.7 %    Mean Plasma Glucose 120 (*) <117 mg/dL   CBC     Status: Normal   Collection Time   01/28/12  5:43 PM      Component Value Range Comment   WBC 5.2  4.0 - 10.5 K/uL    RBC 4.00  3.87 - 5.11 MIL/uL    Hemoglobin 12.2  12.0 - 15.0 g/dL    HCT 16.1  09.6 - 04.5 %    MCV 92.5  78.0 - 100.0 fL    MCH 30.5  26.0 - 34.0 pg    MCHC 33.0  30.0 - 36.0 g/dL    RDW 40.9  81.1 - 91.4 %    Platelets 213  150 - 400 K/uL   CREATININE, SERUM     Status: Abnormal   Collection Time   01/28/12  5:43 PM      Component Value Range Comment   Creatinine, Ser 0.63  0.50 - 1.10 mg/dL    GFR calc non Af Amer 82 (*) >90 mL/min    GFR calc Af Amer >90   >90 mL/min   TROPONIN I     Status: Normal   Collection Time   01/28/12  5:45 PM      Component Value Range Comment   Troponin I <0.30  <0.30 ng/mL   LIPID PANEL     Status: Normal   Collection Time   01/29/12  1:21 AM      Component Value Range Comment   Cholesterol 182  0 - 200 mg/dL    Triglycerides 782  <956 mg/dL    HDL 62  >21 mg/dL    Total CHOL/HDL Ratio 2.9      VLDL 22  0 - 40 mg/dL    LDL Cholesterol 98  0 - 99 mg/dL   TROPONIN I     Status: Normal   Collection Time   01/29/12  1:22 AM      Component Value Range Comment   Troponin I <0.30  <0.30 ng/mL    Hemoglobin A1c: 5.8  Procedures/Imaging:  Dg Chest Portable 1 View  01/28/2012  *RADIOLOGY REPORT*  Clinical Data: Chest pain.  PORTABLE CHEST - 1 VIEW  Comparison: 08/17/2011  Findings: There is hyperinflation of the lungs compatible with COPD.  Heart and mediastinal contours are within normal limits.  No focal opacities or effusions.  No acute bony abnormality.  IMPRESSION: COPD.  No active disease.   Original Report Authenticated By: Cyndie Chime, M.D.      Brief Hospital Course:  Chest Pain: Patient was admitted to hospital on tele for chest pain workup. And her ASA was continued. An EKG was performed in the ED which appeared virtually unchanged from her previous EKG. She had a TIMI score of 2 (age and ASA only) and Wells criteria of 0, making PE unlikely. Overnight she remained on tele and did have an episode of bradycardia in the upper 30's while sleeping which went back up to the 50's. The patient remained asleep during the episode. On hospital day 1, her cardiac enzymes were negative x 2. She was risk stratified with a1c (5.8), fasting lipids (Chol 182  Trig 109  HDL 62  LDL 98  VLDL 22), and TSH (2.473).    Patient condition at time of discharge/disposition:   Physical Exam: Gen: Sitting up in bed eating breakfast in NAD  HEENT: Moist mucous membranes. No scleral icterus.  CV: Regular rate and rhythm, no  murmurs rubs or gallops  PULM: Clear to auscultation bilaterally. No wheezes/rales/rhonchi  ABD: Soft, non tender, non distended, normal bowel sounds. Midline scar present.  EXT: No edema  MSK: chest wall tender to palpation at the costosternal at the lower portion of the sternum.  Neuro: Alert and oriented x3. No motor or sensory deficits.   Patient is discharged home in stable medical condition.   Follow up issues: 1. Costochondritis - Patient instructed to take acetaminophen or ibuprofen as needed for pain.  2. Hypercholesterolemia - fasting lipids showed LDL of 98 3. Sinus bradycardia - will schedule outpatient Cardiology appointment  Discharge follow up:  Discharge Orders    Future Appointments: Provider: Department: Dept Phone: Center:   11/02/2012 8:00 AM Collene Gobble, MD [facility] [phone] [facility]      Regino Bellow  MS IV 01/29/2012    PGY III ADDENDUM:  Patient seen and examined on day of discharge with medical student, Regino Bellow. Available data reviewed. Agree with findings, assessment, and plan as outlined above.  My additional findings are documented below.  S: Patient has no complaints today.  Denies any chest pain, SOB, nausea/vomiting, or diaphoresis.  She says the pain she felt LT side of chest has resolved.  She remembers now that she started wearing a new bra with wires that may have caused pain.  O:  Filed Vitals:   01/29/12 0603  BP: 128/50  Pulse: 53  Temp: 97.4 F (36.3 C)  Resp: 18   Gen: Sitting up in bed, in NAD HEENT: Moist mucous membranes. No scleral icterus.  CV: Regular rate and rhythm, no murmurs rubs or gallops  PULM: Clear to auscultation bilaterally. No wheezes/rales/rhonchi  ABD: Soft, non tender, non distended. EXT: No edema  MSK: tenderness on palpation at the costosternal at the lower portion of the sternum.  Skin: normal, no rash, bruises, or lesions; no erythema on chest where pain is located  A/P: as outlined  above.  CP likely secondary to costochondritis, no evidence of cardiac cause.  Patient will be discharged home in  stable medical condition with close follow up with PCP.  Will also schedule outpatient follow up with Cardiology for evaluation of sinus bradycardia.  Ivy de [employer], DO 01/29/2012 9:53 AM

## 2012-01-29 NOTE — Care Management Note (Unsigned)
    Page 1 of 1   01/29/2012     1:31:22 PM   CARE MANAGEMENT NOTE 01/29/2012  Patient:  Monica Ward, Monica Ward   Account Number:  [id number]  Date Initiated:  01/29/2012  Documentation initiated by:  SIMMONS,Nollie Terlizzi  Subjective/Objective Assessment:   ADMITTED WITH CP; LIVES AT HOME WITH HUSBAND; WAS IPTA.     Action/Plan:   DISCHARGE PLANNING INITIATED.   Anticipated DC Date:  01/29/2012   Anticipated DC Plan:  HOME/SELF CARE      DC Planning Services  CM consult      Choice offered to / List presented to:             Status of service:  In process, will continue to follow Medicare Important Message given?   (If response is "NO", the following Medicare IM given date fields will be blank) Date Medicare IM given:   Date Additional Medicare IM given:    Discharge Disposition:    Per UR Regulation:  Reviewed for med. necessity/level of care/duration of stay  If discussed at Long Length of Stay Meetings, dates discussed:    Comments:  01/29/12  1052  Martese Vanatta SIMMONS RN, BSN [phone] NCM WILL FOLLOW.

## 2012-02-10 ENCOUNTER — Encounter: Payer: Self-pay | Admitting: Family Medicine

## 2012-02-12 ENCOUNTER — Ambulatory Visit (INDEPENDENT_AMBULATORY_CARE_PROVIDER_SITE_OTHER): Payer: Medicare Other | Admitting: Cardiology

## 2012-02-12 ENCOUNTER — Encounter: Payer: Self-pay | Admitting: Cardiology

## 2012-02-12 VITALS — BP 123/57 | HR 74 | Ht 68.0 in | Wt 126.6 lb

## 2012-02-12 DIAGNOSIS — R072 Precordial pain: Secondary | ICD-10-CM

## 2012-02-12 HISTORY — DX: Precordial pain: R07.2

## 2012-02-12 NOTE — Patient Instructions (Addendum)
The current medical regimen is effective;  continue present plan and medications.  Follow up as needed 

## 2012-02-12 NOTE — Progress Notes (Signed)
HPI The patient presents for evaluation of bradycardia and chest discomfort. She has had no prior cardiac history. She was hospitalized recently with chest discomfort. She thinks that this happened after carrying heavy things at the [facility].  She ruled out for myocardial infarction. EKG demonstrated no evidence of ischemia or infarct. Her symptoms completely resolved. He pointed to a discomfort midsternal or lower. This was thought to be costochondritis. She has otherwise been quite active in her yard and has never had any chest pressure, neck or arm discomfort. She has never had any shortness of breath, PND or orthopnea.  I did review the discharge summary and hospital notes and she was reported to have some bradycardia but she has had no presyncope or syncope.  No Known Allergies  Current Outpatient Prescriptions  Medication Sig Dispense Refill  . aspirin 81 MG chewable tablet Chew 81 mg by mouth once. Patient took 2 tablets at 0400 this morning and an additional 2 tablets prior to coming to the ED.      . Multiple Vitamin (MULITIVITAMIN WITH MINERALS) TABS Take 1 tablet by mouth daily.      . Multiple Vitamins-Minerals (ZINC PO) Take 1 tablet by mouth daily.      . nitroGLYCERIN (NITROSTAT) 0.4 MG SL tablet Place 1 tablet (0.4 mg total) under the tongue every 5 (five) minutes x 3 doses as needed for chest pain.  15 tablet  0  . omega-3 acid ethyl esters (LOVAZA) 1 G capsule Take 1 g by mouth daily.      . pravastatin (PRAVACHOL) 20 MG tablet Take 1 tablet (20 mg total) by mouth daily.  90 tablet  1  . Simethicone (GAS-X PO) Take 1 tablet by mouth once as needed. As needed for gas.        Past Medical History  Diagnosis Date  . High cholesterol   . SBO (small bowel obstruction) 08/2009    Early, partial SBO improved with conservative Tx   . Diverticulitis     Per chart   . Cancer Around 2002-2003    Unclear history of "7 lb" abdominal tumor treated at [facility] ~2003 s/p open  abdominal colonic resection and radiation  . Complication of anesthesia     "trouble waking up; too much med given" (01/28/2012)  . Arthritis     "probably" (01/28/2012)    Past Surgical History  Procedure Date  . Cesarean section 1968  . Tumor removal 1990's;  X 2    "benign"  . Abdominal hysterectomy ~ 1985  . Appendectomy 1968  . Tonsillectomy     "when I was a child"  . Colectomy 2003    "cancer"    Family History  Problem Relation Age of Onset  . Heart failure Father     History   Social History  . Marital Status: Married    Spouse Name: N/A    Number of Children: N/A  . Years of Education: N/A   Occupational History  . Not on file.   Social History Main Topics  . Smoking status: Never Smoker   . Smokeless tobacco: Never Used  . Alcohol Use: Yes     social  . Drug Use: No  . Sexually Active: Not Currently   Other Topics Concern  . Not on file   Social History Narrative   She lives at home with her husband and has 3 kids. She is still very active, ambulatory, goes bike riding. Spends half the year in [place]  and half the year in [place]. Social smoker previously but not heavy, not current smoker.      ROS:   As stated in the HPI and negative for all other systems.   PHYSICAL EXAM BP 123/57  Pulse 74  Ht 5\' 8"  (1.727 m)  Wt 126 lb 9.6 oz (57.425 kg)  BMI 19.25 kg/m2 GENERAL:  Well appearing HEENT:  Pupils equal round and reactive, fundi not visualized, oral mucosa unremarkable NECK:  No jugular venous distention, waveform within normal limits, carotid upstroke brisk and symmetric, no bruits, no thyromegaly LYMPHATICS:  No cervical, inguinal adenopathy LUNGS:  Clear to auscultation bilaterally BACK:  No CVA tenderness CHEST:  Unremarkable HEART:  PMI not displaced or sustained,S1 and S2 within normal limits, no S3, no S4, no clicks, no rubs, no murmurs ABD:  Flat, positive bowel sounds normal in frequency in pitch, no bruits, no rebound, no guarding, no  midline pulsatile mass, no hepatomegaly, no splenomegaly EXT:  2 plus pulses throughout, no edema, no cyanosis no clubbing SKIN:  No rashes no nodules NEURO:  Cranial nerves II through XII grossly intact, motor grossly intact throughout PSYCH:  Cognitively intact, oriented to person place and time  EKG:  Sinus bradycardia, rate 56, axis within normal limits, intervals within normal limits, no acute ST-T wave changes. 02/12/2012   ASSESSMENT AND PLAN  Chest pain - Her chest pain was atypical. It has resolved. No further workup is suggested.  Bradycardia - She has had no symptoms. Her most significant bradycardia was while she was at sleep. No further workup is suggested.

## 2012-11-02 ENCOUNTER — Encounter: Payer: Medicare Other | Admitting: Emergency Medicine

## 2012-11-16 ENCOUNTER — Encounter: Payer: Self-pay | Admitting: Emergency Medicine

## 2012-11-16 ENCOUNTER — Ambulatory Visit (INDEPENDENT_AMBULATORY_CARE_PROVIDER_SITE_OTHER): Payer: Medicare Other | Admitting: Emergency Medicine

## 2012-11-16 VITALS — BP 111/69 | HR 75 | Temp 97.4°F | Resp 16 | Ht 68.0 in | Wt 120.0 lb

## 2012-11-16 DIAGNOSIS — Z0000 Encounter for general adult medical examination without abnormal findings: Secondary | ICD-10-CM

## 2012-11-16 DIAGNOSIS — K921 Melena: Secondary | ICD-10-CM

## 2012-11-16 DIAGNOSIS — E785 Hyperlipidemia, unspecified: Secondary | ICD-10-CM

## 2012-11-16 DIAGNOSIS — Z129 Encounter for screening for malignant neoplasm, site unspecified: Secondary | ICD-10-CM

## 2012-11-16 DIAGNOSIS — Z79899 Other long term (current) drug therapy: Secondary | ICD-10-CM

## 2012-11-16 DIAGNOSIS — Z139 Encounter for screening, unspecified: Secondary | ICD-10-CM

## 2012-11-16 DIAGNOSIS — Z1231 Encounter for screening mammogram for malignant neoplasm of breast: Secondary | ICD-10-CM

## 2012-11-16 LAB — COMPREHENSIVE METABOLIC PANEL
ALT: 13 U/L (ref 0–35)
AST: 17 U/L (ref 0–37)
Albumin: 4.5 g/dL (ref 3.5–5.2)
Alkaline Phosphatase: 58 U/L (ref 39–117)
BUN: 15 mg/dL (ref 6–23)
CO2: 28 mEq/L (ref 19–32)
Calcium: 9.6 mg/dL (ref 8.4–10.5)
Chloride: 104 mEq/L (ref 96–112)
Creat: 0.71 mg/dL (ref 0.50–1.10)
Glucose, Bld: 87 mg/dL (ref 70–99)
Potassium: 4 mEq/L (ref 3.5–5.3)
Sodium: 140 mEq/L (ref 135–145)
Total Bilirubin: 0.6 mg/dL (ref 0.3–1.2)
Total Protein: 7.1 g/dL (ref 6.0–8.3)

## 2012-11-16 LAB — IFOBT (OCCULT BLOOD): IFOBT: POSITIVE

## 2012-11-16 LAB — CBC WITH DIFFERENTIAL/PLATELET
Basophils Absolute: 0 10*3/uL (ref 0.0–0.1)
Basophils Relative: 0 % (ref 0–1)
Eosinophils Absolute: 0.1 10*3/uL (ref 0.0–0.7)
Eosinophils Relative: 2 % (ref 0–5)
HCT: 42.2 % (ref 36.0–46.0)
Hemoglobin: 14.2 g/dL (ref 12.0–15.0)
Lymphocytes Relative: 27 % (ref 12–46)
Lymphs Abs: 1.2 10*3/uL (ref 0.7–4.0)
MCH: 30.8 pg (ref 26.0–34.0)
MCHC: 33.6 g/dL (ref 30.0–36.0)
MCV: 91.5 fL (ref 78.0–100.0)
Monocytes Absolute: 0.4 10*3/uL (ref 0.1–1.0)
Monocytes Relative: 10 % (ref 3–12)
Neutro Abs: 2.8 10*3/uL (ref 1.7–7.7)
Neutrophils Relative %: 61 % (ref 43–77)
Platelets: 246 10*3/uL (ref 150–400)
RBC: 4.61 MIL/uL (ref 3.87–5.11)
RDW: 13.6 % (ref 11.5–15.5)
WBC: 4.5 10*3/uL (ref 4.0–10.5)

## 2012-11-16 LAB — POCT URINALYSIS DIPSTICK
Bilirubin, UA: NEGATIVE
Glucose, UA: NEGATIVE
Ketones, UA: NEGATIVE
Nitrite, UA: NEGATIVE
Protein, UA: NEGATIVE
Spec Grav, UA: 1.01
Urobilinogen, UA: 0.2
pH, UA: 5.5

## 2012-11-16 LAB — LIPID PANEL
Cholesterol: 207 mg/dL — ABNORMAL HIGH (ref 0–200)
HDL: 64 mg/dL (ref >39)
LDL Cholesterol: 129 mg/dL — ABNORMAL HIGH (ref 0–99)
Total CHOL/HDL Ratio: 3.2 Ratio
Triglycerides: 69 mg/dL (ref <150)
VLDL: 14 mg/dL (ref 0–40)

## 2012-11-16 MED ORDER — ATORVASTATIN CALCIUM 20 MG PO TABS: 20.0000 mg | ORAL_TABLET | Freq: Every day | ORAL | Status: DC

## 2012-11-16 NOTE — Progress Notes (Signed)
@[email or web address] @  Patient ID: Monica Ward MRN: [id number], DOB: [birth year], 79 y.o. Date of Encounter: 11/16/2012, 10:43 AM  Primary Physician: Lucilla Edin, MD  Chief Complaint: Physical (CPE)  HPI: 79 y.o. y/o female with history of noted below here for CPE.  Doing well. No issues/complaints.  LMP:  Pap: MMG: Review of Systems:  Consitutional: No fever, chills, fatigue, night sweats, lymphadenopathy, or weight changes. Eyes: No visual changes, eye redness, or discharge. ENT/Mouth: Ears: No otalgia, tinnitus, hearing loss, discharge. Nose: No congestion, rhinorrhea, sinus pain, or epistaxis. Throat: No sore throat, post nasal drip, or teeth pain. She has a painful area in the helix of her left ear Cardiovascular: No CP, palpitations, diaphoresis, DOE, edema, orthopnea, PND. Respiratory: She has a history of exercise-induced asthma she has Dulera to take but rarely takes it. As a pro-air inhaler to use as  Gastrointestinal: No anorexia, dysphagia, reflux, pain, nausea, vomiting, hematemesis, diarrhea, constipation, BRBPR, or melena. Breast: No discharge, pain, swelling, or mass. Genitourinary: No dysuria, frequency, urgency, hematuria, incontinence, nocturia, amenorrhea, vaginal discharge, pruritis, burning, abnormal bleeding, or pain. Musculoskeletal: No decreased ROM, myalgias, stiffness, joint swelling, or weakness. Skin: No rash, erythema, lesion changes, pain, warmth, jaundice, or pruritis. Neurological: No headache, dizziness, syncope, seizures, tremors, memory loss, coordination problems, or paresthesias. Psychological: No anxiety, depression, hallucinations, SI/HI. Endocrine: No fatigue, polydipsia, polyphagia, polyuria, or known diabetes. All other systems were reviewed and are otherwise negative.  Past Medical History  Diagnosis Date  . High cholesterol   . SBO (small bowel obstruction) 08/2009    Early, partial SBO improved with conservative Tx   . Diverticulitis    Per chart   . Cancer Around 2002-2003    Unclear history of "7 lb" abdominal tumor treated at [facility] ~2003 s/p open abdominal colonic resection and radiation  . Complication of anesthesia     "trouble waking up; too much med given" (01/28/2012)  . Arthritis     "probably" (01/28/2012)     Past Surgical History  Procedure Laterality Date  . Cesarean section  1968  . Tumor removal  1990's;  X 2    "benign"  . Abdominal hysterectomy  ~ 1985  . Appendectomy  1968  . Tonsillectomy      "when I was a child"  . Colectomy  2003    "cancer"    Home Meds:  Prior to Admission medications   Medication Sig Start Date End Date Taking? Authorizing Provider  aspirin 81 MG chewable tablet Chew 81 mg by mouth once. Patient took 2 tablets at 0400 this morning and an additional 2 tablets prior to coming to the ED.   Yes Historical Provider, MD  Multiple Vitamin (MULITIVITAMIN WITH MINERALS) TABS Take 1 tablet by mouth daily.   Yes Historical Provider, MD  omega-3 acid ethyl esters (LOVAZA) 1 G capsule Take 1 g by mouth daily.   Yes Historical Provider, MD  pravastatin (PRAVACHOL) 20 MG tablet Take 1 tablet (20 mg total) by mouth daily. 01/27/12  Yes Heather M Marte, PA-C  Simethicone (GAS-X PO) Take 1 tablet by mouth once as needed. As needed for gas.   Yes Historical Provider, MD  nitroGLYCERIN (NITROSTAT) 0.4 MG SL tablet Place 1 tablet (0.4 mg total) under the tongue every 5 (five) minutes x 3 doses as needed for chest pain. 01/29/12   Ivy de Lawson Radar, DO    Allergies: No Known Allergies  History   Social History  . Marital Status: Married  Spouse Name: N/A    Number of Children: N/A  . Years of Education: N/A   Occupational History  . Not on file.   Social History Main Topics  . Smoking status: Never Smoker   . Smokeless tobacco: Never Used  . Alcohol Use: Yes     Comment: social  . Drug Use: No  . Sexually Active: Not Currently   Other Topics Concern  . Not on file   Social  History Narrative   She lives at home with her husband and has 3 kids. She is still very active, ambulatory, goes bike riding. Spends half the year in [place] and half the year in [place]. Social smoker previously but not heavy, not current smoker.      Family History  Problem Relation Age of Onset  . Heart failure Father 52    Heart failure  . Heart disease Father     Physical Exam  Blood pressure 111/69, pulse 75, temperature 97.4 F (36.3 C), resp. rate 16, height 5\' 8"  (1.727 m), weight 120 lb (54.432 kg)., Body mass index is 18.25 kg/(m^2). General: Well developed, well nourished, in no acute distress. HEENT: Normocephalic, atraumatic. Conjunctiva pink, sclera non-icteric. Pupils 2 mm constricting to 1 mm, round, regular, and equally reactive to light and accomodation. EOMI. Internal auditory canal clear. TMs with good cone of light and without pathology. Nasal mucosa pink. Nares are without discharge. No sinus tenderness. Oral mucosa pink. Dentition normal there is a rib area on the inside of the left ear which is about 1 x 1 cm  . Pharynx without exudate.   Neck: Supple. Trachea midline. No thyromegaly. Full ROM. No lymphadenopathy. Lungs: Clear to auscultation bilaterally without wheezes, rales, or rhonchi. Breathing is of normal effort and unlabored. Cardiovascular: RRR with S1 S2. No murmurs, rubs, or gallops appreciated. Distal pulses 2+ symmetrically. No carotid or abdominal bruits. Breast: There are no masses palpable. There is a midline scar which extends from the subxiphoid area to the suprapubic area. There is also a Pfannenstiel incisional scar  Abdomen: Soft, non-tender, non-distended with normoactive bowel sounds. No hepatosplenomegaly or masses. No rebound/guarding. No CVA tenderness. Without hernias. There is a midline scar which extends from the subxiphoid area to the superior  Genitourinary deferred:   Musculoskeletal: Full range of motion and 5/5 strength throughout.  Without swelling, atrophy, tenderness, crepitus, or warmth. Extremities without clubbing, cyanosis, or edema. Calves supple. Skin: Warm and moist without erythema, ecchymosis, wounds, or rash. Neuro: A+Ox3. CN II-XII grossly intact. Moves all extremities spontaneously. Full sensation throughout. Normal gait. DTR 2+ throughout upper and lower extremities. Finger to nose intact. Psych:  Responds to questions appropriately with a normal affect.  EKG THERE are tiny inferior Q waves Results for orders placed in visit on 11/16/12  POCT URINALYSIS DIPSTICK      Result Value Range   Color, UA yellow     Clarity, UA cloudy     Glucose, UA neg     Bilirubin, UA neg     Ketones, UA neg     Spec Grav, UA 1.010     Blood, UA trace     pH, UA 5.5     Protein, UA neg     Urobilinogen, UA 0.2     Nitrite, UA neg     Leukocytes, UA small (1+)    IFOBT (OCCULT BLOOD)      Result Value Range   IFOBT Positive  Assessment/Plan:  79 y.o. y/o female here for CPE. Patient has had recurrent problems with GI his stress small bowel obstruction related to adhesions related to 4 previous surgeries. She does have blood in her stool referral made  . Referral also made for screening her mammogram. She declined shingles vaccine.  -  Signed, Earl Lites, MD 11/16/2012 10:43 AM

## 2012-12-10 ENCOUNTER — Ambulatory Visit: Payer: Medicare Other

## 2013-04-23 ENCOUNTER — Emergency Department (HOSPITAL_COMMUNITY)
Admission: EM | Admit: 2013-04-23 | Discharge: 2013-04-23 | Disposition: A | Discharge status: Home / Self Care | Payer: Medicare Other | Attending: Emergency Medicine | Admitting: Emergency Medicine

## 2013-04-23 ENCOUNTER — Encounter (HOSPITAL_COMMUNITY): Payer: Self-pay | Admitting: Emergency Medicine

## 2013-04-23 DIAGNOSIS — R110 Nausea: Secondary | ICD-10-CM | POA: Insufficient documentation

## 2013-04-23 DIAGNOSIS — E7800 Pure hypercholesterolemia, unspecified: Secondary | ICD-10-CM | POA: Insufficient documentation

## 2013-04-23 DIAGNOSIS — Z7982 Long term (current) use of aspirin: Secondary | ICD-10-CM | POA: Insufficient documentation

## 2013-04-23 DIAGNOSIS — M129 Arthropathy, unspecified: Secondary | ICD-10-CM | POA: Insufficient documentation

## 2013-04-23 DIAGNOSIS — Z79899 Other long term (current) drug therapy: Secondary | ICD-10-CM | POA: Insufficient documentation

## 2013-04-23 DIAGNOSIS — R197 Diarrhea, unspecified: Secondary | ICD-10-CM

## 2013-04-23 DIAGNOSIS — R5381 Other malaise: Secondary | ICD-10-CM | POA: Insufficient documentation

## 2013-04-23 DIAGNOSIS — R51 Headache: Secondary | ICD-10-CM | POA: Insufficient documentation

## 2013-04-23 LAB — COMPREHENSIVE METABOLIC PANEL
ALT: 17 U/L (ref 0–35)
AST: 26 U/L (ref 0–37)
Albumin: 3.5 g/dL (ref 3.5–5.2)
Alkaline Phosphatase: 62 U/L (ref 39–117)
BUN: 8 mg/dL (ref 6–23)
CO2: 27 mEq/L (ref 19–32)
Calcium: 8.6 mg/dL (ref 8.4–10.5)
Chloride: 95 mEq/L — ABNORMAL LOW (ref 96–112)
Creatinine, Ser: 0.53 mg/dL (ref 0.50–1.10)
GFR calc Af Amer: >90 mL/min (ref >90)
GFR calc non Af Amer: 87 mL/min — ABNORMAL LOW (ref >90)
Glucose, Bld: 124 mg/dL — ABNORMAL HIGH (ref 70–99)
Potassium: 3.6 mEq/L (ref 3.5–5.1)
Sodium: 130 mEq/L — ABNORMAL LOW (ref 135–145)
Total Bilirubin: 0.2 mg/dL — ABNORMAL LOW (ref 0.3–1.2)
Total Protein: 6.8 g/dL (ref 6.0–8.3)

## 2013-04-23 LAB — CG4 I-STAT (LACTIC ACID): Lactic Acid, Venous: 0.9 mmol/L (ref 0.5–2.2)

## 2013-04-23 LAB — CBC WITH DIFFERENTIAL/PLATELET
Basophils Absolute: 0 10*3/uL (ref 0.0–0.1)
Basophils Relative: 0 % (ref 0–1)
Eosinophils Absolute: 0 10*3/uL (ref 0.0–0.7)
Eosinophils Relative: 0 % (ref 0–5)
HCT: 39.8 % (ref 36.0–46.0)
Hemoglobin: 13.3 g/dL (ref 12.0–15.0)
Lymphocytes Relative: 14 % (ref 12–46)
Lymphs Abs: 0.5 10*3/uL — ABNORMAL LOW (ref 0.7–4.0)
MCH: 30.9 pg (ref 26.0–34.0)
MCHC: 33.4 g/dL (ref 30.0–36.0)
MCV: 92.6 fL (ref 78.0–100.0)
Monocytes Absolute: 0.4 10*3/uL (ref 0.1–1.0)
Monocytes Relative: 11 % (ref 3–12)
Neutro Abs: 2.9 10*3/uL (ref 1.7–7.7)
Neutrophils Relative %: 75 % (ref 43–77)
Platelets: 185 10*3/uL (ref 150–400)
RBC: 4.3 MIL/uL (ref 3.87–5.11)
RDW: 13.5 % (ref 11.5–15.5)
WBC: 3.9 10*3/uL — ABNORMAL LOW (ref 4.0–10.5)

## 2013-04-23 LAB — URINALYSIS, ROUTINE W REFLEX MICROSCOPIC
Bilirubin Urine: NEGATIVE
Glucose, UA: NEGATIVE mg/dL
Hgb urine dipstick: NEGATIVE
Ketones, ur: NEGATIVE mg/dL
Leukocytes, UA: NEGATIVE
Nitrite: NEGATIVE
Protein, ur: NEGATIVE mg/dL
Specific Gravity, Urine: 1.007 (ref 1.005–1.030)
Urobilinogen, UA: 1 mg/dL (ref 0.0–1.0)
pH: 6.5 (ref 5.0–8.0)

## 2013-04-23 LAB — LIPASE, BLOOD: Lipase: 29 U/L (ref 11–59)

## 2013-04-23 MED ORDER — ONDANSETRON HCL 4 MG PO TABS: 4.0000 mg | ORAL_TABLET | Freq: Four times a day (QID) | ORAL | Status: DC

## 2013-04-23 MED ORDER — ACETAMINOPHEN 325 MG PO TABS
650.0000 mg | ORAL_TABLET | Freq: Once | ORAL | Status: AC
  Administered 2013-04-23: 650 mg via ORAL
  Filled 2013-04-23: qty 2

## 2013-04-23 MED ORDER — FENTANYL CITRATE 0.05 MG/ML IJ SOLN: 50.0000 ug | INTRAMUSCULAR | Status: DC | PRN

## 2013-04-23 MED ORDER — SODIUM CHLORIDE 0.9 % IV SOLN
INTRAVENOUS | Status: DC
  Administered 2013-04-23: 07:00:00 via INTRAVENOUS

## 2013-04-23 MED ORDER — ONDANSETRON HCL 4 MG/2ML IJ SOLN: 4.0000 mg | Freq: Once | INTRAMUSCULAR | Status: DC

## 2013-04-23 MED ORDER — LOPERAMIDE HCL 2 MG PO CAPS: 2.0000 mg | ORAL_CAPSULE | Freq: Four times a day (QID) | ORAL | Status: DC | PRN

## 2013-04-23 NOTE — ED Notes (Signed)
Pt transported from home via EMS from home with complaints of diarrhea x 3 days. A & O. IV est by EMS, NS 350cc bolus given Zofran 4mg  IVP given

## 2013-04-23 NOTE — ED Notes (Signed)
Pt given phone to call husband to pick her up

## 2013-04-23 NOTE — ED Notes (Signed)
Bed: RESA Expected date: 04/23/13 Expected time: 4:16 AM Means of arrival: Ambulance Comments: n,v

## 2013-04-23 NOTE — ED Notes (Signed)
Zofran not given at this time, pt denies nausea, pt notified if nausea returns notify staff.

## 2013-04-23 NOTE — ED Provider Notes (Signed)
CSN: [id number]     Arrival date & time 04/23/13  4098 History   First MD Initiated Contact with Patient 04/23/13 0502     Chief Complaint  Patient presents with  . Diarrhea   (Consider location/radiation/quality/duration/timing/severity/associated sxs/prior Treatment) HPI History provided by patient. Diarrhea x3 days with some associated nausea but no vomiting. Some mild abdominal cramping on and off but none at this time. Patient presents today with associated headache and generalized weakness. She denies any recent antibiotics. No recent travel. No known sick contacts. Lives at home with her husband. No history of C. difficile. No blood in stools. Symptoms mild to moderate severity. She has not tried anything for this at home. Past Medical History  Diagnosis Date  . High cholesterol   . SBO (small bowel obstruction) 08/2009    Early, partial SBO improved with conservative Tx   . Diverticulitis     Per chart   . Cancer Around 2002-2003    Unclear history of "7 lb" abdominal tumor treated at [facility] ~2003 s/p open abdominal colonic resection and radiation  . Complication of anesthesia     "trouble waking up; too much med given" (01/28/2012)  . Arthritis     "probably" (01/28/2012)   Past Surgical History  Procedure Laterality Date  . Cesarean section  1968  . Tumor removal  1990's;  X 2    "benign"  . Abdominal hysterectomy  ~ 1985  . Appendectomy  1968  . Tonsillectomy      "when I was a child"  . Colectomy  2003    "cancer"   Family History  Problem Relation Age of Onset  . Heart failure Father 52    Heart failure  . Heart disease Father    History  Substance Use Topics  . Smoking status: Never Smoker   . Smokeless tobacco: Never Used  . Alcohol Use: Yes     Comment: social   OB History   Grav Para Term Preterm Abortions TAB SAB Ect Mult Living                 Review of Systems  Constitutional: Negative for fever and chills.  Eyes: Negative for visual  disturbance.  Respiratory: Negative for shortness of breath.   Cardiovascular: Negative for chest pain.  Gastrointestinal: Positive for diarrhea. Negative for vomiting and anal bleeding.  Genitourinary: Negative for dysuria.  Musculoskeletal: Negative for back pain, neck pain and neck stiffness.  Skin: Negative for rash.  Neurological: Negative for headaches.  All other systems reviewed and are negative.    Allergies  Review of patient's allergies indicates no known allergies.  Home Medications   Current Outpatient Rx  Name  Route  Sig  Dispense  Refill  . aspirin 81 MG chewable tablet   Oral   Chew 81 mg by mouth once.          Marland Kitchen atorvastatin (LIPITOR) 20 MG tablet   Oral   Take 1 tablet (20 mg total) by mouth daily.   90 tablet   3   . Multiple Vitamin (MULITIVITAMIN WITH MINERALS) TABS   Oral   Take 1 tablet by mouth daily.         . Multiple Vitamins-Minerals (ZINC PO)   Oral   Take 1 tablet by mouth daily.         . Omega-3 Fatty Acids (OMEGA 3 PO)   Oral   Take 1 capsule by mouth daily.         Marland Kitchen  Simethicone (GAS-X PO)   Oral   Take 1 tablet by mouth once as needed. As needed for gas.         Marland Kitchen VITAMIN E PO   Oral   Take 1 tablet by mouth daily.          BP 99/52  Pulse 52  Temp(Src) 97.5 F (36.4 C) (Oral)  Resp 16  SpO2 98% Physical Exam  Constitutional: She is oriented to person, place, and time. She appears well-developed and well-nourished.  HENT:  Head: Normocephalic and atraumatic.  Dry mucous membranes  Eyes: EOM are normal. Pupils are equal, round, and reactive to light. No scleral icterus.  Neck: Neck supple.  Cardiovascular: Normal rate, regular rhythm and intact distal pulses.   Pulmonary/Chest: Effort normal and breath sounds normal. No respiratory distress.  Abdominal: Soft. Bowel sounds are normal. She exhibits no distension and no mass. There is no tenderness. There is no rebound and no guarding.  Musculoskeletal:  Normal range of motion. She exhibits no edema.  Neurological: She is alert and oriented to person, place, and time.  Skin: Skin is warm and dry.    ED Course  Procedures (including critical care time) Labs Review Labs Reviewed  COMPREHENSIVE METABOLIC PANEL - Abnormal; Notable for the following:    Sodium 130 (*)    Chloride 95 (*)    Glucose, Bld 124 (*)    Total Bilirubin 0.2 (*)    GFR calc non Af Amer 87 (*)    All other components within normal limits  CBC WITH DIFFERENTIAL - Abnormal; Notable for the following:    WBC 3.9 (*)    Lymphs Abs 0.5 (*)    All other components within normal limits  LIPASE, BLOOD  URINALYSIS, ROUTINE W REFLEX MICROSCOPIC  CG4 I-STAT (LACTIC ACID)   IV fluids. IV Zofran. IV fentanyl.  7:32 AM on recheck has a slight headache and requesting Tylenol. Otherwise patient requesting to be discharged home. Repeat abdominal exam remains benign . She agrees to strict return precautions. No diarrhea in emergency department. MDM  Diagnosis: Diarrhea  Evaluated with labs reviewed as above Treated with IV fluids and medications Condition improved Vital signs nursing notes reviewed and considered  Sunnie Nielsen, MD 04/23/13 [id number]

## 2013-04-26 ENCOUNTER — Ambulatory Visit: Payer: Medicare Other | Admitting: Emergency Medicine

## 2013-11-22 ENCOUNTER — Ambulatory Visit (INDEPENDENT_AMBULATORY_CARE_PROVIDER_SITE_OTHER): Payer: Medicare Other | Admitting: Emergency Medicine

## 2013-11-22 ENCOUNTER — Encounter: Payer: Self-pay | Admitting: Emergency Medicine

## 2013-11-22 VITALS — BP 120/74 | HR 74 | Resp 16 | Wt 120.0 lb

## 2013-11-22 DIAGNOSIS — Z0000 Encounter for general adult medical examination without abnormal findings: Secondary | ICD-10-CM

## 2013-11-22 DIAGNOSIS — E785 Hyperlipidemia, unspecified: Secondary | ICD-10-CM

## 2013-11-22 DIAGNOSIS — Z23 Encounter for immunization: Secondary | ICD-10-CM

## 2013-11-22 LAB — COMPLETE METABOLIC PANEL WITH GFR
ALT: 15 U/L (ref 0–35)
AST: 19 U/L (ref 0–37)
Albumin: 4.1 g/dL (ref 3.5–5.2)
Alkaline Phosphatase: 51 U/L (ref 39–117)
BUN: 16 mg/dL (ref 6–23)
CO2: 28 mEq/L (ref 19–32)
Calcium: 9.4 mg/dL (ref 8.4–10.5)
Chloride: 106 mEq/L (ref 96–112)
Creat: 0.76 mg/dL (ref 0.50–1.10)
GFR, Est African American: 84 mL/min
GFR, Est Non African American: 73 mL/min
Glucose, Bld: 86 mg/dL (ref 70–99)
Potassium: 4.5 mEq/L (ref 3.5–5.3)
Sodium: 143 mEq/L (ref 135–145)
Total Bilirubin: 0.7 mg/dL (ref 0.2–1.2)
Total Protein: 6.5 g/dL (ref 6.0–8.3)

## 2013-11-22 LAB — LIPID PANEL
Cholesterol: 155 mg/dL (ref 0–200)
HDL: 63 mg/dL (ref >39)
LDL Cholesterol: 80 mg/dL (ref 0–99)
Total CHOL/HDL Ratio: 2.5 Ratio
Triglycerides: 61 mg/dL (ref <150)
VLDL: 12 mg/dL (ref 0–40)

## 2013-11-22 LAB — POCT URINALYSIS DIPSTICK
Bilirubin, UA: NEGATIVE
Blood, UA: NEGATIVE
Glucose, UA: NEGATIVE
Ketones, UA: NEGATIVE
Nitrite, UA: NEGATIVE
Spec Grav, UA: 1.015
Urobilinogen, UA: 0.2
pH, UA: 6

## 2013-11-22 LAB — CBC WITH DIFFERENTIAL/PLATELET
Basophils Absolute: 0 10*3/uL (ref 0.0–0.1)
Basophils Relative: 1 % (ref 0–1)
Eosinophils Absolute: 0.1 10*3/uL (ref 0.0–0.7)
Eosinophils Relative: 2 % (ref 0–5)
HCT: 39.2 % (ref 36.0–46.0)
Hemoglobin: 13.3 g/dL (ref 12.0–15.0)
Lymphocytes Relative: 23 % (ref 12–46)
Lymphs Abs: 1.1 10*3/uL (ref 0.7–4.0)
MCH: 30.5 pg (ref 26.0–34.0)
MCHC: 33.9 g/dL (ref 30.0–36.0)
MCV: 89.9 fL (ref 78.0–100.0)
Monocytes Absolute: 0.6 10*3/uL (ref 0.1–1.0)
Monocytes Relative: 13 % — ABNORMAL HIGH (ref 3–12)
Neutro Abs: 3 10*3/uL (ref 1.7–7.7)
Neutrophils Relative %: 61 % (ref 43–77)
Platelets: 225 10*3/uL (ref 150–400)
RBC: 4.36 MIL/uL (ref 3.87–5.11)
RDW: 13.7 % (ref 11.5–15.5)
WBC: 4.9 10*3/uL (ref 4.0–10.5)

## 2013-11-22 LAB — IFOBT (OCCULT BLOOD): IFOBT: NEGATIVE

## 2013-11-22 MED ORDER — ATORVASTATIN CALCIUM 20 MG PO TABS: 20.0000 mg | ORAL_TABLET | Freq: Every day | ORAL | Status: DC

## 2013-11-22 NOTE — Progress Notes (Addendum)
Subjective:  This chart was scribed for Monica Queen, MD by [name], Medial Scribe. This patient was seen in room 21 and the patient's care was started at 8:15 AM.    Patient ID: Monica Ward, female    DOB: June 17, 1929, 80 y.o.   MRN: [id number]  HPI HPI Comments: Monica Ward is a 80 y.o. female who presents to the [facility] requesting complete physical exam.   Cholesterol. Taking cholesterol medication, Lipitor as directed.   Cancer . Patient has history of an abdominal tumor 15 years ago, pathological diagnosis: spindle cell tumor. She states is followed by [facility], but she denies regular visits to the office. Reports only receiving a letter each month. Her last abdominal scan was 10 years ago.  Patient reports having a colonoscopy approximately 10 years ago. She reports regular mammograms which she schedules on her own. Patient denies history of skin cancer.   Immunizations. Patient refuses Shingles vaccination. She agrees to Pneumovax vaccination today and after discussion she agrees to Tetanus vaccination.   Ophthalmology. Patient is blind in her left eye. States she plans to have her annual eye exams in [place] with a specialist.    Patient Active Problem List   Diagnosis Date Noted  . Precordial pain 02/12/2012  . Costochondral pain 01/29/2012  . 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 Enteritis 08/17/2011  . Leukocytosis 08/17/2011   Past Medical History  Diagnosis Date  . High cholesterol   . SBO (small bowel obstruction) 08/2009    Early, partial SBO improved with conservative Tx   . Diverticulitis     Per chart   . Cancer Around 2002-2003    Unclear history of "7 lb" abdominal tumor treated at [facility] ~2003 s/p open abdominal colonic resection and radiation  . Complication of anesthesia     "trouble waking up; too much med given" (01/28/2012)  . Arthritis     "probably" (01/28/2012)   Past Surgical History  Procedure Laterality Date  .  Cesarean section  1968  . Tumor removal  1990's;  X 2    "benign"  . Abdominal hysterectomy  ~ 1985  . Appendectomy  1968  . Tonsillectomy      "when I was a child"  . Colectomy  2003    "cancer"   No Known Allergies Prior to Admission medications   Medication Sig Start Date End Date Taking? Authorizing Provider  aspirin 81 MG chewable tablet Chew 81 mg by mouth once.     Historical Provider, MD  atorvastatin (LIPITOR) 20 MG tablet Take 1 tablet (20 mg total) by mouth daily. 11/16/12   Darlyne Russian, MD  loperamide (IMODIUM) 2 MG capsule Take 1 capsule (2 mg total) by mouth 4 (four) times daily as needed for diarrhea or loose stools. 04/23/13   Teressa Lower, MD  Multiple Vitamin (MULITIVITAMIN WITH MINERALS) TABS Take 1 tablet by mouth daily.    Historical Provider, MD  Multiple Vitamins-Minerals (ZINC PO) Take 1 tablet by mouth daily.    Historical Provider, MD  Omega-3 Fatty Acids (OMEGA 3 PO) Take 1 capsule by mouth daily.    Historical Provider, MD  ondansetron (ZOFRAN) 4 MG tablet Take 1 tablet (4 mg total) by mouth every 6 (six) hours. 04/23/13   Teressa Lower, MD  Simethicone (GAS-X PO) Take 1 tablet by mouth once as needed. As needed for gas.    Historical Provider, MD  VITAMIN E PO Take 1 tablet by mouth daily.  Historical Provider, MD   History   Social History  . Marital Status: Married    Spouse Name: N/A    Number of Children: N/A  . Years of Education: N/A   Occupational History  . Not on file.   Social History Main Topics  . Smoking status: Never Smoker   . Smokeless tobacco: Never Used  . Alcohol Use: Yes     Comment: social  . Drug Use: No  . Sexual Activity: Not Currently   Other Topics Concern  . Not on file   Social History Narrative   She lives at home with her husband and has 3 kids. She is still very active, ambulatory, goes bike riding. Spends half the year in [place] and half the year in [place]. Social smoker previously but not heavy, not current  smoker.         Review of Systems     Objective:   Physical Exam  CONSTITUTIONAL: Well developed/well nourished HEAD: Normocephalic/atraumatic EYES: EOMI/PERRL ENMT: Mucous membranes moist NECK: supple no meningeal signs SPINE:entire spine nontender CV: S1/S2 noted, no murmurs/rubs/gallops noted LUNGS: Lungs are clear to auscultation bilaterally, no apparent distress ABDOMEN: soft, nontender, no rebound or guarding; large midline abdominal scar  GU:no cva tenderness NEURO: Pt is awake/alert, moves all extremitiesx4 EXTREMITIES: pulses normal, full ROM SKIN: warm, color normal PSYCH: no abnormalities of mood noted  Filed Vitals:   11/22/13 0802  BP: 120/74  Pulse: 74  Resp: 16  Weight: 120 lb (54.432 kg)  SpO2: 98%   Ekg poor R-wave progression     Assessment & Plan:  Patient looks good. She is not interested in the shingles vaccine. She will schedule her own mammogram. I suggested she have a bone density study at the same time. She was given Prevnar  today. Medications were refilled for one year. She is only on a cholesterol medication. Her surgery for tumor resection was for a spindle cell tumor.I personally performed the services described in this documentation, which was scribed in my presence. The recorded information has been reviewed and is accurate. She decided she would not take the tetanus shot today. She asked about new medications for her husband's dementia. I advised her she should bring him in with discussed that with him personally.

## 2014-02-03 ENCOUNTER — Encounter (HOSPITAL_COMMUNITY): Payer: Self-pay | Admitting: Emergency Medicine

## 2014-02-03 ENCOUNTER — Emergency Department (HOSPITAL_COMMUNITY): Payer: Medicare Other

## 2014-02-03 ENCOUNTER — Inpatient Hospital Stay (HOSPITAL_COMMUNITY)
Admission: EM | Admit: 2014-02-03 | Discharge: 2014-02-06 | DRG: 390 | Disposition: A | Discharge status: Home / Self Care | Payer: Medicare Other | Attending: Internal Medicine | Admitting: Internal Medicine

## 2014-02-03 DIAGNOSIS — Z923 Personal history of irradiation: Secondary | ICD-10-CM

## 2014-02-03 DIAGNOSIS — E785 Hyperlipidemia, unspecified: Secondary | ICD-10-CM | POA: Diagnosis present

## 2014-02-03 DIAGNOSIS — K56600 Partial intestinal obstruction, unspecified as to cause: Secondary | ICD-10-CM

## 2014-02-03 DIAGNOSIS — Z85038 Personal history of other malignant neoplasm of large intestine: Secondary | ICD-10-CM | POA: Diagnosis not present

## 2014-02-03 DIAGNOSIS — E780 Pure hypercholesterolemia: Secondary | ICD-10-CM | POA: Diagnosis present

## 2014-02-03 DIAGNOSIS — H5442 Blindness, left eye, normal vision right eye: Secondary | ICD-10-CM | POA: Diagnosis present

## 2014-02-03 DIAGNOSIS — M1990 Unspecified osteoarthritis, unspecified site: Secondary | ICD-10-CM | POA: Diagnosis present

## 2014-02-03 DIAGNOSIS — Z9049 Acquired absence of other specified parts of digestive tract: Secondary | ICD-10-CM | POA: Diagnosis present

## 2014-02-03 DIAGNOSIS — Z7982 Long term (current) use of aspirin: Secondary | ICD-10-CM

## 2014-02-03 DIAGNOSIS — Z90710 Acquired absence of both cervix and uterus: Secondary | ICD-10-CM

## 2014-02-03 DIAGNOSIS — K5900 Constipation, unspecified: Secondary | ICD-10-CM

## 2014-02-03 DIAGNOSIS — K5909 Other constipation: Secondary | ICD-10-CM | POA: Diagnosis present

## 2014-02-03 DIAGNOSIS — K56609 Unspecified intestinal obstruction, unspecified as to partial versus complete obstruction: Secondary | ICD-10-CM

## 2014-02-03 DIAGNOSIS — K5730 Diverticulosis of large intestine without perforation or abscess without bleeding: Secondary | ICD-10-CM | POA: Diagnosis present

## 2014-02-03 DIAGNOSIS — K5790 Diverticulosis of intestine, part unspecified, without perforation or abscess without bleeding: Secondary | ICD-10-CM

## 2014-02-03 DIAGNOSIS — E538 Deficiency of other specified B group vitamins: Secondary | ICD-10-CM | POA: Diagnosis present

## 2014-02-03 HISTORY — DX: Constipation, unspecified: K59.00

## 2014-02-03 HISTORY — DX: Malignant neoplasm of connective and soft tissue, unspecified: C49.9

## 2014-02-03 HISTORY — DX: Diverticulosis of intestine, part unspecified, without perforation or abscess without bleeding: K57.90

## 2014-02-03 HISTORY — DX: Precordial pain: R07.2

## 2014-02-03 HISTORY — DX: Other constipation: K59.09

## 2014-02-03 LAB — COMPREHENSIVE METABOLIC PANEL
ALT: 11 U/L (ref 0–35)
AST: 20 U/L (ref 0–37)
Albumin: 3.7 g/dL (ref 3.5–5.2)
Alkaline Phosphatase: 58 U/L (ref 39–117)
Anion gap: 14 (ref 5–15)
BUN: 15 mg/dL (ref 6–23)
CO2: 24 mEq/L (ref 19–32)
Calcium: 9.3 mg/dL (ref 8.4–10.5)
Chloride: 102 mEq/L (ref 96–112)
Creatinine, Ser: 0.56 mg/dL (ref 0.50–1.10)
GFR calc Af Amer: >90 mL/min (ref >90)
GFR calc non Af Amer: 85 mL/min — ABNORMAL LOW (ref >90)
Glucose, Bld: 99 mg/dL (ref 70–99)
Potassium: 4 mEq/L (ref 3.7–5.3)
Sodium: 140 mEq/L (ref 137–147)
Total Bilirubin: 0.6 mg/dL (ref 0.3–1.2)
Total Protein: 7 g/dL (ref 6.0–8.3)

## 2014-02-03 LAB — URINALYSIS, ROUTINE W REFLEX MICROSCOPIC
Bilirubin Urine: NEGATIVE
Glucose, UA: NEGATIVE mg/dL
Hgb urine dipstick: NEGATIVE
Ketones, ur: >80 mg/dL — AB
Nitrite: NEGATIVE
Protein, ur: NEGATIVE mg/dL
Specific Gravity, Urine: 1.017 (ref 1.005–1.030)
Urobilinogen, UA: 1 mg/dL (ref 0.0–1.0)
pH: 6 (ref 5.0–8.0)

## 2014-02-03 LAB — URINE MICROSCOPIC-ADD ON

## 2014-02-03 LAB — CBC WITH DIFFERENTIAL/PLATELET
Basophils Absolute: 0 10*3/uL (ref 0.0–0.1)
Basophils Relative: 0 % (ref 0–1)
Eosinophils Absolute: 0 10*3/uL (ref 0.0–0.7)
Eosinophils Relative: 0 % (ref 0–5)
HCT: 40.4 % (ref 36.0–46.0)
Hemoglobin: 13.8 g/dL (ref 12.0–15.0)
Lymphocytes Relative: 7 % — ABNORMAL LOW (ref 12–46)
Lymphs Abs: 0.7 10*3/uL (ref 0.7–4.0)
MCH: 31.2 pg (ref 26.0–34.0)
MCHC: 34.2 g/dL (ref 30.0–36.0)
MCV: 91.4 fL (ref 78.0–100.0)
Monocytes Absolute: 0.7 10*3/uL (ref 0.1–1.0)
Monocytes Relative: 7 % (ref 3–12)
Neutro Abs: 8.7 10*3/uL — ABNORMAL HIGH (ref 1.7–7.7)
Neutrophils Relative %: 86 % — ABNORMAL HIGH (ref 43–77)
Platelets: 237 10*3/uL (ref 150–400)
RBC: 4.42 MIL/uL (ref 3.87–5.11)
RDW: 13.8 % (ref 11.5–15.5)
WBC: 10 10*3/uL (ref 4.0–10.5)

## 2014-02-03 LAB — LIPASE, BLOOD: Lipase: 23 U/L (ref 11–59)

## 2014-02-03 MED ORDER — ACETAMINOPHEN 325 MG PO TABS
650.0000 mg | ORAL_TABLET | Freq: Four times a day (QID) | ORAL | Status: DC | PRN
  Administered 2014-02-04: 325 mg via ORAL

## 2014-02-03 MED ORDER — ONDANSETRON HCL 4 MG/2ML IJ SOLN
4.0000 mg | Freq: Once | INTRAMUSCULAR | Status: AC
  Administered 2014-02-03: 4 mg via INTRAVENOUS
  Filled 2014-02-03: qty 2

## 2014-02-03 MED ORDER — SODIUM CHLORIDE 0.9 % IJ SOLN
3.0000 mL | Freq: Two times a day (BID) | INTRAMUSCULAR | Status: DC
  Administered 2014-02-05 – 2014-02-06 (×3): 3 mL via INTRAVENOUS

## 2014-02-03 MED ORDER — IOHEXOL 300 MG/ML  SOLN
50.0000 mL | Freq: Once | INTRAMUSCULAR | Status: AC | PRN
  Administered 2014-02-03: 50 mL via ORAL

## 2014-02-03 MED ORDER — MORPHINE SULFATE 4 MG/ML IJ SOLN: 4.0000 mg | INTRAMUSCULAR | Status: DC | PRN

## 2014-02-03 MED ORDER — MORPHINE SULFATE 2 MG/ML IJ SOLN: 2.0000 mg | INTRAMUSCULAR | Status: DC | PRN

## 2014-02-03 MED ORDER — IOHEXOL 300 MG/ML  SOLN
100.0000 mL | Freq: Once | INTRAMUSCULAR | Status: AC | PRN
  Administered 2014-02-03: 100 mL via INTRAVENOUS

## 2014-02-03 MED ORDER — SODIUM CHLORIDE 0.9 % IV SOLN
INTRAVENOUS | Status: DC
  Administered 2014-02-03 – 2014-02-04 (×3): via INTRAVENOUS

## 2014-02-03 MED ORDER — ONDANSETRON HCL 4 MG/2ML IJ SOLN: 4.0000 mg | Freq: Three times a day (TID) | INTRAMUSCULAR | Status: DC | PRN

## 2014-02-03 MED ORDER — ENOXAPARIN SODIUM 40 MG/0.4ML ~~LOC~~ SOLN
40.0000 mg | SUBCUTANEOUS | Status: DC
  Administered 2014-02-04 – 2014-02-05 (×3): 40 mg via SUBCUTANEOUS
  Filled 2014-02-03 (×5): qty 0.4

## 2014-02-03 MED ORDER — SODIUM CHLORIDE 0.9 % IV SOLN: INTRAVENOUS | Status: DC

## 2014-02-03 MED ORDER — ACETAMINOPHEN 650 MG RE SUPP
650.0000 mg | Freq: Four times a day (QID) | RECTAL | Status: DC | PRN
  Filled 2014-02-03: qty 1

## 2014-02-03 MED ORDER — ONDANSETRON HCL 4 MG/2ML IJ SOLN: 4.0000 mg | Freq: Four times a day (QID) | INTRAMUSCULAR | Status: DC | PRN

## 2014-02-03 MED ORDER — ONDANSETRON HCL 4 MG PO TABS: 4.0000 mg | ORAL_TABLET | Freq: Four times a day (QID) | ORAL | Status: DC | PRN

## 2014-02-03 MED ORDER — MORPHINE SULFATE 4 MG/ML IJ SOLN
4.0000 mg | INTRAMUSCULAR | Status: DC | PRN
  Administered 2014-02-03 (×3): 4 mg via INTRAVENOUS
  Filled 2014-02-03 (×3): qty 1

## 2014-02-03 NOTE — ED Provider Notes (Signed)
4:33 PM Assumed care of patient at change of shift from [facility], [place].  Patient with hx multiple abdominal surgeries and SBO p/w abdominal pain, N/V, not passing flatus or stool.  Concern for SBO.  CT abd/pelvis pending.    Pt seen and examined.  Discussed CT findings and plan with patient.  Pain and nausea medication, NG tube ordered.  Pt verbalizes understanding and agrees with plan.   5:14 PM Dr Lucia Gaskins has seen and examined the patient.  Requests medical admission.    Admitted to [facility].    Results for orders placed during the hospital encounter of 02/03/14  CBC WITH DIFFERENTIAL      Result Value Ref Range   WBC 10.0  4.0 - 10.5 K/uL   RBC 4.42  3.87 - 5.11 MIL/uL   Hemoglobin 13.8  12.0 - 15.0 g/dL   HCT 40.4  36.0 - 46.0 %   MCV 91.4  78.0 - 100.0 fL   MCH 31.2  26.0 - 34.0 pg   MCHC 34.2  30.0 - 36.0 g/dL   RDW 13.8  11.5 - 15.5 %   Platelets 237  150 - 400 K/uL   Neutrophils Relative % 86 (*) 43 - 77 %   Neutro Abs 8.7 (*) 1.7 - 7.7 K/uL   Lymphocytes Relative 7 (*) 12 - 46 %   Lymphs Abs 0.7  0.7 - 4.0 K/uL   Monocytes Relative 7  3 - 12 %   Monocytes Absolute 0.7  0.1 - 1.0 K/uL   Eosinophils Relative 0  0 - 5 %   Eosinophils Absolute 0.0  0.0 - 0.7 K/uL   Basophils Relative 0  0 - 1 %   Basophils Absolute 0.0  0.0 - 0.1 K/uL  COMPREHENSIVE METABOLIC PANEL      Result Value Ref Range   Sodium 140  137 - 147 mEq/L   Potassium 4.0  3.7 - 5.3 mEq/L   Chloride 102  96 - 112 mEq/L   CO2 24  19 - 32 mEq/L   Glucose, Bld 99  70 - 99 mg/dL   BUN 15  6 - 23 mg/dL   Creatinine, Ser 0.56  0.50 - 1.10 mg/dL   Calcium 9.3  8.4 - 10.5 mg/dL   Total Protein 7.0  6.0 - 8.3 g/dL   Albumin 3.7  3.5 - 5.2 g/dL   AST 20  0 - 37 U/L   ALT 11  0 - 35 U/L   Alkaline Phosphatase 58  39 - 117 U/L   Total Bilirubin 0.6  0.3 - 1.2 mg/dL   GFR calc non Af Amer 85 (*) >90 mL/min   GFR calc Af Amer >90  >90 mL/min   Anion gap 14  5 - 15  LIPASE, BLOOD      Result Value  Ref Range   Lipase 23  11 - 59 U/L  URINALYSIS, ROUTINE W REFLEX MICROSCOPIC      Result Value Ref Range   Color, Urine YELLOW  YELLOW   APPearance CLEAR  CLEAR   Specific Gravity, Urine 1.017  1.005 - 1.030   pH 6.0  5.0 - 8.0   Glucose, UA NEGATIVE  NEGATIVE mg/dL   Hgb urine dipstick NEGATIVE  NEGATIVE   Bilirubin Urine NEGATIVE  NEGATIVE   Ketones, ur >80 (*) NEGATIVE mg/dL   Protein, ur NEGATIVE  NEGATIVE mg/dL   Urobilinogen, UA 1.0  0.0 - 1.0 mg/dL   Nitrite NEGATIVE  NEGATIVE  Leukocytes, UA SMALL (*) NEGATIVE  URINE MICROSCOPIC-ADD ON      Result Value Ref Range   Squamous Epithelial / LPF FEW (*) RARE   WBC, UA 3-6  <3 WBC/hpf   Bacteria, UA RARE  RARE   Ct Abdomen Pelvis W Contrast  02/03/2014   CLINICAL DATA:  Did not feel well last night, vomiting today, abdominal pain 10 out of 10, unable to keep fluids down, no bowel movement for 2 days, flu shot 3 days ago, multiple prior abdominal surgeries, personal history of small bowel obstruction, diverticulitis, leiomyosarcoma  EXAM: CT ABDOMEN AND PELVIS WITH CONTRAST  TECHNIQUE: Multidetector CT imaging of the abdomen and pelvis was performed using the standard protocol following bolus administration of intravenous contrast. Sagittal and coronal MPR images reconstructed from axial data set.  CONTRAST:  150mL OMNIPAQUE IOHEXOL 300 MG/ML SOLN IV. Dilute oral contrast.  COMPARISON:  08/17/2011  FINDINGS: Dependent atelectasis at both lung bases.  2.0 x 1.2 cm cyst mid LEFT kidney image 27.  Liver, spleen, atrophic pancreas, kidneys, and adrenal glands otherwise normal.  Scattered atherosclerotic calcifications.  Sigmoid diverticulosis without evidence of diverticulitis.  Dilated proximal and decompressed distal small bowel loops compatible with small bowel obstruction.  Transition point in LEFT pelvis, images 59-62 with small bowel stool sign immediately proximal, best demonstrated on sagittal image 78.  Minimal wall thickening is seen  at the transition zone, uncertain if related to scarring, inflammation, less likely tumor.  No adenopathy, free air, or hernia.  Post hysterectomy with nonvisualization of ovaries and appendix.  Unremarkable bladder and ureters.  Scattered degenerative changes of the lumbar spine.  IMPRESSION: Distal small bowel obstruction with transition zone in the LEFT pelvis where a short segment of minimal wall thickening is identified, question adhesion, scarring, inflammation, less likely tumor.  Small amount of free pelvic fluid without evidence of perforation or abscess.  Distal colonic diverticulosis without evidence of diverticulitis.   Electronically Signed   By: Lavonia Dana M.D.   On: 02/03/2014 16:32      Clayton Bibles, PA-C 02/04/14 0007

## 2014-02-03 NOTE — Progress Notes (Signed)
Monica Ward arrived to the  Floor, A&O x 4,  C/o pain of 9/10,  Vitals signs WNL, oriented pt To unit and staff, meds given as order, NG tube connected to low wall suction as order, pt tolerating well. Pt at this time is refusing to wear heart monitor, notify the on call Physician, no new order received at this time. Pt also states she does not want to wear hospital gown tonight , she prefer to wear hers form home,

## 2014-02-03 NOTE — ED Notes (Signed)
Pt taken to CT.

## 2014-02-03 NOTE — ED Provider Notes (Signed)
CSN: [id number]     Arrival date & time 02/03/14  1103 History   First MD Initiated Contact with Patient 02/03/14 1119     Chief Complaint  Patient presents with  . Emesis  . Abdominal Pain     (Consider location/radiation/quality/duration/timing/severity/associated sxs/prior Treatment) HPI Comments: Patient with history of pelvic surgery in 1990's, SBO in 2011 -- presents with complaint of abdominal pain with onset last night approximately 10 PM. Patient did not feel better upon waking this morning and started vomiting. Patient has had multiple episodes of vomiting. Pain is epigastric/periumbilical and severe. It does not radiate. She has not had fever, diarrhea. She has not had a bowel movement in 2 days. She thinks that this may have been caused by food that she ate. No urinary symptoms such as dysuria or hematuria. No treatments prior to arrival. Onset of symptoms acute. Course is constant. Nothing makes symptoms better. Palpation makes the pain worse.  Patient is a 80 y.o. female presenting with vomiting and abdominal pain. The history is provided by the patient and medical records.  Emesis Associated symptoms: abdominal pain   Associated symptoms: no diarrhea, no headaches, no myalgias and no sore throat   Abdominal Pain Associated symptoms: constipation, nausea and vomiting   Associated symptoms: no chest pain, no cough, no diarrhea, no dysuria, no fever and no sore throat     Past Medical History  Diagnosis Date  . High cholesterol   . SBO (small bowel obstruction) 08/2009    Early, partial SBO improved with conservative Tx   . Diverticulitis     Per chart   . Cancer Around 2002-2003    Unclear history of "7 lb" abdominal tumor treated at [facility] ~2003 s/p open abdominal colonic resection and radiation  . Complication of anesthesia     "trouble waking up; too much med given" (01/28/2012)  . Arthritis     "probably" (01/28/2012)   Past Surgical History  Procedure Laterality Date   . Cesarean section  1968  . Tumor removal  1990's;  X 2    "benign"  . Abdominal hysterectomy  ~ 1985  . Appendectomy  1968  . Tonsillectomy      "when I was a child"  . Colectomy  2003    "cancer"   Family History  Problem Relation Age of Onset  . Heart failure Father 27    Heart failure  . Heart disease Father    History  Substance Use Topics  . Smoking status: Never Smoker   . Smokeless tobacco: Never Used  . Alcohol Use: Yes     Comment: social   OB History   Grav Para Term Preterm Abortions TAB SAB Ect Mult Living                 Review of Systems  Constitutional: Negative for fever.  HENT: Negative for rhinorrhea and sore throat.   Eyes: Negative for redness.  Respiratory: Negative for cough.   Cardiovascular: Negative for chest pain.  Gastrointestinal: Positive for nausea, vomiting, abdominal pain and constipation. Negative for diarrhea and blood in stool.  Genitourinary: Negative for dysuria.  Musculoskeletal: Negative for myalgias.  Skin: Negative for rash.  Neurological: Negative for headaches.   Allergies  Review of patient's allergies indicates no known allergies.  Home Medications   Prior to Admission medications   Medication Sig Start Date End Date Taking? Authorizing Provider  acetaminophen (TYLENOL) 325 MG tablet Take 325 mg by mouth every 6 (six) hours  as needed for moderate pain.   Yes Historical Provider, MD  aspirin 81 MG chewable tablet Chew 81 mg by mouth once.    Yes Historical Provider, MD  Cyanocobalamin (B-12 PO) Take 1 tablet by mouth daily.   Yes Historical Provider, MD  loperamide (IMODIUM) 2 MG capsule Take 1 capsule (2 mg total) by mouth 4 (four) times daily as needed for diarrhea or loose stools. 04/23/13  Yes Teressa Lower, MD  Multiple Vitamin (MULITIVITAMIN WITH MINERALS) TABS Take 1 tablet by mouth daily.   Yes Historical Provider, MD  Multiple Vitamins-Minerals (ZINC PO) Take 1 tablet by mouth daily.   Yes Historical Provider,  MD  Omega-3 Fatty Acids (OMEGA 3 PO) Take 1 capsule by mouth daily.   Yes Historical Provider, MD  Simethicone (GAS-X PO) Take 1 tablet by mouth once as needed. As needed for gas.   Yes Historical Provider, MD  VITAMIN E PO Take 1 tablet by mouth daily.   Yes Historical Provider, MD   BP 155/62  Pulse 62  Resp 20  SpO2 99%  Physical Exam  Nursing note and vitals reviewed. Constitutional: She appears well-developed and well-nourished.  HENT:  Head: Normocephalic and atraumatic.  Eyes: Conjunctivae are normal. Right eye exhibits no discharge. Left eye exhibits no discharge.  Neck: Normal range of motion. Neck supple.  Cardiovascular: Normal rate, regular rhythm and normal heart sounds.   Pulmonary/Chest: Effort normal and breath sounds normal.  Abdominal: Soft. She exhibits no distension. There is tenderness in the epigastric area and periumbilical area. There is no rigidity, no rebound, no guarding, no CVA tenderness, no tenderness at McBurney's point and negative Murphy's sign.  Hyperactive bowel sounds on the left, decreased on right.  Neurological: She is alert.  Skin: Skin is warm and dry.  Psychiatric: She has a normal mood and affect.    ED Course  Procedures (including critical care time) Labs Review Labs Reviewed  CBC WITH DIFFERENTIAL - Abnormal; Notable for the following:    Neutrophils Relative % 86 (*)    Neutro Abs 8.7 (*)    Lymphocytes Relative 7 (*)    All other components within normal limits  COMPREHENSIVE METABOLIC PANEL - Abnormal; Notable for the following:    GFR calc non Af Amer 85 (*)    All other components within normal limits  LIPASE, BLOOD  URINALYSIS, ROUTINE W REFLEX MICROSCOPIC    Imaging Review Ct Abdomen Pelvis W Contrast  02/03/2014   CLINICAL DATA:  Did not feel well last night, vomiting today, abdominal pain 10 out of 10, unable to keep fluids down, no bowel movement for 2 days, flu shot 3 days ago, multiple prior abdominal surgeries,  personal history of small bowel obstruction, diverticulitis, leiomyosarcoma  EXAM: CT ABDOMEN AND PELVIS WITH CONTRAST  TECHNIQUE: Multidetector CT imaging of the abdomen and pelvis was performed using the standard protocol following bolus administration of intravenous contrast. Sagittal and coronal MPR images reconstructed from axial data set.  CONTRAST:  169mL OMNIPAQUE IOHEXOL 300 MG/ML SOLN IV. Dilute oral contrast.  COMPARISON:  08/17/2011  FINDINGS: Dependent atelectasis at both lung bases.  2.0 x 1.2 cm cyst mid LEFT kidney image 27.  Liver, spleen, atrophic pancreas, kidneys, and adrenal glands otherwise normal.  Scattered atherosclerotic calcifications.  Sigmoid diverticulosis without evidence of diverticulitis.  Dilated proximal and decompressed distal small bowel loops compatible with small bowel obstruction.  Transition point in LEFT pelvis, images 59-62 with small bowel stool sign immediately proximal, best demonstrated on  sagittal image 78.  Minimal wall thickening is seen at the transition zone, uncertain if related to scarring, inflammation, less likely tumor.  No adenopathy, free air, or hernia.  Post hysterectomy with nonvisualization of ovaries and appendix.  Unremarkable bladder and ureters.  Scattered degenerative changes of the lumbar spine.  IMPRESSION: Distal small bowel obstruction with transition zone in the LEFT pelvis where a short segment of minimal wall thickening is identified, question adhesion, scarring, inflammation, less likely tumor.  Small amount of free pelvic fluid without evidence of perforation or abscess.  Distal colonic diverticulosis without evidence of diverticulitis.   Electronically Signed   By: Lavonia Dana M.D.   On: 02/03/2014 16:32     EKG Interpretation None      Patient seen and examined. Work-up initiated. Medications ordered. Will proceed with CT given age and history of abdominal surgeries.   Vital signs reviewed and are as follows: BP 155/62  Pulse  62  Resp 20  SpO2 99%  Patient discussed with Dr. Jeneen Rinks who will see.   4:38 PM Seen earlier by Dr. Jeneen Rinks. Pt reports she is feeling better from a pain standpoint. Pending CT.   Handoff to [place] PA-C at shift change.    MDM   Final diagnoses:  Small bowel obstruction   Admit.     Carlisle Cater, PA-C 02/03/14 1640

## 2014-02-03 NOTE — ED Notes (Signed)
Was asked by Manuela Schwartz in triage to get an ekg on pt, but pt refuses to get one because she just had one in July when she went to see a specialist. Claims that the doctor told her that "you shouldn't have even come because everything looks okay" I explained that we just want to make sure that there is nothing going on with your heart today, but she is avid about not getting an ekg.

## 2014-02-03 NOTE — ED Notes (Signed)
2nd attempt made to call report. RN unable to take report at this time. Marzetta Board, RN Hydrographic surveyor)  notified.

## 2014-02-03 NOTE — Consult Note (Signed)
Re:   Monica Ward DOB:   March 24, 1930 MRN:   [id number]  [facility] Consultation  ASSESSMENT AND PLAN: 1.  SBO  Plan:  Will need admission, IVF, repeat PE, and would repeat KUB tomorrow AM.  Not sure she needs a NGT right now, but if she vomits in the hospital, I would place one.  2.  History of "tumor" excised at [facility] - she got post op radiation tx.  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  3. Hypercholesterolemia 4. Blind in her left eye.  Chief Complaint  Patient presents with  . Emesis  . Abdominal Pain   REFERRING PHYSICIAN:  Clayton Bibles, [place], [place]  HISTORY OF PRESENT ILLNESS: Monica Ward is a 80 y.o. (DOB: [birth year])  white  female whose primary care physician is DAUB, STEVE A, MD and comes to the [facility] ER today for nausea, vomiting and abdominal pain.  Ms. Delconte had a tumor excised from her abdomen at [facility] around 2003.  She got post op radiation therapy.  She had a colonoscopy around 2004.  She has chronic constipation.  She has had at least 2 prior admissions for bowel obstruction.  But these have resolved without surgery.  She was doing well until last night when she developed abdominal pain and vomiting.  Her husband brought her to the [facility] ER today.  The last admission in [place] that I can find was for "costochrondritis" in 01/2012.  She was hospitalized in 5/18 - 09/16/2009 for SBO.  She denies other GI history other than constipation.  Other abdominal operations include C section and two operations by Dr. Nori Riis.  These pre date her surgery at [facility].    Past Medical History  Diagnosis Date  . High cholesterol   . SBO (small bowel obstruction) 08/2009    Early, partial SBO improved with conservative Tx   . Diverticulitis     Per chart   . Cancer Around 2002-2003    Unclear history of "7 lb" abdominal tumor treated at [facility] ~2003 s/p open abdominal colonic resection and radiation  .  Complication of anesthesia     "trouble waking up; too much med given" (01/28/2012)  . Arthritis     "probably" (01/28/2012)      Past Surgical History  Procedure Laterality Date  . Cesarean section  1968  . Tumor removal  1990's;  X 2    "benign"  . Abdominal hysterectomy  ~ 1985  . Appendectomy  1968  . Tonsillectomy      "when I was a child"  . Colectomy  2003    "cancer"      Current Facility-Administered Medications  Medication Dose Route Frequency Provider Last Rate Last Dose  . morphine 4 MG/ML injection 4 mg  4 mg Intravenous Q2H PRN Carlisle Cater, PA-C   4 mg at 02/03/14 1644  . ondansetron (ZOFRAN) injection 4 mg  4 mg Intravenous Once Clayton Bibles, PA-C       Current Outpatient Prescriptions  Medication Sig Dispense Refill  . acetaminophen (TYLENOL) 325 MG tablet Take 325 mg by mouth every 6 (six) hours as needed for moderate pain.      Marland Kitchen aspirin 81 MG chewable tablet Chew 81 mg by mouth once.       . Cyanocobalamin (B-12 PO) Take 1 tablet by mouth daily.      Marland Kitchen loperamide (IMODIUM) 2 MG capsule Take 1 capsule (2  mg total) by mouth 4 (four) times daily as needed for diarrhea or loose stools.  12 capsule  0  . Multiple Vitamin (MULITIVITAMIN WITH MINERALS) TABS Take 1 tablet by mouth daily.      . Multiple Vitamins-Minerals (ZINC PO) Take 1 tablet by mouth daily.      . Omega-3 Fatty Acids (OMEGA 3 PO) Take 1 capsule by mouth daily.      . Simethicone (GAS-X PO) Take 1 tablet by mouth once as needed. As needed for gas.      Marland Kitchen VITAMIN E PO Take 1 tablet by mouth daily.         No Known Allergies  REVIEW OF SYSTEMS: Skin:  No history of rash.  No history of abnormal moles. Infection:  No history of hepatitis or HIV.  No history of MRSA. Neurologic:  No history of stroke.  No history of seizure.  No history of headaches. Cardiac:  No history of hypertension. No history of heart disease.  Hospitalized for chest pain in 2013.  Proved to be non cardiac. Pulmonary:  Does  not smoke cigarettes.  No asthma or bronchitis.  No OSA/CPAP.  Endocrine:  No diabetes. No thyroid disease.  Hypercholesterolemia. Gastrointestinal:   See HPI. Urologic:  No history of kidney stones.  No history of bladder infections. Musculoskeletal:  No history of joint or back disease. Hematologic:  No bleeding disorder.  No history of anemia.  Not anticoagulated. Psycho-social:  The patient is oriented.   The patient has no obvious psychologic or social impairment to understanding our conversation and plan.  SOCIAL and FAMILY HISTORY: Married. Husband, Margarita Grizzle, is at home:  [phone].  She said that he had mild dementia, but he drives. She has three children:  Ages 66, 83, 18.  They live in [place], [place], and [place]. Of note, she and her husband used to spend 1/2 their time in [place] and 1/2 their time in [place].  They may stop doing that now.  PHYSICAL EXAM: BP 106/53  Pulse 49  Temp(Src) 97.9 F (36.6 C) (Oral)  Resp 14  SpO2 97%  General: Older thin WF who is alert. HEENT: Normal. Neck: Supple. No mass.  No thyroid mass. Lymph Nodes:  No supraclavicular or cervical nodes. Lungs: Clear to auscultation and symmetric breath sounds. Heart:  RRR. No murmur or rub. Abdomen: Soft. No mass.  No hernia. Normal bowel sounds.  Long mid line scar.  No tenderness or peritoneal signs. Rectal: Not done. Extremities:  Good strength and ROM  in upper and lower extremities. Neurologic:  Grossly intact to motor and sensory function. Psychiatric: Has normal mood and affect. Behavior is normal.   DATA REVIEWED: Epic notes.  Alphonsa Overall, MD,  [facility], [address]  [place], [place]    [place] Phone:  [phone] FAX:  [phone]

## 2014-02-03 NOTE — ED Provider Notes (Signed)
Patient seen and evaluated. States he feels considerably better. His benign abdominal exam active bowel sounds. Has not passed gas or have bowel movement. Imaging is pending. She initially felt similar to previous episodes of small bowel obstruction. However, now states she feels "much much better. For imaging shows no sign of obstruction she may be a discharge home if her symptoms stay resolved.  Tanna Furry, MD 02/03/14 [phone]

## 2014-02-03 NOTE — ED Notes (Signed)
Put pt on bedpan again and was unable to void

## 2014-02-03 NOTE — ED Notes (Signed)
Put pt on bedpan, but was unable to void. Will try again when she feels like she needs to.

## 2014-02-03 NOTE — ED Notes (Signed)
Pt from home reports that she did not feel well last pm. Pt actively vomiting in waiting room and triage. Pt reports that she has had "multiple surgeries on my stomach" that includes cancer. Pt reports abd pain 10/10. Pt unable to keep fluids down this morning. Pt denies diarrhea or fever. Pt states that she has not had a BM x2 days and she had a flu shot 3 days ago. Pt adds that she is allergic to MSG and it could have been in her food last pm. Pt is A&O and in NAD

## 2014-02-03 NOTE — ED Notes (Signed)
Lucia Gaskins, MD (General surgery) at bedside

## 2014-02-03 NOTE — Progress Notes (Signed)
  CARE MANAGEMENT ED NOTE 02/03/2014  Patient:  Monica Ward, Monica Ward   Account Number:  [id number]  Date Initiated:  02/03/2014  Documentation initiated by:  Livia Snellen  Subjective/Objective Assessment:   Patient presents to Ed with abdominal pain, nausea and vomiting.     Subjective/Objective Assessment Detail:   Patient with pmhx of high cholesterol, SBO, diverticulitis, seven pound cancerous tumor removed from abdomen in 2003 at [facility]     Action/Plan:   Action/Plan Detail:   Anticipated DC Date:       Status Recommendation to Physician:   Result of Recommendation:    Other ED Services  Consult Working [place]  Other    Choice offered to / List presented to:            Status of service:  Completed, signed off  ED Comments:   ED Comments Detail:  EDCM spoke to patient at bedside.  Patient reports she lives with her husband at home.  Patient does not have home health services at this time or medical equipment at home. Patient reports she is abloe to perform her own ADL's without difficulty.  Patient confirms her pcp is [facility] [address].  Patient reports the last time she saw Dr. Everlene Farrier was in July 2015 for a wellness exam.  Patient reports she is without home health needs at this time.  No further EDCM needs at this time.

## 2014-02-03 NOTE — H&P (Signed)
[facility] History and Physical  Monica Ward KYH:[id number] DOB: 03-27-1930 DOA: 02/03/2014  Referring physician: ED PCP: [name], MD   Chief Complaint:  Abdominal pain with nausea since one day   HPI:  80 year old female with history of multiple bowel surgeries, small bowel obstruction in 2011, chronic constipation, colonic diverticulosis who presented to the ED with  Diffuse abdominal pain since last evening. Patient reports acute onset of left lower quadrant abdominal pain while watching TV last evening associated with severe nausea but no vomiting. Patient put her fingers inside her mouth to vomit but did not get anything out. She then had generalized abdominal pain which was dull aching pain without any aggravating or relieving factors. She reports the symptoms to be similar to when she had small bowel obstruction few years back. Patient reports chronic constipation and had a small bowel movement 2 days back. She denied any fever, chills, headache, blurred vision, dizziness, chest pain, palpitations, shortness of breath, urinary symptoms or diarrhea. Denies any blood in stool. She denies any recent illness or change in her medications. She is not on any narcotics. Denies recent change in weight or appetite  In the ED patient's vitals were stable except for mild bradycardia. Blood work done showed WBC of 10, hemoglobin of 13.8 and normal chemistry. CT scan of the abdomen and pelvis showed distal small bowel obstruction with transition zone in the left pelvis likely due to adhesions versus scarring versus inflammation. Also showed a small amount of free pelvic fluid without perforation or abscess. Distal colonic diverticulosis without diverticulitis was noted.  Patient was given 4 mg IV morphine and 4 mg IV Zofran after which her pain improved but still had nausea. Patient seen by general surgery and recommended admission on observation to medicine with no need for acute  surgical intervention. On my evaluation patient reported abdominal pain all 5/10 in intensity with nausea. NG tube placed in the ED.  Review of Systems:  Constitutional: Denies fever, chills, diaphoresis, appetite change and fatigue.  HEENT: Denies visual or hearing symptoms, denied congestion, difficulty swallowing or neck pain Respiratory: Denies SOB, DOE, cough, chest tightness,  and wheezing.   Cardiovascular: Denies chest pain, palpitations and leg swelling.  Gastrointestinal: Abdominal pain, nausea, chronic constipation, Denies vomiting, diarrhea,  blood in stool and abdominal distention.  Genitourinary: Denies dysuria, hematuria, flank pain and difficulty urinating.  Musculoskeletal: Denies myalgias, back pain, Skin: Denies pallor, rash and wound.  Neurological: Denies dizziness, , weakness, light-headedness, numbness and headaches.  Psychiatric/Behavioral: Denies  confusion   Past Medical History  Diagnosis Date  . High cholesterol   . SBO (small bowel obstruction) 08/2009    Early, partial SBO improved with conservative Tx   . Diverticulitis     Per chart   . Cancer Around 2002-2003    Unclear history of "7 lb" abdominal tumor treated at [facility] ~2003 s/p open abdominal colonic resection and radiation  . Complication of anesthesia     "trouble waking up; too much med given" (01/28/2012)  . Arthritis     "probably" (01/28/2012)   Past Surgical History  Procedure Laterality Date  . Cesarean section  1968  . Tumor removal  1990's;  X 2    "benign"  . Abdominal hysterectomy  ~ 1985  . Appendectomy  1968  . Tonsillectomy      "when I was a child"  . Colectomy  2003    "cancer"   Social History:  reports that she has never  smoked. She has never used smokeless tobacco. She reports that she drinks alcohol. She reports that she does not use illicit drugs.  No Known Allergies  Family History  Problem Relation Age of Onset  . Heart failure Father 15    Heart failure  . Heart  disease Father     Prior to Admission medications   Medication Sig Start Date End Date Taking? Authorizing Provider  acetaminophen (TYLENOL) 325 MG tablet Take 325 mg by mouth every 6 (six) hours as needed for moderate pain.   Yes Historical Provider, MD  aspirin 81 MG chewable tablet Chew 81 mg by mouth once.    Yes Historical Provider, MD  Cyanocobalamin (B-12 PO) Take 1 tablet by mouth daily.   Yes Historical Provider, MD  loperamide (IMODIUM) 2 MG capsule Take 1 capsule (2 mg total) by mouth 4 (four) times daily as needed for diarrhea or loose stools. 04/23/13  Yes Teressa Lower, MD  Multiple Vitamin (MULITIVITAMIN WITH MINERALS) TABS Take 1 tablet by mouth daily.   Yes Historical Provider, MD  Multiple Vitamins-Minerals (ZINC PO) Take 1 tablet by mouth daily.   Yes Historical Provider, MD  Omega-3 Fatty Acids (OMEGA 3 PO) Take 1 capsule by mouth daily.   Yes Historical Provider, MD  Simethicone (GAS-X PO) Take 1 tablet by mouth once as needed. As needed for gas.   Yes Historical Provider, MD  VITAMIN E PO Take 1 tablet by mouth daily.   Yes Historical Provider, MD     Physical Exam:  Filed Vitals:   02/03/14 1114 02/03/14 1331 02/03/14 1345  BP: 155/62  106/53  Pulse: 62  49  Temp:  97.9 F (36.6 C) 97.9 F (36.6 C)  TempSrc:  Oral Oral  Resp: 20  14  SpO2: 99%  97%    Constitutional: Vital signs reviewed.  Elderly thin built female lying in bed in no acute distress. HEENT: no pallor, no icterus, dry oral mucosa Cardiovascular: RRR, S1 normal, S2 normal, no MRG Chest: CTAB, no wheezes, rales, or rhonchi Abdominal: Soft. Nondistended, bowel sounds sluggish, diffuse lower abdominal tenderness more pronounced over her left lower quadrant, no guarding or rigidity Ext: warm, no edema Neurological: Alert and oriented  Labs on Admission:  Basic Metabolic Panel:  Recent Labs Lab 02/03/14 1211  NA 140  K 4.0  CL 102  CO2 24  GLUCOSE 99  BUN 15  CREATININE 0.56  CALCIUM  9.3   Liver Function Tests:  Recent Labs Lab 02/03/14 1211  AST 20  ALT 11  ALKPHOS 58  BILITOT 0.6  PROT 7.0  ALBUMIN 3.7    Recent Labs Lab 02/03/14 1211  LIPASE 23   No results found for this basename: AMMONIA,  in the last 168 hours CBC:  Recent Labs Lab 02/03/14 1211  WBC 10.0  NEUTROABS 8.7*  HGB 13.8  HCT 40.4  MCV 91.4  PLT 237   Cardiac Enzymes: No results found for this basename: CKTOTAL, CKMB, CKMBINDEX, TROPONINI,  in the last 168 hours BNP: No components found with this basename: POCBNP,  CBG: No results found for this basename: GLUCAP,  in the last 168 hours  Radiological Exams on Admission: Ct Abdomen Pelvis W Contrast  02/03/2014   CLINICAL DATA:  Did not feel well last night, vomiting today, abdominal pain 10 out of 10, unable to keep fluids down, no bowel movement for 2 days, flu shot 3 days ago, multiple prior abdominal surgeries, personal history of small bowel obstruction, diverticulitis,  leiomyosarcoma  EXAM: CT ABDOMEN AND PELVIS WITH CONTRAST  TECHNIQUE: Multidetector CT imaging of the abdomen and pelvis was performed using the standard protocol following bolus administration of intravenous contrast. Sagittal and coronal MPR images reconstructed from axial data set.  CONTRAST:  154mL OMNIPAQUE IOHEXOL 300 MG/ML SOLN IV. Dilute oral contrast.  COMPARISON:  08/17/2011  FINDINGS: Dependent atelectasis at both lung bases.  2.0 x 1.2 cm cyst mid LEFT kidney image 27.  Liver, spleen, atrophic pancreas, kidneys, and adrenal glands otherwise normal.  Scattered atherosclerotic calcifications.  Sigmoid diverticulosis without evidence of diverticulitis.  Dilated proximal and decompressed distal small bowel loops compatible with small bowel obstruction.  Transition point in LEFT pelvis, images 59-62 with small bowel stool sign immediately proximal, best demonstrated on sagittal image 78.  Minimal wall thickening is seen at the transition zone, uncertain if  related to scarring, inflammation, less likely tumor.  No adenopathy, free air, or hernia.  Post hysterectomy with nonvisualization of ovaries and appendix.  Unremarkable bladder and ureters.  Scattered degenerative changes of the lumbar spine.  IMPRESSION: Distal small bowel obstruction with transition zone in the LEFT pelvis where a short segment of minimal wall thickening is identified, question adhesion, scarring, inflammation, less likely tumor.  Small amount of free pelvic fluid without evidence of perforation or abscess.  Distal colonic diverticulosis without evidence of diverticulitis.   Electronically Signed   By: Lavonia Dana M.D.   On: 02/03/2014 16:32      Assessment/Plan  Principal Problem:   SBO (small bowel obstruction) Admit to MedSurg. Possibly secondary to adhesions from multiple prior bowel surgeries. -Keep n.p.o. NG tube placed to wall suction. Monitor with serial abdominal exam. Supportive care with antiemetics, IV fluids and when necessary IV morphine for pain control. -[facility] following. Repeat abdominal x-ray in the morning.   Active Problems: Chronic constipation Needs aggressive bowel regimen upon discharge.  Hold all home medications while patient is n.p.o.    Diet: N.p.o.  DVT prophylaxis: sq lovenox   Code Status: full code Family Communication: None at bedside Disposition Plan: Home once improved  DHUNGEL, [place] [facility] Pager [phone]  Total time spent on admission :50 minutes  If 7PM-7AM, please contact night-coverage [email or web address] Password [email or web address] 02/03/2014, 5:51 PM

## 2014-02-03 NOTE — ED Notes (Signed)
RN states she will call back to receive report shortly.

## 2014-02-04 ENCOUNTER — Inpatient Hospital Stay (HOSPITAL_COMMUNITY): Payer: Medicare Other

## 2014-02-04 ENCOUNTER — Encounter (HOSPITAL_COMMUNITY): Payer: Self-pay

## 2014-02-04 DIAGNOSIS — K5900 Constipation, unspecified: Secondary | ICD-10-CM

## 2014-02-04 DIAGNOSIS — K5669 Other intestinal obstruction: Secondary | ICD-10-CM

## 2014-02-04 LAB — CBC
HCT: 38.3 % (ref 36.0–46.0)
Hemoglobin: 12.6 g/dL (ref 12.0–15.0)
MCH: 30.8 pg (ref 26.0–34.0)
MCHC: 32.9 g/dL (ref 30.0–36.0)
MCV: 93.6 fL (ref 78.0–100.0)
Platelets: 195 10*3/uL (ref 150–400)
RBC: 4.09 MIL/uL (ref 3.87–5.11)
RDW: 13.9 % (ref 11.5–15.5)
WBC: 6.9 10*3/uL (ref 4.0–10.5)

## 2014-02-04 LAB — BASIC METABOLIC PANEL
Anion gap: 16 — ABNORMAL HIGH (ref 5–15)
BUN: 17 mg/dL (ref 6–23)
CO2: 22 mEq/L (ref 19–32)
Calcium: 8.5 mg/dL (ref 8.4–10.5)
Chloride: 102 mEq/L (ref 96–112)
Creatinine, Ser: 0.59 mg/dL (ref 0.50–1.10)
GFR calc Af Amer: >90 mL/min (ref >90)
GFR calc non Af Amer: 83 mL/min — ABNORMAL LOW (ref >90)
Glucose, Bld: 70 mg/dL (ref 70–99)
Potassium: 3.7 mEq/L (ref 3.7–5.3)
Sodium: 140 mEq/L (ref 137–147)

## 2014-02-04 LAB — MAGNESIUM: Magnesium: 2 mg/dL (ref 1.5–2.5)

## 2014-02-04 MED ORDER — ALUM & MAG HYDROXIDE-SIMETH 200-200-20 MG/5ML PO SUSP: 30.0000 mL | Freq: Four times a day (QID) | ORAL | Status: DC | PRN

## 2014-02-04 MED ORDER — DIPHENHYDRAMINE HCL 50 MG/ML IJ SOLN: 12.5000 mg | Freq: Four times a day (QID) | INTRAMUSCULAR | Status: DC | PRN

## 2014-02-04 MED ORDER — BISACODYL 10 MG RE SUPP
10.0000 mg | Freq: Every day | RECTAL | Status: DC
Start: 2014-02-04 — End: 2014-02-05
  Administered 2014-02-04: 10 mg via RECTAL
  Filled 2014-02-04: qty 1

## 2014-02-04 MED ORDER — MENTHOL 3 MG MT LOZG
1.0000 | LOZENGE | OROMUCOSAL | Status: DC | PRN
  Filled 2014-02-04: qty 9

## 2014-02-04 MED ORDER — LIP MEDEX EX OINT
1.0000 | TOPICAL_OINTMENT | Freq: Two times a day (BID) | CUTANEOUS | Status: DC
Start: 2014-02-04 — End: 2014-02-06
  Administered 2014-02-04 – 2014-02-06 (×5): 1 via TOPICAL
  Filled 2014-02-04: qty 7

## 2014-02-04 MED ORDER — PHENOL 1.4 % MT LIQD
2.0000 | OROMUCOSAL | Status: DC | PRN
  Filled 2014-02-04: qty 177

## 2014-02-04 MED ORDER — LACTATED RINGERS IV BOLUS (SEPSIS): 1000.0000 mL | Freq: Three times a day (TID) | INTRAVENOUS | Status: AC | PRN

## 2014-02-04 MED ORDER — LORAZEPAM 2 MG/ML IJ SOLN: 0.5000 mg | Freq: Three times a day (TID) | INTRAMUSCULAR | Status: DC | PRN

## 2014-02-04 MED ORDER — MORPHINE SULFATE 2 MG/ML IJ SOLN: 2.0000 mg | INTRAMUSCULAR | Status: DC | PRN

## 2014-02-04 MED ORDER — POTASSIUM CHLORIDE CRYS ER 20 MEQ PO TBCR
40.0000 meq | EXTENDED_RELEASE_TABLET | Freq: Once | ORAL | Status: AC
  Administered 2014-02-04: 40 meq via ORAL
  Filled 2014-02-04: qty 2

## 2014-02-04 MED ORDER — MAGIC MOUTHWASH
15.0000 mL | Freq: Four times a day (QID) | ORAL | Status: DC | PRN
  Administered 2014-02-05: 15 mL via ORAL
  Filled 2014-02-04: qty 15

## 2014-02-04 MED ORDER — PROMETHAZINE HCL 25 MG/ML IJ SOLN: 6.2500 mg | INTRAMUSCULAR | Status: DC | PRN

## 2014-02-04 NOTE — Progress Notes (Signed)
[facility] PROGRESS NOTE  Monica Ward ZOX:[id number] DOB: August 25, 1929 DOA: 02/03/2014 PCP: [name], MD  Assessment/Plan: #1 small bowel obstruction Likely secondary to adhesions. Patient with clinical improvement. No abdominal pain. Patient states passing flatus. KUB shows improvement with small bowel obstruction. NG tube has been clamped. Continue supportive care. Patient has been started on clear liquids per general surgery. Patient has been placed on a suppository. General surgery following and appreciate input and recommendations.  #2 chronic constipation Will likely need aggressive bowel regimen on discharge.  #3 hyperlipidemia Omega -3 on hold. Follow.  #4 prophylaxis Lovenox for DVT prophylaxis.  Code Status: Full Family Communication: Updated patient no family present. Disposition Plan: Home when small bowel obstruction has resolved and having bowel movements.   Consultants:  General surgery: Dr. Lucia Gaskins 02/03/2014  Procedures:  CT abdomen and pelvis 02/03/2014  KUB 02/04/2014  Antibiotics:  None  HPI/Subjective: Patient states she's feeling better. No abdominal pain. No nausea. No vomiting. Patient states he is passing gas. Patient denies any bowel movement.  Objective: Filed Vitals:   02/04/14 0626  BP: 133/58  Pulse: 70  Temp: 97.7 F (36.5 C)  Resp: 16    Intake/Output Summary (Last 24 hours) at 02/04/14 1147 Last data filed at 02/04/14 0908  Gross per 24 hour  Intake      0 ml  Output    250 ml  Net   -250 ml   Filed Weights   02/03/14 1900  Weight: 54.114 kg (119 lb 4.8 oz)    Exam:   General:  NAD. NG tube clamped  Cardiovascular: RRR  Respiratory: CTAB  Abdomen: Soft, nontender, nondistended, positive bowel sounds.  Musculoskeletal: No clubbing cyanosis or edema.  Data Reviewed: Basic Metabolic Panel:  Recent Labs Lab 02/03/14 1211 02/04/14 0810  NA 140 140  K 4.0 3.7  CL 102 102  CO2 24 22  GLUCOSE  99 70  BUN 15 17  CREATININE 0.56 0.59  CALCIUM 9.3 8.5  MG  --  2.0   Liver Function Tests:  Recent Labs Lab 02/03/14 1211  AST 20  ALT 11  ALKPHOS 58  BILITOT 0.6  PROT 7.0  ALBUMIN 3.7    Recent Labs Lab 02/03/14 1211  LIPASE 23   No results found for this basename: AMMONIA,  in the last 168 hours CBC:  Recent Labs Lab 02/03/14 1211 02/04/14 0810  WBC 10.0 6.9  NEUTROABS 8.7*  --   HGB 13.8 12.6  HCT 40.4 38.3  MCV 91.4 93.6  PLT 237 195   Cardiac Enzymes: No results found for this basename: CKTOTAL, CKMB, CKMBINDEX, TROPONINI,  in the last 168 hours BNP (last 3 results) No results found for this basename: PROBNP,  in the last 8760 hours CBG: No results found for this basename: GLUCAP,  in the last 168 hours  No results found for this or any previous visit (from the past 240 hour(s)).   Studies: Ct Abdomen Pelvis W Contrast  02/03/2014   CLINICAL DATA:  Did not feel well last night, vomiting today, abdominal pain 10 out of 10, unable to keep fluids down, no bowel movement for 2 days, flu shot 3 days ago, multiple prior abdominal surgeries, personal history of small bowel obstruction, diverticulitis, leiomyosarcoma  EXAM: CT ABDOMEN AND PELVIS WITH CONTRAST  TECHNIQUE: Multidetector CT imaging of the abdomen and pelvis was performed using the standard protocol following bolus administration of intravenous contrast. Sagittal and coronal MPR images reconstructed from axial data  set.  CONTRAST:  171mL OMNIPAQUE IOHEXOL 300 MG/ML SOLN IV. Dilute oral contrast.  COMPARISON:  08/17/2011  FINDINGS: Dependent atelectasis at both lung bases.  2.0 x 1.2 cm cyst mid LEFT kidney image 27.  Liver, spleen, atrophic pancreas, kidneys, and adrenal glands otherwise normal.  Scattered atherosclerotic calcifications.  Sigmoid diverticulosis without evidence of diverticulitis.  Dilated proximal and decompressed distal small bowel loops compatible with small bowel obstruction.   Transition point in LEFT pelvis, images 59-62 with small bowel stool sign immediately proximal, best demonstrated on sagittal image 78.  Minimal wall thickening is seen at the transition zone, uncertain if related to scarring, inflammation, less likely tumor.  No adenopathy, free air, or hernia.  Post hysterectomy with nonvisualization of ovaries and appendix.  Unremarkable bladder and ureters.  Scattered degenerative changes of the lumbar spine.  IMPRESSION: Distal small bowel obstruction with transition zone in the LEFT pelvis where a short segment of minimal wall thickening is identified, question adhesion, scarring, inflammation, less likely tumor.  Small amount of free pelvic fluid without evidence of perforation or abscess.  Distal colonic diverticulosis without evidence of diverticulitis.   Electronically Signed   By: Lavonia Dana M.D.   On: 02/03/2014 16:32   Dg Abd 2 Views  02/04/2014   CLINICAL DATA:  Subsequent encounter for small bowel obstruction .  EXAM: ABDOMEN - 2 VIEW  COMPARISON:  CT abdomen and pelvis 02/03/2014.  FINDINGS: Chronic interstitial coarsening is present at the lung bases bilaterally. The heart is mildly enlarged. The side port of the NG tube is just above the GE junction and could be advanced for more optimal positioning.  Supine and upright views the abdomen demonstrate a nonspecific bowel gas pattern. The previously seen obstruction is no longer evident. Surgical clips are present within the anatomic pelvis. Degenerative changes are noted in the lower lumbar spine. Contrast remains within the urinary bladder.  IMPRESSION: 1. Interval decompression of the small bowel without evidence for residual or recurrent obstruction. 2. The side port of the NG tube it is near the level of the GE junction and could be advanced for more optimal positioning. 3. Degenerative changes of the lower lumbar spine. 4. Chronic interstitial coarsening at the lung bases bilaterally.   Electronically  Signed   By: Lawrence Santiago M.D.   On: 02/04/2014 08:54    Scheduled Meds: . bisacodyl  10 mg Rectal Daily  . enoxaparin (LOVENOX) injection  40 mg Subcutaneous Q24H  . lip balm  1 application Topical BID  . sodium chloride  3 mL Intravenous Q12H   Continuous Infusions: . sodium chloride 50 mL/hr at 02/04/14 1128    Principal Problem:   SBO (small bowel obstruction) Active Problems:   Diverticulosis   B12 deficiency   Constipation   Partial small bowel obstruction    Time spent: 29 mins    [facility] MD [facility] Pager [phone]. If 7PM-7AM, please contact night-coverage at [email or web address], password [email or web address] 02/04/2014, 11:47 AM  LOS: 1 day

## 2014-02-04 NOTE — Progress Notes (Signed)
Monica Ward  [address] [place], [place] [address] Phone: [phone] FAX: [place] [phone] 06-Sep-1929  CARE TEAM:  PCP: [name], MD  Outpatient Care Team: Patient Care Team: Darlyne Russian, MD as PCP - General (Family Medicine)  Inpatient Treatment Team: Treatment Team: Attending Provider: Eugenie Filler, MD; Consulting Physician: Nolon Nations, MD; Registered Nurse: Vivi Martens, RN; Registered Nurse: Milta Deiters, RN; Rounding Team: Ian Bushman, MD; Technician: Rhae Hammock, NT; Registered Nurse: Sonda Rumble, RN   Subjective:  Feels better Wants to see Xrays Wants to home Worried about her husband whom she takes care of - tearful but consolable  Objective:  Vital signs:  Filed Vitals:   02/03/14 1837 02/03/14 1900 02/03/14 2153 02/04/14 0626  BP: 107/43 146/54 117/57 133/58  Pulse: 57 64  70  Temp: 98 F (36.7 C) 98.2 F (36.8 C) 98.2 F (36.8 C) 97.7 F (36.5 C)  TempSrc: Oral Oral Oral Oral  Resp: 18 18 16 16   Height:  5' 4"  (1.626 m)    Weight:  119 lb 4.8 oz (54.114 kg)    SpO2: 94% 100% 98% 99%    Last BM Date: 02/01/14  Intake/Output   Yesterday:  10/09 0701 - 10/10 0700 In: -  Out: 250 [Emesis/NG output:250] This shift:     Bowel function:  Flatus: y  BM: n  Drain: thin NGT output  Physical Exam:  General: Pt awake/alert/oriented x4 in no acute distress Eyes: PERRL, normal EOM.  Sclera clear.  No icterus Neuro: CN II-XII intact w/o focal sensory/motor deficits. Lymph: No head/neck/groin lymphadenopathy Psych:  No delerium/psychosis/paranoia HENT: Normocephalic, Mucus membranes moist.  No thrush Neck: Supple, No tracheal deviation Chest: No chest wall pain w good excursion CV:  Pulses intact.  Regular rhythm MS: Normal AROM mjr joints.  No obvious deformity Abdomen: Soft.  Nondistended.  Nontender.  No evidence of peritonitis.  No  incarcerated hernias. Ext:  SCDs BLE.  No mjr edema.  No cyanosis Skin: No petechiae / purpura   Problem List:   Principal Problem:   SBO (small bowel obstruction) Active Problems:   Diverticulosis   B12 deficiency   Constipation   Partial small bowel obstruction   Assessment  Monica Ward  80 y.o. female       PSBO resolving  Plan:  -clamp NGT trial w liquids - d/c NGT later today if tol 2 liquid meals.  Adv to pureed in AM if doing really well -showed Xrays & discussed SBO.  I updated the patient's status to the patient.  Recommendations were made.  Questions were answered.  The patient expressed understanding & appreciation. -VTE prophylaxis- SCDs, etc -mobilize as tolerated to help recovery  D/C patient from hospital when patient meets criteria (anticipate in 1-2 day(s)):  Tolerating oral intake well Ambulating in walkways Adequate pain control without IV medications Urinating  Having flatus   [name], M.D., F.A.C.S. Gastrointestinal and Minimally Invasive Surgery [facility] [address], [place], [place] [address] [phone] Main / Paging   02/04/2014   Results:   Labs: Results for orders placed during the hospital encounter of 02/03/14 (from the past 48 hour(s))  CBC WITH DIFFERENTIAL     Status: Abnormal   Collection Time    02/03/14 12:11 PM      Result Value Ref Range   WBC 10.0  4.0 - 10.5  K/uL   RBC 4.42  3.87 - 5.11 MIL/uL   Hemoglobin 13.8  12.0 - 15.0 g/dL   HCT 40.4  36.0 - 46.0 %   MCV 91.4  78.0 - 100.0 fL   MCH 31.2  26.0 - 34.0 pg   MCHC 34.2  30.0 - 36.0 g/dL   RDW 13.8  11.5 - 15.5 %   Platelets 237  150 - 400 K/uL   Neutrophils Relative % 86 (*) 43 - 77 %   Neutro Abs 8.7 (*) 1.7 - 7.7 K/uL   Lymphocytes Relative 7 (*) 12 - 46 %   Lymphs Abs 0.7  0.7 - 4.0 K/uL   Monocytes Relative 7  3 - 12 %   Monocytes Absolute 0.7  0.1 - 1.0 K/uL   Eosinophils Relative 0  0 - 5 %    Eosinophils Absolute 0.0  0.0 - 0.7 K/uL   Basophils Relative 0  0 - 1 %   Basophils Absolute 0.0  0.0 - 0.1 K/uL  COMPREHENSIVE METABOLIC PANEL     Status: Abnormal   Collection Time    02/03/14 12:11 PM      Result Value Ref Range   Sodium 140  137 - 147 mEq/L   Potassium 4.0  3.7 - 5.3 mEq/L   Chloride 102  96 - 112 mEq/L   CO2 24  19 - 32 mEq/L   Glucose, Bld 99  70 - 99 mg/dL   BUN 15  6 - 23 mg/dL   Creatinine, Ser 0.56  0.50 - 1.10 mg/dL   Calcium 9.3  8.4 - 10.5 mg/dL   Total Protein 7.0  6.0 - 8.3 g/dL   Albumin 3.7  3.5 - 5.2 g/dL   AST 20  0 - 37 U/L   ALT 11  0 - 35 U/L   Alkaline Phosphatase 58  39 - 117 U/L   Total Bilirubin 0.6  0.3 - 1.2 mg/dL   GFR calc non Af Amer 85 (*) >90 mL/min   GFR calc Af Amer >90  >90 mL/min   Comment: (NOTE)     The eGFR has been calculated using the CKD EPI equation.     This calculation has not been validated in all clinical situations.     eGFR's persistently <90 mL/min signify possible Chronic Kidney     Disease.   Anion gap 14  5 - 15  LIPASE, BLOOD     Status: None   Collection Time    02/03/14 12:11 PM      Result Value Ref Range   Lipase 23  11 - 59 U/L  URINALYSIS, ROUTINE W REFLEX MICROSCOPIC     Status: Abnormal   Collection Time    02/03/14  4:05 PM      Result Value Ref Range   Color, Urine YELLOW  YELLOW   APPearance CLEAR  CLEAR   Specific Gravity, Urine 1.017  1.005 - 1.030   pH 6.0  5.0 - 8.0   Glucose, UA NEGATIVE  NEGATIVE mg/dL   Hgb urine dipstick NEGATIVE  NEGATIVE   Bilirubin Urine NEGATIVE  NEGATIVE   Ketones, ur >80 (*) NEGATIVE mg/dL   Protein, ur NEGATIVE  NEGATIVE mg/dL   Urobilinogen, UA 1.0  0.0 - 1.0 mg/dL   Nitrite NEGATIVE  NEGATIVE   Leukocytes, UA SMALL (*) NEGATIVE  URINE MICROSCOPIC-ADD ON     Status: Abnormal   Collection Time    02/03/14  4:05 PM  Result Value Ref Range   Squamous Epithelial / LPF FEW (*) RARE   WBC, UA 3-6  <3 WBC/hpf   Bacteria, UA RARE  RARE  BASIC  METABOLIC PANEL     Status: Abnormal   Collection Time    02/04/14  8:10 AM      Result Value Ref Range   Sodium 140  137 - 147 mEq/L   Potassium 3.7  3.7 - 5.3 mEq/L   Chloride 102  96 - 112 mEq/L   CO2 22  19 - 32 mEq/L   Glucose, Bld 70  70 - 99 mg/dL   BUN 17  6 - 23 mg/dL   Creatinine, Ser 0.59  0.50 - 1.10 mg/dL   Calcium 8.5  8.4 - 10.5 mg/dL   GFR calc non Af Amer 83 (*) >90 mL/min   GFR calc Af Amer >90  >90 mL/min   Comment: (NOTE)     The eGFR has been calculated using the CKD EPI equation.     This calculation has not been validated in all clinical situations.     eGFR's persistently <90 mL/min signify possible Chronic Kidney     Disease.   Anion gap 16 (*) 5 - 15  CBC     Status: None   Collection Time    02/04/14  8:10 AM      Result Value Ref Range   WBC 6.9  4.0 - 10.5 K/uL   RBC 4.09  3.87 - 5.11 MIL/uL   Hemoglobin 12.6  12.0 - 15.0 g/dL   HCT 38.3  36.0 - 46.0 %   MCV 93.6  78.0 - 100.0 fL   MCH 30.8  26.0 - 34.0 pg   MCHC 32.9  30.0 - 36.0 g/dL   RDW 13.9  11.5 - 15.5 %   Platelets 195  150 - 400 K/uL  MAGNESIUM     Status: None   Collection Time    02/04/14  8:10 AM      Result Value Ref Range   Magnesium 2.0  1.5 - 2.5 mg/dL    Imaging / Studies: Ct Abdomen Pelvis W Contrast  02/03/2014   CLINICAL DATA:  Did not feel well last night, vomiting today, abdominal pain 10 out of 10, unable to keep fluids down, no bowel movement for 2 days, flu shot 3 days ago, multiple prior abdominal surgeries, personal history of small bowel obstruction, diverticulitis, leiomyosarcoma  EXAM: CT ABDOMEN AND PELVIS WITH CONTRAST  TECHNIQUE: Multidetector CT imaging of the abdomen and pelvis was performed using the standard protocol following bolus administration of intravenous contrast. Sagittal and coronal MPR images reconstructed from axial data set.  CONTRAST:  131m OMNIPAQUE IOHEXOL 300 MG/ML SOLN IV. Dilute oral contrast.  COMPARISON:  08/17/2011  FINDINGS: Dependent  atelectasis at both lung bases.  2.0 x 1.2 cm cyst mid LEFT kidney image 27.  Liver, spleen, atrophic pancreas, kidneys, and adrenal glands otherwise normal.  Scattered atherosclerotic calcifications.  Sigmoid diverticulosis without evidence of diverticulitis.  Dilated proximal and decompressed distal small bowel loops compatible with small bowel obstruction.  Transition point in LEFT pelvis, images 59-62 with small bowel stool sign immediately proximal, best demonstrated on sagittal image 78.  Minimal wall thickening is seen at the transition zone, uncertain if related to scarring, inflammation, less likely tumor.  No adenopathy, free air, or hernia.  Post hysterectomy with nonvisualization of ovaries and appendix.  Unremarkable bladder and ureters.  Scattered degenerative changes of the lumbar spine.  IMPRESSION: Distal small bowel obstruction with transition zone in the LEFT pelvis where a short segment of minimal wall thickening is identified, question adhesion, scarring, inflammation, less likely tumor.  Small amount of free pelvic fluid without evidence of perforation or abscess.  Distal colonic diverticulosis without evidence of diverticulitis.   Electronically Signed   By: Lavonia Dana M.D.   On: 02/03/2014 16:32   Dg Abd 2 Views  02/04/2014   CLINICAL DATA:  Subsequent encounter for small bowel obstruction .  EXAM: ABDOMEN - 2 VIEW  COMPARISON:  CT abdomen and pelvis 02/03/2014.  FINDINGS: Chronic interstitial coarsening is present at the lung bases bilaterally. The heart is mildly enlarged. The side port of the NG tube is just above the GE junction and could be advanced for more optimal positioning.  Supine and upright views the abdomen demonstrate a nonspecific bowel gas pattern. The previously seen obstruction is no longer evident. Surgical clips are present within the anatomic pelvis. Degenerative changes are noted in the lower lumbar spine. Contrast remains within the urinary bladder.  IMPRESSION:  1. Interval decompression of the small bowel without evidence for residual or recurrent obstruction. 2. The side port of the NG tube it is near the level of the GE junction and could be advanced for more optimal positioning. 3. Degenerative changes of the lower lumbar spine. 4. Chronic interstitial coarsening at the lung bases bilaterally.   Electronically Signed   By: Lawrence Santiago M.D.   On: 02/04/2014 08:54    Medications / Allergies: per chart  Antibiotics: Anti-infectives   None       Note: Portions of this report may have been transcribed using voice recognition software. Every effort was made to ensure accuracy; however, inadvertent computerized transcription errors may be present.   Any transcriptional errors that result from this process are unintentional.

## 2014-02-05 DIAGNOSIS — K5909 Other constipation: Secondary | ICD-10-CM

## 2014-02-05 LAB — BASIC METABOLIC PANEL
Anion gap: 10 (ref 5–15)
BUN: 9 mg/dL (ref 6–23)
CO2: 25 mEq/L (ref 19–32)
Calcium: 8.4 mg/dL (ref 8.4–10.5)
Chloride: 104 mEq/L (ref 96–112)
Creatinine, Ser: 0.48 mg/dL — ABNORMAL LOW (ref 0.50–1.10)
GFR calc Af Amer: >90 mL/min (ref >90)
GFR calc non Af Amer: 89 mL/min — ABNORMAL LOW (ref >90)
Glucose, Bld: 83 mg/dL (ref 70–99)
Potassium: 4 mEq/L (ref 3.7–5.3)
Sodium: 139 mEq/L (ref 137–147)

## 2014-02-05 MED ORDER — POLYETHYLENE GLYCOL 3350 17 G PO PACK
17.0000 g | PACK | Freq: Two times a day (BID) | ORAL | Status: DC
  Administered 2014-02-05 – 2014-02-06 (×2): 17 g via ORAL
  Filled 2014-02-05 (×4): qty 1

## 2014-02-05 MED ORDER — SODIUM CHLORIDE 0.9 % IJ SOLN
3.0000 mL | Freq: Two times a day (BID) | INTRAMUSCULAR | Status: DC
  Administered 2014-02-05: 3 mL via INTRAVENOUS

## 2014-02-05 MED ORDER — LACTATED RINGERS IV BOLUS (SEPSIS): 1000.0000 mL | Freq: Three times a day (TID) | INTRAVENOUS | Status: DC | PRN

## 2014-02-05 MED ORDER — SODIUM CHLORIDE 0.9 % IJ SOLN: 3.0000 mL | INTRAMUSCULAR | Status: DC | PRN

## 2014-02-05 MED ORDER — SODIUM CHLORIDE 0.9 % IV SOLN: 250.0000 mL | INTRAVENOUS | Status: DC | PRN

## 2014-02-05 MED ORDER — POLYETHYLENE GLYCOL 3350 17 G PO PACK
17.0000 g | PACK | Freq: Two times a day (BID) | ORAL | Status: DC | PRN
  Filled 2014-02-05: qty 1

## 2014-02-05 MED ORDER — SACCHAROMYCES BOULARDII 250 MG PO CAPS
250.0000 mg | ORAL_CAPSULE | Freq: Two times a day (BID) | ORAL | Status: DC
  Administered 2014-02-05 – 2014-02-06 (×3): 250 mg via ORAL
  Filled 2014-02-05 (×4): qty 1

## 2014-02-05 MED ORDER — BISACODYL 10 MG RE SUPP: 10.0000 mg | Freq: Two times a day (BID) | RECTAL | Status: DC | PRN

## 2014-02-05 NOTE — Progress Notes (Signed)
[facility]  [address] [place], [place] [address] Phone: [phone] FAX: [place] [phone] Jan 08, 1930  CARE TEAM:  PCP: [name], MD  Outpatient Care Team: Patient Care Team: Darlyne Russian, MD as PCP - General (Family Medicine)  Inpatient Treatment Team: Treatment Team: Attending Provider: Eugenie Filler, MD; Consulting Physician: Nolon Nations, MD; Registered Nurse: Vivi Martens, RN; Registered Nurse: Milta Deiters, RN; Rounding Team: Ian Bushman, MD; Registered Nurse: Sonda Rumble, RN   Subjective:  Feels better Wants to see Xrays Wants to home Worried about her husband whom she takes care of - tearful but consolable  Objective:  Vital signs:  Filed Vitals:   02/04/14 0626 02/04/14 1357 02/04/14 2226 02/05/14 0601  BP: 133/58 104/75 123/48 135/55  Pulse: 70 62 55 64  Temp: 97.7 F (36.5 C) 97.8 F (36.6 C) 98 F (36.7 C) 98.2 F (36.8 C)  TempSrc: Oral Oral Oral Oral  Resp: _0 Height:      Weight:      SpO2: 99% 99% 98% 100%    Last BM Date: 02/04/14  Intake/Output   Yesterday:  10/10 0701 - 10/11 0700 In: 120 [P.O.:120] Out: 150 [Urine:150] This shift:     Bowel function:  Flatus: y  BM: n  Drain: thin NGT output  Physical Exam:  General: Pt awake/alert/oriented x4 in no acute distress Eyes: PERRL, normal EOM.  Sclera clear.  No icterus Neuro: CN II-XII intact w/o focal sensory/motor deficits. Lymph: No head/neck/groin lymphadenopathy Psych:  No delerium/psychosis/paranoia.  Tearful at first but laughing later HENT: Normocephalic, Mucus membranes moist.  No thrush Neck: Supple, No tracheal deviation Chest: No chest wall pain w good excursion CV:  Pulses intact.  Regular rhythm MS: Normal AROM mjr joints.  No obvious deformity Abdomen: Soft.  Nondistended.  Nontender.  No evidence of peritonitis.  No incarcerated hernias. Ext:  SCDs BLE.   No mjr edema.  No cyanosis Skin: No petechiae / purpura   Problem List:   Principal Problem:   SBO (small bowel obstruction) Active Problems:   Diverticulosis   B12 deficiency   Constipation   Partial small bowel obstruction   Assessment  Monica Ward  80 y.o. female       PSBO resolving  Plan:  -adv fulls, then maybe [place] - I updated the patient's status to the patient with the RNs.  Recommendations were made.  Questions were answered.  The patient expressed understanding & appreciation. -VTE prophylaxis- SCDs, etc -mobilize as tolerated to help recovery  D/C patient from hospital when patient meets criteria (anticipate later tonight vs AM):  Tolerating oral intake well Ambulating in walkways Adequate pain control without IV medications Urinating  Having flatus   [name], M.D., F.A.C.S. Gastrointestinal and Minimally Invasive Surgery [facility] [address] [place], [place] [address] [phone] Main / Paging   02/05/2014   Results:   Labs: Results for orders placed during the hospital encounter of 02/03/14 (from the past 48 hour(s))  CBC WITH DIFFERENTIAL     Status: Abnormal   Collection Time    02/03/14 12:11 PM      Result Value Ref Range   WBC 10.0  4.0 - 10.5 K/uL   RBC 4.42  3.87 - 5.11 MIL/uL   Hemoglobin 13.8  12.0 - 15.0 g/dL   HCT 40.4  36.0 - 46.0 %  MCV 91.4  78.0 - 100.0 fL   MCH 31.2  26.0 - 34.0 pg   MCHC 34.2  30.0 - 36.0 g/dL   RDW 13.8  11.5 - 15.5 %   Platelets 237  150 - 400 K/uL   Neutrophils Relative % 86 (*) 43 - 77 %   Neutro Abs 8.7 (*) 1.7 - 7.7 K/uL   Lymphocytes Relative 7 (*) 12 - 46 %   Lymphs Abs 0.7  0.7 - 4.0 K/uL   Monocytes Relative 7  3 - 12 %   Monocytes Absolute 0.7  0.1 - 1.0 K/uL   Eosinophils Relative 0  0 - 5 %   Eosinophils Absolute 0.0  0.0 - 0.7 K/uL   Basophils Relative 0  0 - 1 %   Basophils Absolute 0.0  0.0 - 0.1 K/uL  COMPREHENSIVE  METABOLIC PANEL     Status: Abnormal   Collection Time    02/03/14 12:11 PM      Result Value Ref Range   Sodium 140  137 - 147 mEq/L   Potassium 4.0  3.7 - 5.3 mEq/L   Chloride 102  96 - 112 mEq/L   CO2 24  19 - 32 mEq/L   Glucose, Bld 99  70 - 99 mg/dL   BUN 15  6 - 23 mg/dL   Creatinine, Ser 0.56  0.50 - 1.10 mg/dL   Calcium 9.3  8.4 - 10.5 mg/dL   Total Protein 7.0  6.0 - 8.3 g/dL   Albumin 3.7  3.5 - 5.2 g/dL   AST 20  0 - 37 U/L   ALT 11  0 - 35 U/L   Alkaline Phosphatase 58  39 - 117 U/L   Total Bilirubin 0.6  0.3 - 1.2 mg/dL   GFR calc non Af Amer 85 (*) >90 mL/min   GFR calc Af Amer >90  >90 mL/min   Comment: (NOTE)     The eGFR has been calculated using the CKD EPI equation.     This calculation has not been validated in all clinical situations.     eGFR's persistently <90 mL/min signify possible Chronic Kidney     Disease.   Anion gap 14  5 - 15  LIPASE, BLOOD     Status: None   Collection Time    02/03/14 12:11 PM      Result Value Ref Range   Lipase 23  11 - 59 U/L  URINALYSIS, ROUTINE W REFLEX MICROSCOPIC     Status: Abnormal   Collection Time    02/03/14  4:05 PM      Result Value Ref Range   Color, Urine YELLOW  YELLOW   APPearance CLEAR  CLEAR   Specific Gravity, Urine 1.017  1.005 - 1.030   pH 6.0  5.0 - 8.0   Glucose, UA NEGATIVE  NEGATIVE mg/dL   Hgb urine dipstick NEGATIVE  NEGATIVE   Bilirubin Urine NEGATIVE  NEGATIVE   Ketones, ur >80 (*) NEGATIVE mg/dL   Protein, ur NEGATIVE  NEGATIVE mg/dL   Urobilinogen, UA 1.0  0.0 - 1.0 mg/dL   Nitrite NEGATIVE  NEGATIVE   Leukocytes, UA SMALL (*) NEGATIVE  URINE MICROSCOPIC-ADD ON     Status: Abnormal   Collection Time    02/03/14  4:05 PM      Result Value Ref Range   Squamous Epithelial / LPF FEW (*) RARE   WBC, UA 3-6  <3 WBC/hpf   Bacteria, UA RARE  RARE  BASIC METABOLIC PANEL     Status: Abnormal   Collection Time    02/04/14  8:10 AM      Result Value Ref Range   Sodium 140  137 - 147 mEq/L    Potassium 3.7  3.7 - 5.3 mEq/L   Chloride 102  96 - 112 mEq/L   CO2 22  19 - 32 mEq/L   Glucose, Bld 70  70 - 99 mg/dL   BUN 17  6 - 23 mg/dL   Creatinine, Ser 0.59  0.50 - 1.10 mg/dL   Calcium 8.5  8.4 - 10.5 mg/dL   GFR calc non Af Amer 83 (*) >90 mL/min   GFR calc Af Amer >90  >90 mL/min   Comment: (NOTE)     The eGFR has been calculated using the CKD EPI equation.     This calculation has not been validated in all clinical situations.     eGFR's persistently <90 mL/min signify possible Chronic Kidney     Disease.   Anion gap 16 (*) 5 - 15  CBC     Status: None   Collection Time    02/04/14  8:10 AM      Result Value Ref Range   WBC 6.9  4.0 - 10.5 K/uL   RBC 4.09  3.87 - 5.11 MIL/uL   Hemoglobin 12.6  12.0 - 15.0 g/dL   HCT 38.3  36.0 - 46.0 %   MCV 93.6  78.0 - 100.0 fL   MCH 30.8  26.0 - 34.0 pg   MCHC 32.9  30.0 - 36.0 g/dL   RDW 13.9  11.5 - 15.5 %   Platelets 195  150 - 400 K/uL  MAGNESIUM     Status: None   Collection Time    02/04/14  8:10 AM      Result Value Ref Range   Magnesium 2.0  1.5 - 2.5 mg/dL  BASIC METABOLIC PANEL     Status: Abnormal   Collection Time    02/05/14  5:00 AM      Result Value Ref Range   Sodium 139  137 - 147 mEq/L   Potassium 4.0  3.7 - 5.3 mEq/L   Chloride 104  96 - 112 mEq/L   CO2 25  19 - 32 mEq/L   Glucose, Bld 83  70 - 99 mg/dL   BUN 9  6 - 23 mg/dL   Creatinine, Ser 0.48 (*) 0.50 - 1.10 mg/dL   Calcium 8.4  8.4 - 10.5 mg/dL   GFR calc non Af Amer 89 (*) >90 mL/min   GFR calc Af Amer >90  >90 mL/min   Comment: (NOTE)     The eGFR has been calculated using the CKD EPI equation.     This calculation has not been validated in all clinical situations.     eGFR's persistently <90 mL/min signify possible Chronic Kidney     Disease.   Anion gap 10  5 - 15    Imaging / Studies: Ct Abdomen Pelvis W Contrast  02/03/2014   CLINICAL DATA:  Did not feel well last night, vomiting today, abdominal pain 10 out of 10, unable to  keep fluids down, no bowel movement for 2 days, flu shot 3 days ago, multiple prior abdominal surgeries, personal history of small bowel obstruction, diverticulitis, leiomyosarcoma  EXAM: CT ABDOMEN AND PELVIS WITH CONTRAST  TECHNIQUE: Multidetector CT imaging of the abdomen and pelvis was performed using the standard protocol following bolus administration  of intravenous contrast. Sagittal and coronal MPR images reconstructed from axial data set.  CONTRAST:  183m OMNIPAQUE IOHEXOL 300 MG/ML SOLN IV. Dilute oral contrast.  COMPARISON:  08/17/2011  FINDINGS: Dependent atelectasis at both lung bases.  2.0 x 1.2 cm cyst mid LEFT kidney image 27.  Liver, spleen, atrophic pancreas, kidneys, and adrenal glands otherwise normal.  Scattered atherosclerotic calcifications.  Sigmoid diverticulosis without evidence of diverticulitis.  Dilated proximal and decompressed distal small bowel loops compatible with small bowel obstruction.  Transition point in LEFT pelvis, images 59-62 with small bowel stool sign immediately proximal, best demonstrated on sagittal image 78.  Minimal wall thickening is seen at the transition zone, uncertain if related to scarring, inflammation, less likely tumor.  No adenopathy, free air, or hernia.  Post hysterectomy with nonvisualization of ovaries and appendix.  Unremarkable bladder and ureters.  Scattered degenerative changes of the lumbar spine.  IMPRESSION: Distal small bowel obstruction with transition zone in the LEFT pelvis where a short segment of minimal wall thickening is identified, question adhesion, scarring, inflammation, less likely tumor.  Small amount of free pelvic fluid without evidence of perforation or abscess.  Distal colonic diverticulosis without evidence of diverticulitis.   Electronically Signed   By: MLavonia DanaM.D.   On: 02/03/2014 16:32   Dg Abd 2 Views  02/04/2014   CLINICAL DATA:  Subsequent encounter for small bowel obstruction .  EXAM: ABDOMEN - 2 VIEW   COMPARISON:  CT abdomen and pelvis 02/03/2014.  FINDINGS: Chronic interstitial coarsening is present at the lung bases bilaterally. The heart is mildly enlarged. The side port of the NG tube is just above the GE junction and could be advanced for more optimal positioning.  Supine and upright views the abdomen demonstrate a nonspecific bowel gas pattern. The previously seen obstruction is no longer evident. Surgical clips are present within the anatomic pelvis. Degenerative changes are noted in the lower lumbar spine. Contrast remains within the urinary bladder.  IMPRESSION: 1. Interval decompression of the small bowel without evidence for residual or recurrent obstruction. 2. The side port of the NG tube it is near the level of the GE junction and could be advanced for more optimal positioning. 3. Degenerative changes of the lower lumbar spine. 4. Chronic interstitial coarsening at the lung bases bilaterally.   Electronically Signed   By: CLawrence SantiagoM.D.   On: 02/04/2014 08:54    Medications / Allergies: per chart  Antibiotics: Anti-infectives   None       Note: Portions of this report may have been transcribed using voice recognition software. Every effort was made to ensure accuracy; however, inadvertent computerized transcription errors may be present.   Any transcriptional errors that result from this process are unintentional.

## 2014-02-05 NOTE — Progress Notes (Signed)
[facility] PROGRESS NOTE  Monica Ward LYY:[id number] DOB: 31-May-1929 DOA: 02/03/2014 PCP: [name], MD  Assessment/Plan: #1 small bowel obstruction Likely secondary to adhesions. Patient with clinical improvement. No abdominal pain. Patient states passing flatus. KUB shows improvement with small bowel obstruction. NG tube has been discontinued. Patient currently tolerating clear liquids. Patient with multiple loose stools last night per patient. Diet is been advanced as tolerated per general surgery. General surgery following and appreciate input and recommendations.  #2 chronic constipation Will likely need aggressive bowel regimen on discharge.  #3 hyperlipidemia Omega -3 on hold. Follow.  #4 prophylaxis Lovenox for DVT prophylaxis.  Code Status: Full Family Communication: Updated patient no family present. Disposition Plan: Home when small bowel obstruction has resolved and having bowel movements, and tolerating oral intake hopefully tomorrow.   Consultants:  General surgery: Dr. Lucia Gaskins 02/03/2014  Procedures:  CT abdomen and pelvis 02/03/2014  KUB 02/04/2014  Antibiotics:  None  HPI/Subjective: Patient states she's feeling better. No abdominal pain. No nausea. No vomiting. Patient states he is passing gas. Patient states had multiple loose stools. Patient tolerating clear liquids.  Objective: Filed Vitals:   02/05/14 1343  BP: 119/67  Pulse: 60  Temp: 98.7 F (37.1 C)  Resp: 16    Intake/Output Summary (Last 24 hours) at 02/05/14 1736 Last data filed at 02/04/14 1826  Gross per 24 hour  Intake    120 ml  Output    150 ml  Net    -30 ml   Filed Weights   02/03/14 1900  Weight: 54.114 kg (119 lb 4.8 oz)    Exam:   General:  NAD.   Cardiovascular: RRR  Respiratory: CTAB  Abdomen: Soft, nontender, nondistended, positive bowel sounds.  Musculoskeletal: No clubbing cyanosis or edema.  Data Reviewed: Basic Metabolic  Panel:  Recent Labs Lab 02/03/14 1211 02/04/14 0810 02/05/14 0500  NA 140 140 139  K 4.0 3.7 4.0  CL 102 102 104  CO2 24 22 25   GLUCOSE 99 70 83  BUN 15 17 9   CREATININE 0.56 0.59 0.48*  CALCIUM 9.3 8.5 8.4  MG  --  2.0  --    Liver Function Tests:  Recent Labs Lab 02/03/14 1211  AST 20  ALT 11  ALKPHOS 58  BILITOT 0.6  PROT 7.0  ALBUMIN 3.7    Recent Labs Lab 02/03/14 1211  LIPASE 23   No results found for this basename: AMMONIA,  in the last 168 hours CBC:  Recent Labs Lab 02/03/14 1211 02/04/14 0810  WBC 10.0 6.9  NEUTROABS 8.7*  --   HGB 13.8 12.6  HCT 40.4 38.3  MCV 91.4 93.6  PLT 237 195   Cardiac Enzymes: No results found for this basename: CKTOTAL, CKMB, CKMBINDEX, TROPONINI,  in the last 168 hours BNP (last 3 results) No results found for this basename: PROBNP,  in the last 8760 hours CBG: No results found for this basename: GLUCAP,  in the last 168 hours  No results found for this or any previous visit (from the past 240 hour(s)).   Studies: Dg Abd 2 Views  02/04/2014   CLINICAL DATA:  Subsequent encounter for small bowel obstruction .  EXAM: ABDOMEN - 2 VIEW  COMPARISON:  CT abdomen and pelvis 02/03/2014.  FINDINGS: Chronic interstitial coarsening is present at the lung bases bilaterally. The heart is mildly enlarged. The side port of the NG tube is just above the GE junction and could be advanced for more optimal  positioning.  Supine and upright views the abdomen demonstrate a nonspecific bowel gas pattern. The previously seen obstruction is no longer evident. Surgical clips are present within the anatomic pelvis. Degenerative changes are noted in the lower lumbar spine. Contrast remains within the urinary bladder.  IMPRESSION: 1. Interval decompression of the small bowel without evidence for residual or recurrent obstruction. 2. The side port of the NG tube it is near the level of the GE junction and could be advanced for more optimal  positioning. 3. Degenerative changes of the lower lumbar spine. 4. Chronic interstitial coarsening at the lung bases bilaterally.   Electronically Signed   By: Lawrence Santiago M.D.   On: 02/04/2014 08:54    Scheduled Meds: . enoxaparin (LOVENOX) injection  40 mg Subcutaneous Q24H  . lip balm  1 application Topical BID  . polyethylene glycol  17 g Oral BID  . saccharomyces boulardii  250 mg Oral BID  . sodium chloride  3 mL Intravenous Q12H   Continuous Infusions:    Principal Problem:   SBO (small bowel obstruction) Active Problems:   Diverticulosis   B12 deficiency   Constipation   Partial small bowel obstruction    Time spent: 80 mins    [facility] MD [facility] Pager [phone]. If 7PM-7AM, please contact night-coverage at [email or web address], password [facility] 02/05/2014, 5:36 PM  LOS: 2 days

## 2014-02-05 NOTE — Discharge Instructions (Signed)
Small Bowel Obstruction A small bowel obstruction is a blockage (obstruction) of the small intestine (small bowel). The small bowel is a long, slender tube that connects the stomach to the colon. Its job is to absorb nutrients from the fluids and foods you consume into the bloodstream.  CAUSES  There are many causes of intestinal blockage. The most common ones include:  Hernias. This is a more common cause in children than adults.  Inflammatory bowel disease (enteritis and colitis).  Twisting of the bowel (volvulus).  Tumors.  Scar tissue (adhesions) from previous surgery or radiation treatment.  Recent surgery. This may cause an acute small bowel obstruction called an ileus. SYMPTOMS   Abdominal pain. This may be dull cramps or sharp pain. It may occur in one area or may be present in the entire abdomen. Pain can range from mild to severe, depending on the degree of obstruction.  Nausea and vomiting. Vomit may be greenish or yellow bile color.  Distended or swollen stomach. Abdominal bloating is a common symptom.  Constipation.  Lack of passing gas.  Frequent belching.  Diarrhea. This may occur if runny stool is able to leak around the obstruction. DIAGNOSIS  Your caregiver can usually diagnose small bowel obstruction by taking a history, doing a physical exam, and taking X-rays. If the cause is unclear, a CT scan (computerized tomography) of your abdomen and pelvis may be needed. TREATMENT  Treatment of the blockage depends on the cause and how bad the problem is.   Sometimes, the obstruction improves with bed rest and intravenous (IV) fluids.  Resting the bowel is very important. This means following a simple diet. Sometimes, a clear liquid diet may be required for several days.  Sometimes, a small tube (nasogastric tube) is placed into the stomach to decompress the bowel. When the bowel is blocked, it usually swells up like a balloon filled with air and fluids.  Decompression means that the air and fluids are removed by suction through that tube. This can help with pain, discomfort, and nausea. It can also help the obstruction resolve faster.  Surgery may be required if other treatments do not work. Bowel obstruction from a hernia may require early surgery and can be an emergency procedure. Adhesions that cause frequent or severe obstructions may also require surgery. HOME CARE INSTRUCTIONS If your bowel obstruction is only partial or incomplete, you may be allowed to go home.  Get plenty of rest.  Follow your diet as directed by your caregiver.  Only consume clear liquids until your condition improves.  Avoid solid foods as instructed. SEEK IMMEDIATE MEDICAL CARE IF:  You have increased pain or cramping.  You vomit blood.  You have uncontrolled vomiting or nausea.  You cannot drink fluids due to vomiting or pain.  You develop confusion.  You begin feeling very dry or thirsty (dehydrated).  You have severe bloating.  You have chills.  You have a fever.  You feel extremely weak or you faint. MAKE SURE YOU:  Understand these instructions.  Will watch your condition.  Will get help right away if you are not doing well or get worse. Document Released: 07/01/2005 Document Revised: 07/07/2011 Document Reviewed: 06/28/2010 [facility] Patient Information 2015 [place], [place]. This information is not intended to replace advice given to you by your health care provider. Make sure you discuss any questions you have with your health care provider.  GETTING TO GOOD BOWEL HEALTH. Irregular bowel habits such as constipation and diarrhea can lead to many  problems over time.  Having one soft bowel movement a day is the most important way to prevent further problems.  The anorectal canal is designed to handle stretching and feces to safely manage our ability to get rid of solid waste (feces, poop, stool) out of our body.  BUT, hard constipated  stools can act like ripping concrete bricks and diarrhea can be a burning fire to this very sensitive area of our body, causing inflamed hemorrhoids, anal fissures, increasing risk is perirectal abscesses, abdominal pain/bloating, an making irritable bowel worse.     The goal: ONE SOFT BOWEL MOVEMENT A DAY!  To have soft, regular bowel movements:    Drink at least 8 tall glasses of water a day.     Take plenty of fiber.  Fiber is the undigested part of plant food that passes into the colon, acting s natures broom to encourage bowel motility and movement.  Fiber can absorb and hold large amounts of water. This results in a larger, bulkier stool, which is soft and easier to pass. Work gradually over several weeks up to 6 servings a day of fiber (25g a day even more if needed) in the form of: o Vegetables -- Root (potatoes, carrots, turnips), leafy green (lettuce, salad greens, celery, spinach), or cooked high residue (cabbage, broccoli, etc) o Fruit -- Fresh (unpeeled skin & pulp), Dried (prunes, apricots, cherries, etc ),  or stewed ( applesauce)  o Whole grain breads, pasta, etc (whole wheat)  o Bran cereals    Bulking Agents -- This type of water-retaining fiber generally is easily obtained each day by one of the following:  o Psyllium bran -- The psyllium plant is remarkable because its ground seeds can retain so much water. This product is available as Metamucil, Konsyl, Effersyllium, Per Diem Fiber, or the less expensive generic preparation in drug and health food stores. Although labeled a laxative, it really is not a laxative.  o Methylcellulose -- This is another fiber derived from wood which also retains water. It is available as Citrucel. o Polyethylene Glycol - and artificial fiber commonly called Miralax or Glycolax.  It is helpful for people with gassy or bloated feelings with regular fiber o Flax Seed - a less gassy fiber than psyllium   No reading or other relaxing activity while on  the toilet. If bowel movements take longer than 5 minutes, you are too constipated   AVOID CONSTIPATION.  High fiber and water intake usually takes care of this.  Sometimes a laxative is needed to stimulate more frequent bowel movements, but    Laxatives are not a good long-term solution as it can wear the colon out. o Osmotics (Milk of Magnesia, Fleets phosphosoda, Magnesium citrate, MiraLax, GoLytely) are safer than  o Stimulants (Senokot, Castor Oil, Dulcolax, Ex Lax)    o Do not take laxatives for more than 7days in a row.    IF SEVERELY CONSTIPATED, try a Bowel Retraining Program: o Do not use laxatives.  o Eat a diet high in roughage, such as bran cereals and leafy vegetables.  o Drink six (6) ounces of prune or apricot juice each morning.  o Eat two (2) large servings of stewed fruit each day.  o Take one (1) heaping tablespoon of a psyllium-based bulking agent twice a day. Use sugar-free sweetener when possible to avoid excessive calories.  o Eat a normal breakfast.  o Set aside 15 minutes after breakfast to sit on the toilet, but do not strain to  have a bowel movement.  o If you do not have a bowel movement by the third day, use an enema and repeat the above steps.    Controlling diarrhea o Switch to liquids and simpler foods for a few days to avoid stressing your intestines further. o Avoid dairy products (especially milk & ice cream) for a short time.  The intestines often can lose the ability to digest lactose when stressed. o Avoid foods that cause gassiness or bloating.  Typical foods include beans and other legumes, cabbage, broccoli, and dairy foods.  Every person has some sensitivity to other foods, so listen to our body and avoid those foods that trigger problems for you. o Adding fiber (Citrucel, Metamucil, psyllium, Miralax) gradually can help thicken stools by absorbing excess fluid and retrain the intestines to act more normally.  Slowly increase the dose over a few weeks.   Too much fiber too soon can backfire and cause cramping & bloating. o Probiotics (such as active yogurt, Align, etc) may help repopulate the intestines and colon with normal bacteria and calm down a sensitive digestive tract.  Most studies show it to be of mild help, though, and such products can be costly. o Medicines:   Bismuth subsalicylate (ex. Kayopectate, Pepto Bismol) every 30 minutes for up to 6 doses can help control diarrhea.  Avoid if pregnant.   Loperamide (Immodium) can slow down diarrhea.  Start with two tablets (62m total) first and then try one tablet every 6 hours.  Avoid if you are having fevers or severe pain.  If you are not better or start feeling worse, stop all medicines and call your doctor for advice o Call your doctor if you are getting worse or not better.  Sometimes further testing (cultures, endoscopy, X-ray studies, bloodwork, etc) may be needed to help diagnose and treat the cause of the diarrhea.  Exercise to Stay Healthy Exercise helps you become and stay healthy. EXERCISE IDEAS AND TIPS Choose exercises that:  You enjoy.  Fit into your day. You do not need to exercise really hard to be healthy. You can do exercises at a slow or medium level and stay healthy. You can:  Stretch before and after working out.  Try yoga, Pilates, or tai chi.  Lift weights.  Walk fast, swim, jog, run, climb stairs, bicycle, dance, or rollerskate.  Take aerobic classes. Exercises that burn about 150 calories:  Running 1  miles in 15 minutes.  Playing volleyball for 45 to 60 minutes.  Washing and waxing a car for 45 to 60 minutes.  Playing touch football for 45 minutes.  Walking 1  miles in 35 minutes.  Pushing a stroller 1  miles in 30 minutes.  Playing basketball for 30 minutes.  Raking leaves for 30 minutes.  Bicycling 5 miles in 30 minutes.  Walking 2 miles in 30 minutes.  Dancing for 30 minutes.  Shoveling snow for 15 minutes.  Swimming laps for 20  minutes.  Walking up stairs for 15 minutes.  Bicycling 4 miles in 15 minutes.  Gardening for 30 to 45 minutes.  Jumping rope for 15 minutes.  Washing windows or floors for 45 to 60 minutes. Document Released: 05/17/2010 Document Revised: 07/07/2011 Document Reviewed: 05/17/2010 [facility] Information 2015 [place] This information is not intended to replace advice given to you by your health care provider. Make sure you discuss any questions you have with your health care provider.

## 2014-02-05 NOTE — Progress Notes (Signed)
Ms Nishi was up several time during the night with some episode of loose stools. Pt had 3-4 loose stool during the night.

## 2014-02-06 ENCOUNTER — Encounter (HOSPITAL_COMMUNITY): Payer: Self-pay | Admitting: Surgery

## 2014-02-06 DIAGNOSIS — K5909 Other constipation: Secondary | ICD-10-CM

## 2014-02-06 DIAGNOSIS — K5730 Diverticulosis of large intestine without perforation or abscess without bleeding: Secondary | ICD-10-CM

## 2014-02-06 HISTORY — DX: Other constipation: K59.09

## 2014-02-06 MED ORDER — POLYETHYLENE GLYCOL 3350 17 G PO PACK
17.0000 g | PACK | Freq: Two times a day (BID) | ORAL | Status: DC
Start: 2014-02-06 — End: 2014-12-27

## 2014-02-06 MED ORDER — PSYLLIUM 28 % PO PACK: 1.0000 | PACK | Freq: Two times a day (BID) | ORAL | Status: DC

## 2014-02-06 MED ORDER — SENNOSIDES-DOCUSATE SODIUM 8.6-50 MG PO TABS: 1.0000 | ORAL_TABLET | Freq: Every day | ORAL | Status: DC

## 2014-02-06 NOTE — Progress Notes (Signed)
[address] [place], [place] [address] Phone: [phone] FAX: [phone]    Monica Ward [id number] Dec 05, 1929  CARE TEAM:  PCP: [name], MD  Outpatient Care Team: Patient Care Team: Darlyne Russian, MD as PCP - General (Family Medicine)  Inpatient Treatment Team: Treatment Team: Attending Provider: Eugenie Filler, MD; Consulting Physician: Nolon Nations, MD; Registered Nurse: Vivi Martens, RN; Registered Nurse: Milta Deiters, RN; Rounding Team: Ian Bushman, MD; Registered Nurse: Sonda Rumble, RN   Subjective:  Feels much better Tolerating solids well Mild diarrhea resolving Wants to home  Objective:  Vital signs:  Filed Vitals:   02/05/14 0601 02/05/14 1343 02/05/14 2055 02/06/14 0504  BP: 135/55 119/67 132/55 142/57  Pulse: 64 60 63 52  Temp: 98.2 F (36.8 C) 98.7 F (37.1 C) 98.1 F (36.7 C) 98.2 F (36.8 C)  TempSrc: Oral Oral Oral Oral  Resp: 15 16 16 16   Height:      Weight:      SpO2: 100% 98% 96% 95%    Last BM Date: 02/05/14  Intake/Output   Yesterday:    This shift:     Bowel function:  Flatus: y  BM: Yes  Drain: N/A  Physical Exam:  General: Pt awake/alert/oriented x4 in no acute distress Eyes: PERRL, normal EOM.  Sclera clear.  No icterus Neuro: CN II-XII intact w/o focal sensory/motor deficits. Lymph: No head/neck/groin lymphadenopathy Psych:  No delerium/psychosis/paranoia.  Smiling HENT: Normocephalic, Mucus membranes moist.  No thrush Neck: Supple, No tracheal deviation Chest: No chest wall pain w good excursion CV:  Pulses intact.  Regular rhythm MS: Normal AROM mjr joints.  No obvious deformity Abdomen: Soft.  Nondistended.  Nontender.  No evidence of peritonitis.  No incarcerated hernias. Ext:  SCDs BLE.  No mjr edema.  No cyanosis Skin: No petechiae / purpura   Problem List:   Principal Problem:   Partial small bowel  obstruction Active Problems:   Diverticulosis   B12 deficiency   Assessment  Monica Ward  80 y.o. female       PSBO resolved  Plan:  OK to d/c home  Rec bowel regimen - try flax seed or Miralax given hyperflatulence w Metamucil  VTE prophylaxis- SCDs, etc  Mobilize as tolerated to help recovery  D/C patient from hospital since: Tolerating oral intake well Ambulating in walkways Adequate pain control without IV medications Urinating  Having flatus   [name], M.D., F.A.C.S. Gastrointestinal and Minimally Invasive [facility] [address] [place], [place] [address] [phone] Main / Paging   02/06/2014   Results:   Labs: Results for orders placed during the hospital encounter of 02/03/14 (from the past 48 hour(s))  BASIC METABOLIC PANEL     Status: Abnormal   Collection Time    02/05/14  5:00 AM      Result Value Ref Range   Sodium 139  137 - 147 mEq/L   Potassium 4.0  3.7 - 5.3 mEq/L   Chloride 104  96 - 112 mEq/L   CO2 25  19 - 32 mEq/L   Glucose, Bld 83  70 - 99 mg/dL   BUN 9  6 - 23 mg/dL   Creatinine, Ser 0.48 (*) 0.50 - 1.10 mg/dL   Calcium 8.4  8.4 - 10.5 mg/dL   GFR calc non Af Amer 89 (*) >90 mL/min   GFR calc Af Amer >  90  >90 mL/min   Comment: (NOTE)     The eGFR has been calculated using the CKD EPI equation.     This calculation has not been validated in all clinical situations.     eGFR's persistently <90 mL/min signify possible Chronic Kidney     Disease.   Anion gap 10  5 - 15    Imaging / Studies: Dg Abd 2 Views  02/04/2014   CLINICAL DATA:  Subsequent encounter for small bowel obstruction .  EXAM: ABDOMEN - 2 VIEW  COMPARISON:  CT abdomen and pelvis 02/03/2014.  FINDINGS: Chronic interstitial coarsening is present at the lung bases bilaterally. The heart is mildly enlarged. The side port of the NG tube is just above the GE junction and could be advanced for more optimal  positioning.  Supine and upright views the abdomen demonstrate a nonspecific bowel gas pattern. The previously seen obstruction is no longer evident. Surgical clips are present within the anatomic pelvis. Degenerative changes are noted in the lower lumbar spine. Contrast remains within the urinary bladder.  IMPRESSION: 1. Interval decompression of the small bowel without evidence for residual or recurrent obstruction. 2. The side port of the NG tube it is near the level of the GE junction and could be advanced for more optimal positioning. 3. Degenerative changes of the lower lumbar spine. 4. Chronic interstitial coarsening at the lung bases bilaterally.   Electronically Signed   By: Lawrence Santiago M.D.   On: 02/04/2014 08:54    Medications / Allergies: per chart  Antibiotics: Anti-infectives   None       Note: Portions of this report may have been transcribed using voice recognition software. Every effort was made to ensure accuracy; however, inadvertent computerized transcription errors may be present.   Any transcriptional errors that result from this process are unintentional.

## 2014-02-06 NOTE — Discharge Summary (Signed)
Physician Discharge Summary  Monica Ward:[id number] DOB: [birth year] DOA: 02/03/2014  PCP: [name], MD  Admit date: 02/03/2014 Discharge date: 02/06/2014  Time spent: 65 minutes  Recommendations for Outpatient Follow-up:  1. Followup with DAUB, STEVE A, MD in 1 week. On followup patient will need a basic metabolic profile done to followup on electrolytes and renal function. Patient's chronic constipation will need to be reassessed at that time.  Discharge Diagnoses:  Principal Problem:   Partial small bowel obstruction Active Problems:   Diverticulosis   B12 deficiency   Constipation, chronic   Discharge Condition: Stable and improved  Diet recommendation: Regular  Filed Weights   02/03/14 1900  Weight: 54.114 kg (119 lb 4.8 oz)    History of present illness:  80 year old female with history of multiple bowel surgeries, small bowel obstruction in 2011, chronic constipation, colonic diverticulosis who presented to the ED with Diffuse abdominal pain since the night prior to admission.  Patient reported acute onset of left lower quadrant abdominal pain while watching TV the night prior to admission with associated severe nausea but no vomiting. Patient put her fingers inside her mouth to vomit but did not get anything out. She then had generalized abdominal pain which was dull aching pain without any aggravating or relieving factors. She reported the symptoms to be similar to when she had small bowel obstruction few years back. Patient reported chronic constipation and had a small bowel movement 2 days prior to admission. She denied any fever, chills, headache, blurred vision, dizziness, chest pain, palpitations, shortness of breath, urinary symptoms or diarrhea. Denied any blood in stool. She denied any recent illness or change in her medications. She was not on any narcotics. Denied recent change in weight or appetite  In the ED patient's vitals were stable except for mild  bradycardia. Blood work done showed WBC of 10, hemoglobin of 13.8 and normal chemistry. CT scan of the abdomen and pelvis showed distal small bowel obstruction with transition zone in the left pelvis likely due to adhesions versus scarring versus inflammation. Also showed a small amount of free pelvic fluid without perforation or abscess. Distal colonic diverticulosis without diverticulitis was noted.  Patient was given 4 mg IV morphine and 4 mg IV Zofran after which her pain improved but still had nausea. Patient seen by general surgery and recommended admission on observation to medicine with no need for acute surgical intervention.  On admitting physician's evaluation patient reported abdominal pain 5/10 in intensity with nausea. NG tube placed in the ED.   Hospital Course:  #1 small bowel obstruction  Patient was admitted with diffuse abdominal pain and left lower quadrant pain while watching television the night prior to admission with severe nausea but no emesis. Patient presented to the ED CT of the abdomen and pelvis which was done showed a distal small bowel obstruction with transition zone in the left pelvis felt likely due to adhesions versus scar versus inflammation. General surgical consultation was obtained and patient was followed throughout the hospitalization by general surgery. Patient was placed on bowel rest. IV fluids. Antiemetics. Pain management. It was felt patient's small bowel obstruction was likely secondary to adhesions. An NG tube was placed on admission. Patient improved clinically and serial x-rays showed improvement of small bowel obstruction. NG tube was clamped and subsequently discontinued. Patient was started on clear liquids which she tolerated. Patient started passing flatus. Patient started having bowel movements. Patient's diet was advanced this was tolerating a solid diet  by day of discharge. Patient be discharged home in stable and improved condition on a aggressive  bowel regimen. Patient will followup with PCP as outpatient.  #2 chronic constipation  Patient noted to have a history of chronic constipation. Patient be discharged on a bowel regimen of MiraLAX twice daily, Senokot-S at bedtime and Metamucil twice daily. Outpatient followup with PCP.  #3 hyperlipidemia  Omega -3 was held during the hospitalization. Resume on discharge.      Procedures: CT abdomen and pelvis 02/03/2014  KUB 02/04/2014     Consultations: General surgery: Dr. Lucia Gaskins 02/03/2014   Discharge Exam: Filed Vitals:   02/06/14 0504  BP: 142/57  Pulse: 52  Temp: 98.2 F (36.8 C)  Resp: 16    General: NAD Cardiovascular: RRR Respiratory: CTAB  Discharge Instructions You were cared for by a hospitalist during your hospital stay. If you have any questions about your discharge medications or the care you received while you were in the hospital after you are discharged, you can call the unit and asked to speak with the hospitalist on call if the hospitalist that took care of you is not available. Once you are discharged, your primary care physician will handle any further medical issues. Please note that NO REFILLS for any discharge medications will be authorized once you are discharged, as it is imperative that you return to your primary care physician (or establish a relationship with a primary care physician if you do not have one) for your aftercare needs so that they can reassess your need for medications and monitor your lab values.  Discharge Instructions   Call MD for:  extreme fatigue    Complete by:  As directed      Call MD for:  hives    Complete by:  As directed      Call MD for:  persistant nausea and vomiting    Complete by:  As directed      Call MD for:  redness, tenderness, or signs of infection (pain, swelling, redness, odor or green/yellow discharge around incision site)    Complete by:  As directed      Call MD for:  severe uncontrolled pain     Complete by:  As directed      Call MD for:    Complete by:  As directed   Temperature > 101.43F     Diet - low sodium heart healthy    Complete by:  As directed      Diet general    Complete by:  As directed      Discharge instructions    Complete by:  As directed   Please see discharge instruction sheets.  Also refer to handout given an office.  Please call our office if you have any questions or concerns [phone]     Discharge instructions    Complete by:  As directed   Follow up with DAUB, STEVE A, MD in 1 week.     Discharge wound care:    Complete by:  As directed   If you have closed incisions, shower and bathe over these incisions with soap and water every day.  Remove all surgical dressings on postoperative day #3.  You do not need to replace dressings over the closed incisions unless you feel more comfortable with a Band-Aid covering it.   If you have an open wound that requires packing, please see wound care instructions.  In general, remove all dressings, wash wound with soap and  water and then replace with saline moistened gauze.  Do the dressing change at least every day.  Please call our office [phone] if you have further questions.     Driving Restrictions    Complete by:  As directed   No driving until off narcotics and can safely swerve away without pain during an emergency     Increase activity slowly    Complete by:  As directed   Walk an hour a day.  Use 20-30 minute walks.  When you can walk 30 minutes without difficulty, increase to low impact/moderate activities such as biking, jogging, swimming, sexual activity..  Eventually can increase to unrestricted activity when not feeling pain.  If you feel pain: STOP!Marland Kitchen   Let pain protect you from overdoing it.  Use ice/heat/over-the-counter pain medications to help minimize his soreness.  Use pain prescriptions as needed to remain active.  It is better to take extra pain medications and be more active than to stay  bedridden to avoid all pain medications.     Increase activity slowly    Complete by:  As directed      Lifting restrictions    Complete by:  As directed   Avoid heavy lifting initially.  Do not push through pain.  You have no specific weight limit.  Coughing and sneezing or four more stressful to your incision than any lifting you will do. Pain will protect you from injury.  Therefore, avoid intense activity until off all narcotic pain medications.  Coughing and sneezing or four more stressful to your incision than any lifting he will do.     May shower / Bathe    Complete by:  As directed      May walk up steps    Complete by:  As directed      Sexual Activity Restrictions    Complete by:  As directed   Sexual activity as tolerated.  Do not push through pain.  Pain will protect you from injury.     Walk with assistance    Complete by:  As directed   Walk over an hour a day.  May use a walker/cane/companion to help with balance and stamina.          Current Discharge Medication List    START taking these medications   Details  polyethylene glycol (MIRALAX / GLYCOLAX) packet Take 17 g by mouth 2 (two) times daily. Qty: 14 each, Refills: 0    psyllium (METAMUCIL SMOOTH TEXTURE) 28 % packet Take 1 packet by mouth 2 (two) times daily.    senna-docusate (SENOKOT S) 8.6-50 MG per tablet Take 1 tablet by mouth at bedtime.      CONTINUE these medications which have NOT CHANGED   Details  acetaminophen (TYLENOL) 325 MG tablet Take 325 mg by mouth every 6 (six) hours as needed for moderate pain.    aspirin 81 MG chewable tablet Chew 81 mg by mouth once.     Cyanocobalamin (B-12 PO) Take 1 tablet by mouth daily.    loperamide (IMODIUM) 2 MG capsule Take 1 capsule (2 mg total) by mouth 4 (four) times daily as needed for diarrhea or loose stools. Qty: 12 capsule, Refills: 0    Multiple Vitamin (MULITIVITAMIN WITH MINERALS) TABS Take 1 tablet by mouth daily.    Multiple  Vitamins-Minerals (ZINC PO) Take 1 tablet by mouth daily.    Omega-3 Fatty Acids (OMEGA 3 PO) Take 1 capsule by mouth daily.    Simethicone (GAS-X  PO) Take 1 tablet by mouth once as needed. As needed for gas.    VITAMIN E PO Take 1 tablet by mouth daily.       No Known Allergies Follow-up Information   Follow up with DAUB, STEVE A, MD. Schedule an appointment as soon as possible for a visit in 1 week.   Specialty:  Family Medicine   Contact information:   [place] [address] [phone]        The results of significant diagnostics from this hospitalization (including imaging, microbiology, ancillary and laboratory) are listed below for reference.    Significant Diagnostic Studies: Ct Abdomen Pelvis W Contrast  02/03/2014   CLINICAL DATA:  Did not feel well last night, vomiting today, abdominal pain 10 out of 10, unable to keep fluids down, no bowel movement for 2 days, flu shot 3 days ago, multiple prior abdominal surgeries, personal history of small bowel obstruction, diverticulitis, leiomyosarcoma  EXAM: CT ABDOMEN AND PELVIS WITH CONTRAST  TECHNIQUE: Multidetector CT imaging of the abdomen and pelvis was performed using the standard protocol following bolus administration of intravenous contrast. Sagittal and coronal MPR images reconstructed from axial data set.  CONTRAST:  149mL OMNIPAQUE IOHEXOL 300 MG/ML SOLN IV. Dilute oral contrast.  COMPARISON:  08/17/2011  FINDINGS: Dependent atelectasis at both lung bases.  2.0 x 1.2 cm cyst mid LEFT kidney image 27.  Liver, spleen, atrophic pancreas, kidneys, and adrenal glands otherwise normal.  Scattered atherosclerotic calcifications.  Sigmoid diverticulosis without evidence of diverticulitis.  Dilated proximal and decompressed distal small bowel loops compatible with small bowel obstruction.  Transition point in LEFT pelvis, images 59-62 with small bowel stool sign immediately proximal, best demonstrated on sagittal image  78.  Minimal wall thickening is seen at the transition zone, uncertain if related to scarring, inflammation, less likely tumor.  No adenopathy, free air, or hernia.  Post hysterectomy with nonvisualization of ovaries and appendix.  Unremarkable bladder and ureters.  Scattered degenerative changes of the lumbar spine.  IMPRESSION: Distal small bowel obstruction with transition zone in the LEFT pelvis where a short segment of minimal wall thickening is identified, question adhesion, scarring, inflammation, less likely tumor.  Small amount of free pelvic fluid without evidence of perforation or abscess.  Distal colonic diverticulosis without evidence of diverticulitis.   Electronically Signed   By: Lavonia Dana M.D.   On: 02/03/2014 16:32   Dg Abd 2 Views  02/04/2014   CLINICAL DATA:  Subsequent encounter for small bowel obstruction .  EXAM: ABDOMEN - 2 VIEW  COMPARISON:  CT abdomen and pelvis 02/03/2014.  FINDINGS: Chronic interstitial coarsening is present at the lung bases bilaterally. The heart is mildly enlarged. The side port of the NG tube is just above the GE junction and could be advanced for more optimal positioning.  Supine and upright views the abdomen demonstrate a nonspecific bowel gas pattern. The previously seen obstruction is no longer evident. Surgical clips are present within the anatomic pelvis. Degenerative changes are noted in the lower lumbar spine. Contrast remains within the urinary bladder.  IMPRESSION: 1. Interval decompression of the small bowel without evidence for residual or recurrent obstruction. 2. The side port of the NG tube it is near the level of the GE junction and could be advanced for more optimal positioning. 3. Degenerative changes of the lower lumbar spine. 4. Chronic interstitial coarsening at the lung bases bilaterally.   Electronically Signed   By: Lawrence Santiago M.D.   On:  02/04/2014 08:54    Microbiology: No results found for this or any previous visit (from the  past 240 hour(s)).   Labs: Basic Metabolic Panel:  Recent Labs Lab 02/03/14 1211 02/04/14 0810 02/05/14 0500  NA 140 140 139  K 4.0 3.7 4.0  CL 102 102 104  CO2 24 22 25   GLUCOSE 99 70 83  BUN 15 17 9   CREATININE 0.56 0.59 0.48*  CALCIUM 9.3 8.5 8.4  MG  --  2.0  --    Liver Function Tests:  Recent Labs Lab 02/03/14 1211  AST 20  ALT 11  ALKPHOS 58  BILITOT 0.6  PROT 7.0  ALBUMIN 3.7    Recent Labs Lab 02/03/14 1211  LIPASE 23   No results found for this basename: AMMONIA,  in the last 168 hours CBC:  Recent Labs Lab 02/03/14 1211 02/04/14 0810  WBC 10.0 6.9  NEUTROABS 8.7*  --   HGB 13.8 12.6  HCT 40.4 38.3  MCV 91.4 93.6  PLT 237 195   Cardiac Enzymes: No results found for this basename: CKTOTAL, CKMB, CKMBINDEX, TROPONINI,  in the last 168 hours BNP: BNP (last 3 results) No results found for this basename: PROBNP,  in the last 8760 hours CBG: No results found for this basename: GLUCAP,  in the last 168 hours     Signed:  [facility] MD [facility] 02/06/2014, 1:53 PM

## 2014-02-06 NOTE — Progress Notes (Signed)
Discharge instructions and medications reviewed with patient. Patient verbalizes understanding and has no questions at this time. Patient confirms she has all personal belongings in her possession at this time. Patient discharged home. 

## 2014-02-10 NOTE — ED Provider Notes (Signed)
Medical screening examination/treatment/procedure(s) were conducted as a shared visit with non-physician practitioner(s) and myself.  I personally evaluated the patient during the encounter.   EKG Interpretation None      Pt seen and examined. Complains of abdominal pain.  Benign abdomen.  Pt reports nausea.  Please see my additional notes.  Tanna Furry, MD 02/10/14 [phone]

## 2014-02-11 NOTE — ED Provider Notes (Signed)
Medical screening examination/treatment/procedure(s) were performed by non-physician practitioner and as supervising physician I was immediately available for consultation/collaboration.   EKG Interpretation None        Tanna Furry, MD 02/11/14 [phone]

## 2014-09-16 ENCOUNTER — Telehealth: Payer: Self-pay | Admitting: Family Medicine

## 2014-09-16 NOTE — Telephone Encounter (Signed)
lmom to call and reschedule her appt that was on 11/23/14

## 2014-11-23 ENCOUNTER — Encounter: Payer: Self-pay | Admitting: Physician Assistant

## 2014-11-23 ENCOUNTER — Encounter: Payer: Medicare Other | Admitting: Emergency Medicine

## 2014-12-27 ENCOUNTER — Ambulatory Visit (INDEPENDENT_AMBULATORY_CARE_PROVIDER_SITE_OTHER): Payer: Medicare Other | Admitting: Family Medicine

## 2014-12-27 ENCOUNTER — Encounter: Payer: Self-pay | Admitting: Family Medicine

## 2014-12-27 VITALS — BP 134/72 | HR 58 | Temp 97.2°F | Resp 16 | Ht 67.0 in | Wt 116.0 lb

## 2014-12-27 DIAGNOSIS — Z0000 Encounter for general adult medical examination without abnormal findings: Secondary | ICD-10-CM | POA: Diagnosis not present

## 2014-12-27 DIAGNOSIS — E538 Deficiency of other specified B group vitamins: Secondary | ICD-10-CM | POA: Diagnosis not present

## 2014-12-27 DIAGNOSIS — Z131 Encounter for screening for diabetes mellitus: Secondary | ICD-10-CM | POA: Diagnosis not present

## 2014-12-27 DIAGNOSIS — N3941 Urge incontinence: Secondary | ICD-10-CM | POA: Diagnosis not present

## 2014-12-27 DIAGNOSIS — K5900 Constipation, unspecified: Secondary | ICD-10-CM | POA: Diagnosis not present

## 2014-12-27 DIAGNOSIS — R634 Abnormal weight loss: Secondary | ICD-10-CM

## 2014-12-27 DIAGNOSIS — K5790 Diverticulosis of intestine, part unspecified, without perforation or abscess without bleeding: Secondary | ICD-10-CM | POA: Diagnosis not present

## 2014-12-27 DIAGNOSIS — E7800 Pure hypercholesterolemia, unspecified: Secondary | ICD-10-CM

## 2014-12-27 DIAGNOSIS — K5909 Other constipation: Secondary | ICD-10-CM

## 2014-12-27 LAB — COMPREHENSIVE METABOLIC PANEL
ALT: 15 U/L (ref 6–29)
AST: 20 U/L (ref 10–35)
Albumin: 4.1 g/dL (ref 3.6–5.1)
Alkaline Phosphatase: 59 U/L (ref 33–130)
BUN: 13 mg/dL (ref 7–25)
CO2: 28 mmol/L (ref 20–31)
Calcium: 9.5 mg/dL (ref 8.6–10.4)
Chloride: 103 mmol/L (ref 98–110)
Creat: 0.61 mg/dL (ref 0.60–0.88)
Glucose, Bld: 87 mg/dL (ref 65–99)
Potassium: 4.2 mmol/L (ref 3.5–5.3)
Sodium: 142 mmol/L (ref 135–146)
Total Bilirubin: 0.7 mg/dL (ref 0.2–1.2)
Total Protein: 6.8 g/dL (ref 6.1–8.1)

## 2014-12-27 LAB — LIPID PANEL
Cholesterol: 238 mg/dL — ABNORMAL HIGH (ref 125–200)
HDL: 75 mg/dL (ref >=46)
LDL Cholesterol: 147 mg/dL — ABNORMAL HIGH (ref <130)
Total CHOL/HDL Ratio: 3.2 Ratio (ref <=5.0)
Triglycerides: 79 mg/dL (ref <150)
VLDL: 16 mg/dL (ref <30)

## 2014-12-27 LAB — VITAMIN B12: Vitamin B-12: 927 pg/mL — ABNORMAL HIGH (ref 211–911)

## 2014-12-27 LAB — CBC WITH DIFFERENTIAL/PLATELET
Basophils Absolute: 0 10*3/uL (ref 0.0–0.1)
Basophils Relative: 0 % (ref 0–1)
Eosinophils Absolute: 0.1 10*3/uL (ref 0.0–0.7)
Eosinophils Relative: 2 % (ref 0–5)
HCT: 41.3 % (ref 36.0–46.0)
Hemoglobin: 13.6 g/dL (ref 12.0–15.0)
Lymphocytes Relative: 30 % (ref 12–46)
Lymphs Abs: 1.4 10*3/uL (ref 0.7–4.0)
MCH: 30.3 pg (ref 26.0–34.0)
MCHC: 32.9 g/dL (ref 30.0–36.0)
MCV: 92 fL (ref 78.0–100.0)
MPV: 10.8 fL (ref 8.6–12.4)
Monocytes Absolute: 0.5 10*3/uL (ref 0.1–1.0)
Monocytes Relative: 10 % (ref 3–12)
Neutro Abs: 2.7 10*3/uL (ref 1.7–7.7)
Neutrophils Relative %: 58 % (ref 43–77)
Platelets: 252 10*3/uL (ref 150–400)
RBC: 4.49 MIL/uL (ref 3.87–5.11)
RDW: 13.6 % (ref 11.5–15.5)
WBC: 4.6 10*3/uL (ref 4.0–10.5)

## 2014-12-27 LAB — POCT URINALYSIS DIPSTICK
Bilirubin, UA: NEGATIVE
Blood, UA: NEGATIVE
Glucose, UA: NEGATIVE
Ketones, UA: NEGATIVE
Nitrite, UA: NEGATIVE
Protein, UA: NEGATIVE
Spec Grav, UA: 1.015
Urobilinogen, UA: 0.2
pH, UA: 5.5

## 2014-12-27 LAB — TSH: TSH: 4.213 u[IU]/mL (ref 0.350–4.500)

## 2014-12-27 NOTE — Patient Instructions (Signed)
1.  CHANGE TO WHOLE MILK AND DRINK MILK AT EVERY MEAL. 2.  START METAMUCIL DAILY. 3.  CONSIDER BONE DENSITY SCAN. 4.  ADD TWO SNACKS TO THE DAY.  Keeping You Healthy  Get These Tests  Blood Pressure- Have your blood pressure checked by your healthcare provider at least once a year.  Normal blood pressure is 120/80.  Weight- Have your body mass index (BMI) calculated to screen for obesity.  BMI is a measure of body fat based on height and weight.  You can calculate your own BMI at [employer]  Cholesterol- Have your cholesterol checked every year.  Diabetes- Have your blood sugar checked every year if you have high blood pressure, high cholesterol, a family history of diabetes or if you are overweight.  Pap Test - Have a pap test every 1 to 5 years if you have been sexually active.  If you are older than 65 and recent pap tests have been normal you may not need additional pap tests.  In addition, if you have had a hysterectomy  for benign disease additional pap tests are not necessary.  Mammogram-Yearly mammograms are essential for early detection of breast cancer  Screening for Colon Cancer- Colonoscopy starting at age 28. Screening may begin sooner depending on your family history and other health conditions.  Follow up colonoscopy as directed by your Gastroenterologist.  Screening for Osteoporosis- Screening begins at age 54 with bone density scanning, sooner if you are at higher risk for developing Osteoporosis.  Get these medicines  Calcium with Vitamin D- Your body requires 1200-1500 mg of Calcium a day and [phone] IU of Vitamin D a day.  You can only absorb 500 mg of Calcium at a time therefore Calcium must be taken in 2 or 3 separate doses throughout the day.  Hormones- Hormone therapy has been associated with increased risk for certain cancers and heart disease.  Talk to your healthcare provider about if you need relief from menopausal symptoms.  Aspirin- Ask  your healthcare provider about taking Aspirin to prevent Heart Disease and Stroke.  Get these Immuniztions  Flu shot- Every fall  Pneumonia shot- Once after the age of 36; if you are younger ask your healthcare provider if you need a pneumonia shot.  Tetanus- Every ten years.  Zostavax- Once after the age of 62 to prevent shingles.  Take these steps  Don't smoke- Your healthcare provider can help you quit. For tips on how to quit, ask your healthcare provider or go to www.smokefree.gov or call 1-800 QUIT-NOW.  Be physically active- Exercise 5 days a week for a minimum of 30 minutes.  If you are not already physically active, start slow and gradually work up to 30 minutes of moderate physical activity.  Try walking, dancing, bike riding, swimming, etc.  Eat a healthy diet- Eat a variety of healthy foods such as fruits, vegetables, whole grains, low fat milk, low fat cheeses, yogurt, lean meats, chicken, fish, eggs, dried beans, tofu, etc.  For more information go to www.thenutritionsource.org  Dental visit- Brush and floss teeth twice daily; visit your dentist twice a year.  Eye exam- Visit your Optometrist or Ophthalmologist yearly.  Drink alcohol in moderation- Limit alcohol intake to one drink or less a day.  Never drink and drive.  Depression- Your emotional health is as important as your physical health.  If you're feeling down or losing interest in things you normally enjoy, please talk to your healthcare provider.  Seat Belts- can save your  life; always wear one  Smoke/Carbon Monoxide detectors- These detectors need to be installed on the appropriate level of your home.  Replace batteries at least once a year.  Violence- If anyone is threatening or hurting you, please tell your healthcare provider. Living Will/ Health care power of attorney- Discuss with your healthcare provider and family.

## 2014-12-27 NOTE — Progress Notes (Signed)
Subjective:    Patient ID: Monica Ward, female    DOB: Mar 28, 1930, 81 y.o.   MRN: [id number]  12/27/2014  Annual Exam   HPI This 81 y.o. female presents for Annual Wellness Examination.  Last physical: 10-2013 Dr. Everlene Farrier Pap smear: n/a due to age. Mammogram: at age 60; not interested in another mammogram. Colonoscopy:  IFOBT last year negative; 2008 WNL.   Bone density:  15 years ago.  Dr. Everlene Farrier recommended.  Refuses.   TDAP:  Not interested. Pneumovax: Prevnar 13 2015 at pharmacy.  Thinks got Pneumovax at [facility] [address]. Zostavax:  refuses Influenza:  Annually at [facility]. Eye exam:  2014; +glasses.  R eye blindness. Dental exam:  Every six months.  Hyperlipidemia: stopped Lipitor over one year ago; FLP normal in 10/2013.   Weight loss unintentional: weight down 4 pounds.   B: special cereal, banana 1/2, blueberries, 2 cups of coffee with creamer Snack: none Lunch: 1/2 sandwich, fruit, water Snack: none Supper: meat, potatoes, vegetables, ice cream or cookie, water, wine Snack: cinammon toast or muffin. Drinks one glass of 2% milk usually with lunch.   Review of Systems  Constitutional: Negative for fever, chills, diaphoresis, activity change, appetite change, fatigue and unexpected weight change.  HENT: Negative for congestion, dental problem, drooling, ear discharge, ear pain, facial swelling, hearing loss, mouth sores, nosebleeds, postnasal drip, rhinorrhea, sinus pressure, sneezing, sore throat, tinnitus, trouble swallowing and voice change.   Eyes: Negative for photophobia, pain, discharge, redness, itching and visual disturbance.  Respiratory: Negative for apnea, cough, choking, chest tightness, shortness of breath, wheezing and stridor.   Cardiovascular: Negative for chest pain, palpitations and leg swelling.  Gastrointestinal: Positive for constipation. Negative for nausea, vomiting, abdominal pain, diarrhea, blood in stool, abdominal distention, anal bleeding and  rectal pain.  Endocrine: Negative for cold intolerance, heat intolerance, polydipsia, polyphagia and polyuria.  Genitourinary: Negative for dysuria, urgency, frequency, hematuria, flank pain, decreased urine volume, vaginal bleeding, vaginal discharge, enuresis, difficulty urinating, genital sores, vaginal pain, menstrual problem, pelvic pain and dyspareunia.  Musculoskeletal: Negative for myalgias, back pain, joint swelling, arthralgias, gait problem, neck pain and neck stiffness.  Skin: Negative for color change, pallor, rash and wound.  Allergic/Immunologic: Negative for environmental allergies, food allergies and immunocompromised state.  Neurological: Negative for dizziness, tremors, seizures, syncope, facial asymmetry, speech difficulty, weakness, light-headedness, numbness and headaches.  Hematological: Negative for adenopathy. Does not bruise/bleed easily.  Psychiatric/Behavioral: Negative for suicidal ideas, hallucinations, behavioral problems, confusion, sleep disturbance, self-injury, dysphoric mood, decreased concentration and agitation. The patient is not nervous/anxious and is not hyperactive.     Past Medical History  Diagnosis Date  . High cholesterol   . SBO (small bowel obstruction) 08/2009    Early, partial SBO improved with conservative Tx   . Diverticulitis     Per chart   . Cancer Around 2002-2003    Unclear history of "7 lb" abdominal tumor treated at [facility] ~2003 s/p open abdominal colonic resection and radiation  . Complication of anesthesia     "trouble waking up; too much med given" (01/28/2012)  . Arthritis     "probably" (01/28/2012)  . 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 Patient had surgery in 2000 and mass which was presumed to be a leiomyosarcoma rather than a spindle cell cancer. I have no further records of path reports to give me a good definite path diagnosis of her tumor resection.   . Constipation 02/03/2014  .  Diverticulosis 02/03/2014  . Precordial pain 02/12/2012  . Constipation, chronic 02/06/2014   Past Surgical History  Procedure Laterality Date  . Cesarean section  1968  . Tumor removal  1990's;  X 2    "benign"  . Appendectomy  1968  . Tonsillectomy      "when I was a child"  . Colectomy  2003    "cancer"  . Abdominal hysterectomy  ~ 1985    ovaries resected B; DUB.   No Known Allergies Current Outpatient Prescriptions  Medication Sig Dispense Refill  . acetaminophen (TYLENOL) 325 MG tablet Take 325 mg by mouth every 6 (six) hours as needed for moderate pain.    . Ascorbic Acid (VITAMIN C) 100 MG tablet Take 100 mg by mouth daily.    Marland Kitchen aspirin 81 MG chewable tablet Chew 81 mg by mouth once.     . Cyanocobalamin (B-12 PO) Take 1 tablet by mouth daily.    Marland Kitchen loperamide (IMODIUM) 2 MG capsule Take 1 capsule (2 mg total) by mouth 4 (four) times daily as needed for diarrhea or loose stools. 12 capsule 0  . Multiple Vitamin (MULITIVITAMIN WITH MINERALS) TABS Take 1 tablet by mouth daily.    . Multiple Vitamins-Minerals (MULTIVITAMIN WITH MINERALS) tablet Take 1 tablet by mouth daily.    . Multiple Vitamins-Minerals (ZINC PO) Take 1 tablet by mouth daily.    . Omega-3 Fatty Acids (OMEGA 3 PO) Take 1 capsule by mouth daily.    . polyethylene glycol (MIRALAX / GLYCOLAX) packet Take 17 g by mouth 2 (two) times daily. 14 each 0  . psyllium (METAMUCIL SMOOTH TEXTURE) 28 % packet Take 1 packet by mouth 2 (two) times daily.    Marland Kitchen senna-docusate (SENOKOT S) 8.6-50 MG per tablet Take 1 tablet by mouth at bedtime.    . Simethicone (GAS-X PO) Take 1 tablet by mouth once as needed. As needed for gas.    Marland Kitchen VITAMIN E PO Take 1 tablet by mouth daily.    Marland Kitchen zinc gluconate 50 MG tablet Take 50 mg by mouth daily.     No current facility-administered medications for this visit.   Social History   Social History  . Marital Status: Married    Spouse Name: N/A  . Number of Children: N/A  . Years of  Education: N/A   Occupational History  . Not on file.   Social History Main Topics  . Smoking status: Never Smoker   . Smokeless tobacco: Never Used  . Alcohol Use: 4.2 oz/week    7 Glasses of wine per week     Comment: social  . Drug Use: No  . Sexual Activity: Not Currently   Other Topics Concern  . Not on file   Social History Narrative      Marital status: married x 65 years; Husband with new onset dementia in 2016.      Children: 3 children in area; 4 grandchildren; no gg.      Lives: with husband in [place]. Goes to [facility] every winter.      Employment: Retired      Tobacco: smoked briefly in college      Alcohol: 3 glasses of wine per week.      Exercise:  Very little.      ADLs: performs all ADLs; still drives.  No assistant devices.      Advanced Directives:  FULL CODE; no prolonged measures.         Family History  Problem  Relation Age of Onset  . Heart failure Father 65    Heart failure  . Heart disease Father        Objective:    BP 134/72 mmHg  Pulse 58  Temp(Src) 97.2 F (36.2 C) (Oral)  Resp 16  Ht 5\' 7"  (1.702 m)  Wt 116 lb (52.617 kg)  BMI 18.16 kg/m2 Physical Exam  Constitutional: She is oriented to person, place, and time. She appears well-developed and well-nourished. No distress.  HENT:  Head: Normocephalic and atraumatic.  Right Ear: External ear normal.  Left Ear: External ear normal.  Nose: Nose normal.  Mouth/Throat: Oropharynx is clear and moist.  Eyes: Conjunctivae and EOM are normal. Pupils are equal, round, and reactive to light.  Neck: Normal range of motion and full passive range of motion without pain. Neck supple. No JVD present. Carotid bruit is not present. No thyromegaly present.  Cardiovascular: Normal rate, regular rhythm and normal heart sounds.  Exam reveals no gallop and no friction rub.   No murmur heard. Pulmonary/Chest: Effort normal and breath sounds normal. She has no wheezes. She has no rales.  Abdominal: Soft.  Bowel sounds are normal. She exhibits no distension and no mass. There is no tenderness. There is no rebound and no guarding.  Well healed midline scar from epigastric region to lower abdomen.  Musculoskeletal:       Right shoulder: Normal.       Left shoulder: Normal.       Cervical back: Normal.  Lymphadenopathy:    She has no cervical adenopathy.  Neurological: She is alert and oriented to person, place, and time. She has normal reflexes. No cranial nerve deficit. She exhibits normal muscle tone. Coordination normal.  Skin: Skin is warm and dry. No rash noted. She is not diaphoretic. No erythema. No pallor.  Diffuse sun related changes throughout.  Psychiatric: She has a normal mood and affect. Her behavior is normal. Judgment and thought content normal.  Nursing note and vitals reviewed.       Assessment & Plan:   1. Encounter for Medicare annual wellness exam   2. Constipation, chronic   3. B12 deficiency   4. Pure hypercholesterolemia   5. Screening for diabetes mellitus   6. Diverticulosis of intestine without bleeding, unspecified intestinal tract location   7. Weight loss, unintentional   8. Urge incontinence     1.  Medicare Annual Wellness Examination and Complete Physical Examination: anticipatory guidance --- ASA 81mg  daily, regular exercise, 3 servings of dairy daily. No longer warrants pap smears/mammograms/colonoscopies.  Discussed immunizations: will clarify date of Pneumovax.  Will receive flu vaccine at pharmacy; declines Zostavax.  No evidence of hearing loss or depression.  Moderate fall risk.  Independent with ADLs.  +advanced directives; desires FULL CODE.  +urinary incontinence; wears pad and changes once per day. No falls in the past year. Highly recommend bone density scan; pt to consider. 2.  Constipation: chronic; recommend Metamucil daily; use Miralax once daily PRN; continue fruits and vegetables with each meal. 3.  Vitamin B12 deficiency: stable; obtain  labs. 4.  Hypercholesterolemia: improved last year; has stopped Lipitor; obtain labs. 5.  Screening DMII: obtain glucose. 6.  Weight loss unintentional: weight down 4 pounds from last year; change milk to whole milk and drink one glass with each meal; add high protein high fat snacks between meals (ice cream, peanut butter).   Weigh weekly at home and return to office if weight continues to decline. 7. Urge  Incontinence: New.  Wears one pad throughout the day; coping well.  Does not desire trial of medication.   Orders Placed This Encounter  Procedures  . CBC with Differential/Platelet  . Comprehensive metabolic panel    Order Specific Question:  Has the patient fasted?    Answer:  Yes  . Lipid panel    Order Specific Question:  Has the patient fasted?    Answer:  Yes  . Vitamin B12  . TSH  . POCT urinalysis dipstick   Meds ordered this encounter  Medications  . Multiple Vitamins-Minerals (MULTIVITAMIN WITH MINERALS) tablet    Sig: Take 1 tablet by mouth daily.  Marland Kitchen zinc gluconate 50 MG tablet    Sig: Take 50 mg by mouth daily.  . Ascorbic Acid (VITAMIN C) 100 MG tablet    Sig: Take 100 mg by mouth daily.    Return in about 1 year (around 12/27/2015) for complete physical examiniation.    Kristi Elayne Guerin, M.D. [facility] [address] [place], [place]  [address] [phone] phone [phone] fax

## 2014-12-27 NOTE — Progress Notes (Signed)
   Subjective:    Patient ID: Monica Ward, female    DOB: 01/14/1930, 81 y.o.   MRN: [id number]  HPI    Review of Systems  Constitutional: Negative.   HENT: Negative.   Eyes: Negative.   Respiratory: Negative.   Cardiovascular: Negative.   Gastrointestinal: Positive for constipation.  Endocrine: Negative.   Genitourinary: Negative.   Musculoskeletal: Negative.   Skin: Negative.   Allergic/Immunologic: Negative.   Neurological: Negative.   Hematological: Negative.   Psychiatric/Behavioral: Negative.        Objective:   Physical Exam        Assessment & Plan:

## 2014-12-30 ENCOUNTER — Encounter: Payer: Self-pay | Admitting: Family Medicine

## 2015-01-17 ENCOUNTER — Telehealth: Payer: Self-pay | Admitting: Family Medicine

## 2015-01-17 NOTE — Telephone Encounter (Signed)
Monica Ward came in saying she noticed from her recent labs that her cholesterol is increased and she's wondering if Dr. Tamala Julian can prescribe the same Generic Rx she prescribed for her before. She didn't know the name of it. She said you can call her if you have questions or if this can't be done. Pt ph# [phone] Thank you.

## 2015-01-19 MED ORDER — ATORVASTATIN CALCIUM 10 MG PO TABS: 10.0000 mg | ORAL_TABLET | Freq: Every day | ORAL | Status: DC

## 2015-01-19 NOTE — Telephone Encounter (Signed)
Pt called again in regards to cholesterol Rx. See previous message.

## 2015-01-19 NOTE — Telephone Encounter (Signed)
Call --- I sent in Atorvastatin 10mg  daily to the pharmacy for elevated cholesterol.  I recommend appointment in 3-6 months for repeat cholesterol level and to confirm that patient is tolerating medication.  Scheduling, please schedule OV with me in 3-6 months.

## 2015-01-19 NOTE — Telephone Encounter (Signed)
Spoke with pt, advised message from Dr. Smith. Pt understood. 

## 2015-01-19 NOTE — Telephone Encounter (Signed)
Left message for pt to call back  °

## 2015-01-30 ENCOUNTER — Encounter: Payer: Self-pay | Admitting: Emergency Medicine

## 2015-01-31 ENCOUNTER — Inpatient Hospital Stay (HOSPITAL_COMMUNITY): Payer: Medicare Other

## 2015-01-31 ENCOUNTER — Encounter (HOSPITAL_COMMUNITY): Payer: Self-pay | Admitting: *Deleted

## 2015-01-31 ENCOUNTER — Encounter (HOSPITAL_COMMUNITY)
Admission: EM | Disposition: A | Discharge status: Home Health | Payer: Self-pay | Source: Home / Self Care | Attending: Internal Medicine

## 2015-01-31 ENCOUNTER — Inpatient Hospital Stay (HOSPITAL_COMMUNITY): Payer: Medicare Other | Admitting: Anesthesiology

## 2015-01-31 ENCOUNTER — Emergency Department (HOSPITAL_COMMUNITY): Payer: Medicare Other

## 2015-01-31 ENCOUNTER — Inpatient Hospital Stay (HOSPITAL_COMMUNITY)
Admission: EM | Admit: 2015-01-31 | Discharge: 2015-02-02 | DRG: 470 | Disposition: A | Discharge status: Home Health | Payer: Medicare Other | Attending: Internal Medicine | Admitting: Internal Medicine

## 2015-01-31 DIAGNOSIS — Z8249 Family history of ischemic heart disease and other diseases of the circulatory system: Secondary | ICD-10-CM

## 2015-01-31 DIAGNOSIS — Y92009 Unspecified place in unspecified non-institutional (private) residence as the place of occurrence of the external cause: Secondary | ICD-10-CM

## 2015-01-31 DIAGNOSIS — M25552 Pain in left hip: Secondary | ICD-10-CM

## 2015-01-31 DIAGNOSIS — D696 Thrombocytopenia, unspecified: Secondary | ICD-10-CM | POA: Diagnosis not present

## 2015-01-31 DIAGNOSIS — E7800 Pure hypercholesterolemia, unspecified: Secondary | ICD-10-CM | POA: Diagnosis present

## 2015-01-31 DIAGNOSIS — E440 Moderate protein-calorie malnutrition: Secondary | ICD-10-CM

## 2015-01-31 DIAGNOSIS — R32 Unspecified urinary incontinence: Secondary | ICD-10-CM | POA: Diagnosis present

## 2015-01-31 DIAGNOSIS — Z90710 Acquired absence of both cervix and uterus: Secondary | ICD-10-CM

## 2015-01-31 DIAGNOSIS — D62 Acute posthemorrhagic anemia: Secondary | ICD-10-CM

## 2015-01-31 DIAGNOSIS — Z7982 Long term (current) use of aspirin: Secondary | ICD-10-CM | POA: Diagnosis not present

## 2015-01-31 DIAGNOSIS — Z681 Body mass index (BMI) 19 or less, adult: Secondary | ICD-10-CM | POA: Diagnosis not present

## 2015-01-31 DIAGNOSIS — M1990 Unspecified osteoarthritis, unspecified site: Secondary | ICD-10-CM | POA: Diagnosis present

## 2015-01-31 DIAGNOSIS — Z09 Encounter for follow-up examination after completed treatment for conditions other than malignant neoplasm: Secondary | ICD-10-CM

## 2015-01-31 DIAGNOSIS — R739 Hyperglycemia, unspecified: Secondary | ICD-10-CM | POA: Diagnosis present

## 2015-01-31 DIAGNOSIS — K5900 Constipation, unspecified: Secondary | ICD-10-CM | POA: Diagnosis present

## 2015-01-31 DIAGNOSIS — Z79899 Other long term (current) drug therapy: Secondary | ICD-10-CM | POA: Diagnosis not present

## 2015-01-31 DIAGNOSIS — S72012D Unspecified intracapsular fracture of left femur, subsequent encounter for closed fracture with routine healing: Secondary | ICD-10-CM | POA: Diagnosis not present

## 2015-01-31 DIAGNOSIS — S72012A Unspecified intracapsular fracture of left femur, initial encounter for closed fracture: Secondary | ICD-10-CM | POA: Diagnosis present

## 2015-01-31 DIAGNOSIS — K5909 Other constipation: Secondary | ICD-10-CM | POA: Diagnosis present

## 2015-01-31 DIAGNOSIS — E785 Hyperlipidemia, unspecified: Secondary | ICD-10-CM | POA: Diagnosis present

## 2015-01-31 DIAGNOSIS — W010XXA Fall on same level from slipping, tripping and stumbling without subsequent striking against object, initial encounter: Secondary | ICD-10-CM | POA: Diagnosis present

## 2015-01-31 DIAGNOSIS — S72002A Fracture of unspecified part of neck of left femur, initial encounter for closed fracture: Secondary | ICD-10-CM | POA: Diagnosis present

## 2015-01-31 HISTORY — PX: TOTAL HIP ARTHROPLASTY: SHX124

## 2015-01-31 LAB — CBC WITH DIFFERENTIAL/PLATELET
Basophils Absolute: 0 10*3/uL (ref 0.0–0.1)
Basophils Relative: 0 %
Eosinophils Absolute: 0 10*3/uL (ref 0.0–0.7)
Eosinophils Relative: 0 %
HCT: 39.1 % (ref 36.0–46.0)
Hemoglobin: 12.8 g/dL (ref 12.0–15.0)
Lymphocytes Relative: 6 %
Lymphs Abs: 0.5 10*3/uL — ABNORMAL LOW (ref 0.7–4.0)
MCH: 30.7 pg (ref 26.0–34.0)
MCHC: 32.7 g/dL (ref 30.0–36.0)
MCV: 93.8 fL (ref 78.0–100.0)
Monocytes Absolute: 0.4 10*3/uL (ref 0.1–1.0)
Monocytes Relative: 5 %
Neutro Abs: 7.3 10*3/uL (ref 1.7–7.7)
Neutrophils Relative %: 89 %
Platelets: 171 10*3/uL (ref 150–400)
RBC: 4.17 MIL/uL (ref 3.87–5.11)
RDW: 13.4 % (ref 11.5–15.5)
WBC: 8.3 10*3/uL (ref 4.0–10.5)

## 2015-01-31 LAB — URINALYSIS, ROUTINE W REFLEX MICROSCOPIC
Bilirubin Urine: NEGATIVE
Glucose, UA: NEGATIVE mg/dL
Hgb urine dipstick: NEGATIVE
Ketones, ur: NEGATIVE mg/dL
Leukocytes, UA: NEGATIVE
Nitrite: NEGATIVE
Protein, ur: NEGATIVE mg/dL
Specific Gravity, Urine: 1.016 (ref 1.005–1.030)
Urobilinogen, UA: 1 mg/dL (ref 0.0–1.0)
pH: 7 (ref 5.0–8.0)

## 2015-01-31 LAB — BASIC METABOLIC PANEL
Anion gap: 6 (ref 5–15)
BUN: 16 mg/dL (ref 6–20)
CO2: 27 mmol/L (ref 22–32)
Calcium: 8.9 mg/dL (ref 8.9–10.3)
Chloride: 106 mmol/L (ref 101–111)
Creatinine, Ser: 0.52 mg/dL (ref 0.44–1.00)
GFR calc Af Amer: >60 mL/min (ref >60)
GFR calc non Af Amer: >60 mL/min (ref >60)
Glucose, Bld: 135 mg/dL — ABNORMAL HIGH (ref 65–99)
Potassium: 3.6 mmol/L (ref 3.5–5.1)
Sodium: 139 mmol/L (ref 135–145)

## 2015-01-31 LAB — TYPE AND SCREEN
ABO/RH(D): O POS
Antibody Screen: NEGATIVE

## 2015-01-31 LAB — PROTIME-INR
INR: 1.08 (ref 0.00–1.49)
Prothrombin Time: 14.2 seconds (ref 11.6–15.2)

## 2015-01-31 LAB — ABO/RH: ABO/RH(D): O POS

## 2015-01-31 LAB — CBG MONITORING, ED: Glucose-Capillary: 122 mg/dL — ABNORMAL HIGH (ref 65–99)

## 2015-01-31 SURGERY — ARTHROPLASTY, HIP, TOTAL, ANTERIOR APPROACH
Anesthesia: Spinal | Site: Hip | Laterality: Left

## 2015-01-31 MED ORDER — ACETAMINOPHEN 325 MG PO TABS
650.0000 mg | ORAL_TABLET | Freq: Four times a day (QID) | ORAL | Status: DC | PRN
  Administered 2015-02-01: 650 mg via ORAL
  Filled 2015-01-31: qty 2

## 2015-01-31 MED ORDER — PROPOFOL 500 MG/50ML IV EMUL
INTRAVENOUS | Status: DC | PRN
  Administered 2015-01-31: 25 ug/kg/min via INTRAVENOUS

## 2015-01-31 MED ORDER — FENTANYL CITRATE (PF) 100 MCG/2ML IJ SOLN
INTRAMUSCULAR | Status: AC
  Filled 2015-01-31: qty 4

## 2015-01-31 MED ORDER — SODIUM CHLORIDE 0.9 % IV SOLN
INTRAVENOUS | Status: DC
  Administered 2015-01-31: 1000 mL via INTRAVENOUS

## 2015-01-31 MED ORDER — MIDAZOLAM HCL 5 MG/5ML IJ SOLN
INTRAMUSCULAR | Status: DC | PRN
  Administered 2015-01-31 (×2): 1 mg via INTRAVENOUS

## 2015-01-31 MED ORDER — KETOROLAC TROMETHAMINE 30 MG/ML IJ SOLN
INTRAMUSCULAR | Status: DC | PRN
  Administered 2015-01-31: 30 mg

## 2015-01-31 MED ORDER — ACETAMINOPHEN 500 MG PO TABS: 1000.0000 mg | ORAL_TABLET | Freq: Four times a day (QID) | ORAL | Status: DC | PRN

## 2015-01-31 MED ORDER — HYDROCODONE-ACETAMINOPHEN 5-325 MG PO TABS
1.0000 | ORAL_TABLET | Freq: Four times a day (QID) | ORAL | Status: DC | PRN
  Administered 2015-02-01: 2 via ORAL
  Administered 2015-02-01: 1 via ORAL
  Administered 2015-02-02 (×2): 2 via ORAL
  Filled 2015-01-31 (×2): qty 2
  Filled 2015-01-31: qty 1
  Filled 2015-01-31: qty 2

## 2015-01-31 MED ORDER — ISOPROPYL ALCOHOL 70 % SOLN
Status: DC | PRN
  Administered 2015-01-31: 1 via TOPICAL

## 2015-01-31 MED ORDER — CEFAZOLIN SODIUM-DEXTROSE 2-3 GM-% IV SOLR
2.0000 g | Freq: Four times a day (QID) | INTRAVENOUS | Status: AC
  Administered 2015-01-31 – 2015-02-01 (×2): 2 g via INTRAVENOUS
  Filled 2015-01-31 (×2): qty 50

## 2015-01-31 MED ORDER — METOCLOPRAMIDE HCL 5 MG/ML IJ SOLN: 5.0000 mg | Freq: Three times a day (TID) | INTRAMUSCULAR | Status: DC | PRN

## 2015-01-31 MED ORDER — ONDANSETRON HCL 4 MG/2ML IJ SOLN: 4.0000 mg | Freq: Four times a day (QID) | INTRAMUSCULAR | Status: DC | PRN

## 2015-01-31 MED ORDER — SODIUM CHLORIDE 0.9 % IJ SOLN
INTRAMUSCULAR | Status: DC | PRN
Start: 2015-01-31 — End: 2015-01-31
  Administered 2015-01-31: 30 mL

## 2015-01-31 MED ORDER — KETOROLAC TROMETHAMINE 30 MG/ML IJ SOLN
INTRAMUSCULAR | Status: AC
  Filled 2015-01-31: qty 1

## 2015-01-31 MED ORDER — BUPIVACAINE HCL (PF) 0.5 % IJ SOLN
INTRAMUSCULAR | Status: AC
  Filled 2015-01-31: qty 30

## 2015-01-31 MED ORDER — CEFAZOLIN SODIUM 1-5 GM-% IV SOLN
1.0000 g | INTRAVENOUS | Status: AC
  Administered 2015-01-31: 1 g via INTRAVENOUS
  Filled 2015-01-31: qty 50

## 2015-01-31 MED ORDER — WATER FOR IRRIGATION, STERILE IR SOLN
Status: DC | PRN
  Administered 2015-01-31: 1000 mL

## 2015-01-31 MED ORDER — KETAMINE HCL 10 MG/ML IJ SOLN
INTRAMUSCULAR | Status: AC
  Filled 2015-01-31: qty 1

## 2015-01-31 MED ORDER — FENTANYL CITRATE (PF) 100 MCG/2ML IJ SOLN
INTRAMUSCULAR | Status: DC | PRN
  Administered 2015-01-31 (×4): 25 ug via INTRAVENOUS

## 2015-01-31 MED ORDER — PROPOFOL 10 MG/ML IV BOLUS
INTRAVENOUS | Status: AC
  Filled 2015-01-31: qty 20

## 2015-01-31 MED ORDER — DEXAMETHASONE SODIUM PHOSPHATE 10 MG/ML IJ SOLN
INTRAMUSCULAR | Status: DC | PRN
  Administered 2015-01-31: 10 mg via INTRAVENOUS

## 2015-01-31 MED ORDER — BUPIVACAINE-EPINEPHRINE (PF) 0.25% -1:200000 IJ SOLN
INTRAMUSCULAR | Status: AC
  Filled 2015-01-31: qty 30

## 2015-01-31 MED ORDER — DOCUSATE SODIUM 100 MG PO CAPS
100.0000 mg | ORAL_CAPSULE | Freq: Two times a day (BID) | ORAL | Status: DC
  Administered 2015-02-01 – 2015-02-02 (×3): 100 mg via ORAL

## 2015-01-31 MED ORDER — HYDROGEN PEROXIDE 3 % EX SOLN
CUTANEOUS | Status: DC | PRN
Start: 2015-01-31 — End: 2015-01-31
  Administered 2015-01-31: 1 via TOPICAL

## 2015-01-31 MED ORDER — ISOPROPYL ALCOHOL 70 % SOLN
Status: AC
  Filled 2015-01-31: qty 480

## 2015-01-31 MED ORDER — BISACODYL 10 MG RE SUPP: 10.0000 mg | Freq: Every day | RECTAL | Status: DC | PRN

## 2015-01-31 MED ORDER — HYDROGEN PEROXIDE 3 % EX SOLN
CUTANEOUS | Status: AC
  Filled 2015-01-31: qty 473

## 2015-01-31 MED ORDER — SODIUM CHLORIDE 0.9 % IR SOLN
Status: DC | PRN
  Administered 2015-01-31: 1000 mL

## 2015-01-31 MED ORDER — BUPIVACAINE HCL (PF) 0.5 % IJ SOLN
INTRAMUSCULAR | Status: DC | PRN
  Administered 2015-01-31: 15 mg

## 2015-01-31 MED ORDER — FENTANYL CITRATE (PF) 100 MCG/2ML IJ SOLN
50.0000 ug | INTRAMUSCULAR | Status: DC | PRN
Start: 2015-01-31 — End: 2015-01-31
  Administered 2015-01-31: 50 ug via INTRAVENOUS
  Filled 2015-01-31: qty 2

## 2015-01-31 MED ORDER — ATORVASTATIN CALCIUM 10 MG PO TABS
10.0000 mg | ORAL_TABLET | Freq: Every day | ORAL | Status: DC
  Administered 2015-02-01: 10 mg via ORAL
  Filled 2015-01-31 (×3): qty 1

## 2015-01-31 MED ORDER — LIDOCAINE HCL (CARDIAC) 20 MG/ML IV SOLN
INTRAVENOUS | Status: AC
  Filled 2015-01-31: qty 5

## 2015-01-31 MED ORDER — ACETAMINOPHEN 650 MG RE SUPP: 650.0000 mg | Freq: Four times a day (QID) | RECTAL | Status: DC | PRN

## 2015-01-31 MED ORDER — ROCURONIUM BROMIDE 100 MG/10ML IV SOLN
INTRAVENOUS | Status: AC
  Filled 2015-01-31: qty 1

## 2015-01-31 MED ORDER — EPHEDRINE SULFATE 50 MG/ML IJ SOLN
INTRAMUSCULAR | Status: DC | PRN
  Administered 2015-01-31: 5 mg via INTRAVENOUS

## 2015-01-31 MED ORDER — POLYETHYLENE GLYCOL 3350 17 G PO PACK
17.0000 g | PACK | Freq: Two times a day (BID) | ORAL | Status: DC
Start: 2015-01-31 — End: 2015-02-02
  Administered 2015-02-01 – 2015-02-02 (×3): 17 g via ORAL

## 2015-01-31 MED ORDER — TRANEXAMIC ACID 1000 MG/10ML IV SOLN
1000.0000 mg | INTRAVENOUS | Status: AC
  Administered 2015-01-31: 1000 mg via INTRAVENOUS
  Filled 2015-01-31: qty 10

## 2015-01-31 MED ORDER — METOCLOPRAMIDE HCL 10 MG PO TABS: 5.0000 mg | ORAL_TABLET | Freq: Three times a day (TID) | ORAL | Status: DC | PRN

## 2015-01-31 MED ORDER — KCL IN DEXTROSE-NACL 20-5-0.45 MEQ/L-%-% IV SOLN
INTRAVENOUS | Status: DC
  Administered 2015-01-31: 22:00:00 via INTRAVENOUS
  Filled 2015-01-31 (×4): qty 1000

## 2015-01-31 MED ORDER — ASPIRIN EC 325 MG PO TBEC
325.0000 mg | DELAYED_RELEASE_TABLET | Freq: Two times a day (BID) | ORAL | Status: DC
  Administered 2015-02-01 – 2015-02-02 (×3): 325 mg via ORAL
  Filled 2015-01-31 (×5): qty 1

## 2015-01-31 MED ORDER — FENTANYL CITRATE (PF) 100 MCG/2ML IJ SOLN: 25.0000 ug | INTRAMUSCULAR | Status: DC | PRN

## 2015-01-31 MED ORDER — HYDROCODONE-ACETAMINOPHEN 5-325 MG PO TABS: 1.0000 | ORAL_TABLET | ORAL | Status: DC | PRN

## 2015-01-31 MED ORDER — LACTATED RINGERS IV SOLN
INTRAVENOUS | Status: DC
  Administered 2015-01-31: 16:00:00 via INTRAVENOUS
  Administered 2015-01-31: 1000 mL via INTRAVENOUS

## 2015-01-31 MED ORDER — KETAMINE HCL 10 MG/ML IJ SOLN
INTRAMUSCULAR | Status: DC | PRN
  Administered 2015-01-31 (×2): 10 mg via INTRAVENOUS

## 2015-01-31 MED ORDER — ADULT MULTIVITAMIN W/MINERALS CH
1.0000 | ORAL_TABLET | Freq: Every day | ORAL | Status: DC
  Administered 2015-02-01 – 2015-02-02 (×2): 1 via ORAL
  Filled 2015-01-31 (×3): qty 1

## 2015-01-31 MED ORDER — MENTHOL 3 MG MT LOZG: 1.0000 | LOZENGE | OROMUCOSAL | Status: DC | PRN

## 2015-01-31 MED ORDER — ONDANSETRON HCL 4 MG/2ML IJ SOLN
INTRAMUSCULAR | Status: DC | PRN
  Administered 2015-01-31: 4 mg via INTRAVENOUS

## 2015-01-31 MED ORDER — MORPHINE SULFATE (PF) 2 MG/ML IV SOLN: 0.5000 mg | INTRAVENOUS | Status: DC | PRN

## 2015-01-31 MED ORDER — SENNA 8.6 MG PO TABS
1.0000 | ORAL_TABLET | Freq: Two times a day (BID) | ORAL | Status: DC
  Administered 2015-02-02: 8.6 mg via ORAL

## 2015-01-31 MED ORDER — ONDANSETRON HCL 4 MG PO TABS: 4.0000 mg | ORAL_TABLET | Freq: Four times a day (QID) | ORAL | Status: DC | PRN

## 2015-01-31 MED ORDER — PHENOL 1.4 % MT LIQD: 1.0000 | OROMUCOSAL | Status: DC | PRN

## 2015-01-31 MED ORDER — BUPIVACAINE-EPINEPHRINE 0.25% -1:200000 IJ SOLN
INTRAMUSCULAR | Status: DC | PRN
  Administered 2015-01-31: 30 mL

## 2015-01-31 MED ORDER — SODIUM CHLORIDE 0.9 % IJ SOLN
INTRAMUSCULAR | Status: AC
  Filled 2015-01-31: qty 50

## 2015-01-31 MED ORDER — MIDAZOLAM HCL 2 MG/2ML IJ SOLN
INTRAMUSCULAR | Status: AC
  Filled 2015-01-31: qty 4

## 2015-01-31 SURGICAL SUPPLY — 46 items
BAG DECANTER FOR FLEXI CONT (MISCELLANEOUS) IMPLANT
BAG ZIPLOCK 12X15 (MISCELLANEOUS) IMPLANT
CAPT HIP HEMI 2 ×2 IMPLANT
CHLORAPREP W/TINT 26ML (MISCELLANEOUS) ×2 IMPLANT
COVER PERINEAL POST (MISCELLANEOUS) ×2 IMPLANT
DECANTER SPIKE VIAL GLASS SM (MISCELLANEOUS) ×2 IMPLANT
DRAPE C-ARM 42X120 X-RAY (DRAPES) ×2 IMPLANT
DRAPE LG THREE QUARTER DISP (DRAPES) ×4 IMPLANT
DRAPE STERI IOBAN 125X83 (DRAPES) ×2 IMPLANT
DRAPE U-SHAPE 47X51 STRL (DRAPES) ×6 IMPLANT
DRSG AQUACEL AG ADV 3.5X10 (GAUZE/BANDAGES/DRESSINGS) ×2 IMPLANT
ELECT BLADE TIP CTD 4 INCH (ELECTRODE) ×2 IMPLANT
ELECT PENCIL ROCKER SW 15FT (MISCELLANEOUS) IMPLANT
ELECT REM PT RETURN 15FT ADLT (MISCELLANEOUS) ×2 IMPLANT
FACESHIELD WRAPAROUND (MASK) ×4 IMPLANT
GAUZE SPONGE 4X4 12PLY STRL (GAUZE/BANDAGES/DRESSINGS) ×2 IMPLANT
GLOVE BIO SURGEON STRL SZ8.5 (GLOVE) ×4 IMPLANT
GLOVE BIOGEL PI IND STRL 8.5 (GLOVE) ×1 IMPLANT
GLOVE BIOGEL PI INDICATOR 8.5 (GLOVE) ×1
GOWN SPEC L3 XXLG W/TWL (GOWN DISPOSABLE) ×2 IMPLANT
HANDPIECE INTERPULSE COAX TIP (DISPOSABLE) ×2
HOLDER FOLEY CATH W/STRAP (MISCELLANEOUS) ×2 IMPLANT
HOOD PEEL AWAY FACE SHEILD DIS (HOOD) ×2 IMPLANT
KIT BASIN OR (CUSTOM PROCEDURE TRAY) ×2 IMPLANT
LIQUID BAND (GAUZE/BANDAGES/DRESSINGS) ×2 IMPLANT
NEEDLE SPNL 18GX3.5 QUINCKE PK (NEEDLE) ×2 IMPLANT
PACK TOTAL JOINT (CUSTOM PROCEDURE TRAY) ×2 IMPLANT
PEN SKIN MARKING BROAD (MISCELLANEOUS) ×2 IMPLANT
SAW OSC TIP CART 19.5X105X1.3 (SAW) ×2 IMPLANT
SEALER BIPOLAR AQUA 6.0 (INSTRUMENTS) ×2 IMPLANT
SET HNDPC FAN SPRY TIP SCT (DISPOSABLE) ×1 IMPLANT
SOL PREP POV-IOD 4OZ 10% (MISCELLANEOUS) ×2 IMPLANT
SUT ETHIBOND NAB CT1 #1 30IN (SUTURE) ×4 IMPLANT
SUT MNCRL AB 3-0 PS2 18 (SUTURE) ×2 IMPLANT
SUT MON AB 2-0 CT1 36 (SUTURE) ×4 IMPLANT
SUT VIC AB 1 CT1 36 (SUTURE) ×2 IMPLANT
SUT VIC AB 2-0 CT1 27 (SUTURE) ×1
SUT VIC AB 2-0 CT1 TAPERPNT 27 (SUTURE) ×1 IMPLANT
SUT VLOC 180 0 24IN GS25 (SUTURE) ×2 IMPLANT
SYR 50ML LL SCALE MARK (SYRINGE) ×2 IMPLANT
TOWEL OR 17X26 10 PK STRL BLUE (TOWEL DISPOSABLE) ×2 IMPLANT
TOWEL OR NON WOVEN STRL DISP B (DISPOSABLE) ×2 IMPLANT
TRAY FOLEY W/METER SILVER 14FR (SET/KITS/TRAYS/PACK) ×2 IMPLANT
TRAY FOLEY W/METER SILVER 16FR (SET/KITS/TRAYS/PACK) IMPLANT
WATER STERILE IRR 1500ML POUR (IV SOLUTION) ×2 IMPLANT
YANKAUER SUCT BULB TIP 10FT TU (MISCELLANEOUS) ×2 IMPLANT

## 2015-01-31 NOTE — Transfer of Care (Signed)
Immediate Anesthesia Transfer of Care Note  Patient: Monica Ward  Procedure(s) Performed: Procedure(s):  ANTERIOR APPROACH  LEFT HEMI ARTHROPLASTY (Left)  Patient Location: PACU  Anesthesia Type:Spinal  Level of Consciousness: sedated  Airway & Oxygen Therapy: Patient Spontanous Breathing and Patient connected to face mask oxygen  Post-op Assessment: Report given to RN and Post -op Vital signs reviewed and stable  Post vital signs: Reviewed and stable  Last Vitals:  Filed Vitals:   01/31/15 1447  BP:   Pulse:   Temp: 36.9 C  Resp:     Complications: No apparent anesthesia complications

## 2015-01-31 NOTE — Anesthesia Procedure Notes (Signed)
Spinal Patient location during procedure: OR Start time: 01/31/2015 3:52 PM End time: 01/31/2015 3:56 PM Spinal Block Patient position: left lateral decubitus Prep: Betadine Patient monitoring: heart rate, cardiac monitor, continuous pulse ox and blood pressure Approach: right paramedian Location: L3-4 Injection technique: single-shot Needle Needle type: Spinocan  Needle gauge: 22 G Needle length: 9 cm Needle insertion depth: 7 cm Assessment Sensory level: T6 Additional Notes Timeout performed. Spinal kit date checked. SAB without difficulty

## 2015-01-31 NOTE — Op Note (Signed)
OPERATIVE REPORT  SURGEON: Rod Can, MD   ASSISTANT: None.  PREOPERATIVE DIAGNOSIS: Displaced Left femoral neck fracture.   POSTOPERATIVE DIAGNOSIS: Displaced Left femoral neck fracture.   PROCEDURE: Left hip hemiarthroplasty, anterior approach.   IMPLANTS: DePuy Tri Lock stem, size 7, hi offset, with a -3 mm spacer and a 47 mm monopolar head ball.  ANESTHESIA:  Spinal  ANTIBIOTICS: 1g ancef.  ESTIMATED BLOOD LOSS: 150 mL.  DRAINS: None.  COMPLICATIONS: None   CONDITION: PACU - hemodynamically stable.   BRIEF CLINICAL NOTE: Monica Ward is a 81 y.o. female with a displaced Left femoral neck fracture. The patient was admitted to the hospitalist service and underwent perioperative risk stratification and medical optimization. The risks, benefits, and alternatives to hemiarthroplasty were explained, and the patient elected to proceed.  PROCEDURE IN DETAIL: The patient was taken to the operating room and general anesthesia was induced on the hospital bed. The patient was then positioned on the Hana table. All bony prominences were well padded. The hip was prepped and draped in the normal sterile surgical fashion. A time-out was called verifying side and site of surgery. Antibiotics were given within 60 minutes of beginning the procedure.  The direct anterior approach to the hip was performed through the Hueter interval. Lateral femoral circumflex vessels were treated with the Auqumantys. The anterior capsule was exposed and an inverted T capsulotomy was made. Fracture hematoma was encountered and evacuated. The patient was found to have a comminuted Left subcapital femoral neck fracture. I freshened the femoral neck cut with a saw. I removed the femoral neck fragment. A corkscrew was placed into the head and the head was removed. This was passed to the back table and was measured.  Acetabular exposure was achieved. I examined the articular cartilage which  was intact. The labrum was intact. A 47 mm trial head was placed and found to have excellent fit.  I then gained femoral exposure taking care to protect the abductors and greater trochanter. This was performed using standard external rotation, extension, and adduction. The capsule was peeled off the inner aspect of the greater trochanter, taking care to preserve the short external rotators. A cookie cutter was used to enter the femoral canal, and then the femoral canal finder was used to confirm location. I then sequentially broached up to a size 7. Calcar planer was used on the femoral neck remnant. I paced a hi neck and a 36+ 0 head ball.The hip was reduced. Leg lengths were checked fluoroscopically. The hip was dislocated and trial components were removed. I placed the real stem followed by the real spacer and head ball. A single reduction maneuver was performed and the hip was reduced. Fluoroscopy was used to confirm component position and leg lengths. At 90 degrees of external rotation and extension, the hip was stable to an anterior directed force.  The wound was copiously irrigated with normal saline solution. Marcaine solution was injected into the periarticular soft tissue. The wound was closed in layers using #1 Vicryl and V-Loc for the fascia, 2-0 Vicryl for the subcutaneous fat, 2-0 Monocryl for the deep dermal layer, 3-0 running Monocryl subcuticular stitch and glue for the skin. Once the glue was fully dried, an Aquacell Ag dressing was applied. The patient was then awakened from anesthesia and transported to the recovery room in stable condition. Sponge, needle, and instrument counts were correct at the end of the case x2. The patient tolerated the procedure well and there were no known complications.

## 2015-01-31 NOTE — ED Provider Notes (Signed)
CSN: [id number]     Arrival date & time 01/31/15  7654 History   First MD Initiated Contact with Patient 01/31/15 [phone]     Chief Complaint  Patient presents with  . Fall  . Leg Pain    left     (Consider location/radiation/quality/duration/timing/severity/associated sxs/prior Treatment) HPI  81 year old female presents with left hip pain after a fall last night. Patient was walking on a carpet and tripped over a chair. Landed on her left side and since then has been unable to bear weight. Husband helped her get into bed last night. Still having pain this morning, called EMS. They gave her 100 g fentanyl and she now has 0 out of 10 pain. No weakness or numbness. Did not hit her head and denies headache. No chest pain, dyspnea or dizziness.  Past Medical History  Diagnosis Date  . High cholesterol   . SBO (small bowel obstruction) ([place]) 08/2009    Early, partial SBO improved with conservative Tx   . Diverticulitis     Per chart   . Cancer ([place]) Around 2002-2003    Unclear history of "7 lb" abdominal tumor treated at [facility] ~2003 s/p open abdominal colonic resection and radiation  . Complication of anesthesia     "trouble waking up; too much med given" (01/28/2012)  . Arthritis     "probably" (01/28/2012)  . 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 Patient had surgery in 2000 and mass which was presumed to be a leiomyosarcoma rather than a spindle cell cancer. I have no further records of path reports to give me a good definite path diagnosis of her tumor resection.   . Constipation 02/03/2014  . Diverticulosis 02/03/2014  . Precordial pain 02/12/2012  . Constipation, chronic 02/06/2014   Past Surgical History  Procedure Laterality Date  . Cesarean section  1968  . Tumor removal  1990's;  X 2    "benign"  . Appendectomy  1968  . Tonsillectomy      "when I was a child"  . Colectomy  2003    "cancer"  . Abdominal hysterectomy  ~ 1985   ovaries resected B; DUB.   Family History  Problem Relation Age of Onset  . Heart failure Father 62    Heart failure  . Heart disease Father    Social History  Substance Use Topics  . Smoking status: Never Smoker   . Smokeless tobacco: Never Used  . Alcohol Use: 4.2 oz/week    7 Glasses of wine per week     Comment: social   OB History    No data available     Review of Systems  Respiratory: Negative for shortness of breath.   Cardiovascular: Negative for chest pain.  Gastrointestinal: Negative for vomiting.  Musculoskeletal: Positive for arthralgias.  Neurological: Negative for dizziness, syncope, weakness, numbness and headaches.  All other systems reviewed and are negative.     Allergies  Review of patient's allergies indicates no known allergies.  Home Medications   Prior to Admission medications   Medication Sig Start Date End Date Taking? Authorizing Provider  acetaminophen (TYLENOL) 325 MG tablet Take 325 mg by mouth every 6 (six) hours as needed for moderate pain.    Historical Provider, MD  Ascorbic Acid (VITAMIN C) 100 MG tablet Take 100 mg by mouth daily.    Historical Provider, MD  aspirin 81 MG chewable tablet Chew 81 mg by mouth once.  Historical Provider, MD  atorvastatin (LIPITOR) 10 MG tablet Take 1 tablet (10 mg total) by mouth daily at 6 PM. 01/19/15   Wardell Honour, MD  Multiple Vitamin (MULITIVITAMIN WITH MINERALS) TABS Take 1 tablet by mouth daily.    Historical Provider, MD  Multiple Vitamins-Minerals (MULTIVITAMIN WITH MINERALS) tablet Take 1 tablet by mouth daily.    Historical Provider, MD  Multiple Vitamins-Minerals (ZINC PO) Take 1 tablet by mouth daily.    Historical Provider, MD  Omega-3 Fatty Acids (OMEGA 3 PO) Take 1 capsule by mouth daily.    Historical Provider, MD  Simethicone (GAS-X PO) Take 1 tablet by mouth once as needed. As needed for gas.    Historical Provider, MD  VITAMIN E PO Take 1 tablet by mouth daily.    Historical  Provider, MD  zinc gluconate 50 MG tablet Take 50 mg by mouth daily.    Historical Provider, MD   BP 115/51 mmHg  Pulse 57  Temp(Src) 98.5 F (36.9 C) (Oral)  Ht 5\' 8"  (1.727 m)  Wt 115 lb (52.164 kg)  BMI 17.49 kg/m2  SpO2 96% Physical Exam  Constitutional: She is oriented to person, place, and time. She appears well-developed and well-nourished.  HENT:  Head: Normocephalic and atraumatic.  Right Ear: External ear normal.  Left Ear: External ear normal.  Nose: Nose normal.  Eyes: Right eye exhibits no discharge. Left eye exhibits no discharge.  Cardiovascular: Normal rate, regular rhythm and normal heart sounds.   Pulses:      Dorsalis pedis pulses are 2+ on the right side, and 2+ on the left side.  Pulmonary/Chest: Effort normal and breath sounds normal.  Abdominal: Soft. She exhibits no distension. There is no tenderness.  Musculoskeletal:       Left hip: She exhibits decreased range of motion and tenderness.       Left knee: She exhibits swelling. Tenderness found. LCL: mild.       Left upper leg: She exhibits no tenderness.       Left lower leg: She exhibits no tenderness.  Neurological: She is alert and oriented to person, place, and time.  Skin: Skin is warm and dry.  Nursing note and vitals reviewed.   ED Course  Procedures (including critical care time) Labs Review Labs Reviewed  BASIC METABOLIC PANEL - Abnormal; Notable for the following:    Glucose, Bld 135 (*)    All other components within normal limits  CBC WITH DIFFERENTIAL/PLATELET - Abnormal; Notable for the following:    Lymphs Abs 0.5 (*)    All other components within normal limits  CBG MONITORING, ED - Abnormal; Notable for the following:    Glucose-Capillary 122 (*)    All other components within normal limits  PROTIME-INR  URINALYSIS, ROUTINE W REFLEX MICROSCOPIC (NOT AT [facility])  TYPE AND SCREEN  ABO/RH    Imaging Review Dg Knee Complete 4 Views Left  01/31/2015   CLINICAL DATA:  Fall.  Knee  pain  EXAM: LEFT KNEE - COMPLETE 4+ VIEW  COMPARISON:  None.  FINDINGS: There is no evidence of fracture, dislocation, or joint effusion. There is no evidence of arthropathy or other focal bone abnormality. Soft tissues are unremarkable.  IMPRESSION: Negative.   Electronically Signed   By: Franchot Gallo M.D.   On: 01/31/2015 09:51   Dg Hip Unilat With Pelvis 2-3 Views Left  01/31/2015   CLINICAL DATA:  Patient tripped and fell over a chair while at home yesterday and injured  the left hip and left knee. Patient is unable to weightbear. Initial encounter.  EXAM: DG HIP (WITH OR WITHOUT PELVIS) 2-3V LEFT  COMPARISON:  None.  FINDINGS: Mildly impacted fracture involving the subcapital region of the left femoral neck. Hip joint anatomically aligned with moderate inferomedial joint space narrowing.  Included AP pelvis demonstrates an intact contralateral right hip joint with symmetric moderate inferomedial joint space narrowing. Sacroiliac joints and symphysis pubis intact with mild degenerative changes. Severe degenerative changes at the lumbosacral junction.  IMPRESSION: Acute traumatic mildly impacted subcapital left femoral neck fracture.   Electronically Signed   By: Evangeline Dakin M.D.   On: 01/31/2015 09:51   I have personally reviewed and evaluated these images and lab results as part of my medical decision-making.   EKG Interpretation   Date/Time:  Wednesday January 31 2015 08:53:10 EDT Ventricular Rate:  63 PR Interval:  158 QRS Duration: 90 QT Interval:  455 QTC Calculation: 466 R Axis:   1 Text Interpretation:  Sinus rhythm Borderline low voltage, extremity leads  No significant change since last tracing Confirmed by Colerain  ([id number]) on 01/31/2015 8:57:23 AM      MDM   Final diagnoses:  Subcapital fracture of hip, left, closed, initial encounter [facility])    Patient with a hip fracture, as above. NV intact. Initially pain free, now will give fentanyl prn for pain. Dw Dr.  Doran Durand of ortho who will arrange for ortho consult and surgery, unclear of exact time, will keep NPO. Admit to hospitalist.    Sherwood Gambler, MD 01/31/15 1043

## 2015-01-31 NOTE — ED Notes (Signed)
Ice Applied to the left upper leg/groin area and a blanket placed under heels to keep heels off of the bed as ordered.

## 2015-01-31 NOTE — ED Notes (Signed)
Pt transported to OR via stretcher by OR Tech.  Pt's personal belonging bag placed on stretcher.

## 2015-01-31 NOTE — ED Notes (Signed)
Called OR, spoke with Benjamine Mola, informed consent has not been signed.  Per Benjamine Mola, "give me 5 minutes to talk to somebody."  In pt room, [place], transport for OR informed.  He called Benjamine Mola, handed phone to this Probation officer, she stated "tell him to come on back.  Dr. Delfino Lovett has to talk to the patient or family first."

## 2015-01-31 NOTE — Consult Note (Signed)
ORTHOPAEDIC CONSULTATION  REQUESTING PHYSICIAN: Sherwood Gambler, MD  PCP:  [name], MD  Chief Complaint: left femoral neck fracture  HPI: Monica Ward is a 81 y.o. female who complains of  Left hip pain after a mechanical ground level fall earlier today at home. She has severe left hip pain and is unable to weight bear. Lives with her husband who has dementia. 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   Past Medical History  Diagnosis Date  . High cholesterol   . SBO (small bowel obstruction) ([place]) 08/2009    Early, partial SBO improved with conservative Tx   . Diverticulitis     Per chart   . Cancer ([place]) Around 2002-2003    Unclear history of "7 lb" abdominal tumor treated at [facility] ~2003 s/p open abdominal colonic resection and radiation  . Complication of anesthesia     "trouble waking up; too much med given" (01/28/2012)  . Arthritis     "probably" (01/28/2012)  . 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 Patient had surgery in 2000 and mass which was presumed to be a leiomyosarcoma rather than a spindle cell cancer. I have no further records of path reports to give me a good definite path diagnosis of her tumor resection.   . Constipation 02/03/2014  . Diverticulosis 02/03/2014  . Precordial pain 02/12/2012  . Constipation, chronic 02/06/2014   Past Surgical History  Procedure Laterality Date  . Cesarean section  1968  . Tumor removal  1990's;  X 2    "benign"  . Appendectomy  1968  . Tonsillectomy      "when I was a child"  . Colectomy  2003    "cancer"  . Abdominal hysterectomy  ~ 1985    ovaries resected B; DUB.   Social History   Social History  . Marital Status: Married    Spouse Name: N/A  . Number of Children: N/A  . Years of Education: N/A   Social History Main Topics  . Smoking status: Never Smoker   . Smokeless tobacco: Never Used  . Alcohol Use: 1.8 oz/week    3 Glasses of wine per  week     Comment: social  . Drug Use: No  . Sexual Activity: Not Currently   Other Topics Concern  . None   Social History Narrative      Marital status: married x 67 years; Husband with new onset dementia in 2016.      Children: 3 children in area; 4 grandchildren; no gg.      Lives: with husband in [place]. Goes to [place] every winter.      Employment: Retired      Tobacco: smoked briefly in college      Alcohol: 3 glasses of wine per week.      Exercise:  Very little.      ADLs: performs all ADLs; still drives.  No assistant devices.      Advanced Directives:  FULL CODE; no prolonged measures.         Family History  Problem Relation Age of Onset  . Heart failure Father 7    Heart failure  . Heart disease Father    No Known Allergies Prior to Admission medications   Medication Sig Start Date End Date Taking? Authorizing Provider  acetaminophen (TYLENOL) 500 MG tablet Take 1,000 mg by mouth every 6 (six) hours as needed for mild  pain, moderate pain, fever or headache.   Yes Historical Provider, MD  Ascorbic Acid (VITAMIN C) 100 MG tablet Take 100 mg by mouth daily.   Yes Historical Provider, MD  aspirin 81 MG chewable tablet Chew 81 mg by mouth daily.    Yes Historical Provider, MD  atorvastatin (LIPITOR) 10 MG tablet Take 1 tablet (10 mg total) by mouth daily at 6 PM. 01/19/15  Yes Wardell Honour, MD  Cyanocobalamin (VITAMIN B-12 PO) Take 1 tablet by mouth daily.   Yes Historical Provider, MD  Multiple Vitamins-Minerals (MULTIVITAMIN WITH MINERALS) tablet Take 1 tablet by mouth daily.   Yes Historical Provider, MD  Multiple Vitamins-Minerals (ZINC PO) Take 1 tablet by mouth daily.   Yes Historical Provider, MD  Omega-3 Fatty Acids (OMEGA 3 PO) Take 1 capsule by mouth daily.   Yes Historical Provider, MD  VITAMIN E PO Take 1 tablet by mouth daily.   Yes Historical Provider, MD   Dg Knee Complete 4 Views Left  01/31/2015   CLINICAL DATA:  Fall.  Knee pain  EXAM: LEFT KNEE -  COMPLETE 4+ VIEW  COMPARISON:  None.  FINDINGS: There is no evidence of fracture, dislocation, or joint effusion. There is no evidence of arthropathy or other focal bone abnormality. Soft tissues are unremarkable.  IMPRESSION: Negative.   Electronically Signed   By: Franchot Gallo M.D.   On: 01/31/2015 09:51   Dg Hip Unilat With Pelvis 2-3 Views Left  01/31/2015   CLINICAL DATA:  Patient tripped and fell over a chair while at home yesterday and injured the left hip and left knee. Patient is unable to weightbear. Initial encounter.  EXAM: DG HIP (WITH OR WITHOUT PELVIS) 2-3V LEFT  COMPARISON:  None.  FINDINGS: Mildly impacted fracture involving the subcapital region of the left femoral neck. Hip joint anatomically aligned with moderate inferomedial joint space narrowing.  Included AP pelvis demonstrates an intact contralateral right hip joint with symmetric moderate inferomedial joint space narrowing. Sacroiliac joints and symphysis pubis intact with mild degenerative changes. Severe degenerative changes at the lumbosacral junction.  IMPRESSION: Acute traumatic mildly impacted subcapital left femoral neck fracture.   Electronically Signed   By: Evangeline Dakin M.D.   On: 01/31/2015 09:51    Positive ROS: All other systems have been reviewed and were otherwise negative with the exception of those mentioned in the HPI and as above.  Physical Exam: General: Alert, no acute distress Cardiovascular: No pedal edema Respiratory: No cyanosis, no use of accessory musculature GI: No organomegaly, abdomen is soft and non-tender Skin: No lesions in the area of chief complaint Neurologic: Sensation intact distally Psychiatric: Patient is competent for consent with normal mood and affect Lymphatic: No axillary or cervical lymphadenopathy  MUSCULOSKELETAL: LLE shortened and externally rotated. Pain with logroll. NVI.  Assessment: Displaced left femoral neck fracture  Plan: Recommend left hip hemi vs THA to  allow her to mobilize Discussed R/B/A The risks, benefits, and alternatives were discussed with the patient. There are risks associated with the surgery including, but not limited to, problems with anesthesia (death), infection, instability (giving out of the joint), dislocation, differences in leg length/angulation/rotation, fracture of bones, loosening or failure of implants, hematoma (blood accumulation) which may require surgical drainage, blood clots, pulmonary embolism, nerve injury (foot drop and lateral thigh numbness), and blood vessel injury. The patient understands these risks and elects to proceed.     S[place], Horald Pollen, MD Cell [phone]    01/31/2015 3:21 PM

## 2015-01-31 NOTE — ED Notes (Signed)
Bed: WA03 Expected date: 01/31/15 Expected time:  Means of arrival: Ambulance Comments: Fall-81 yr old

## 2015-01-31 NOTE — Progress Notes (Signed)
Consult acknowledged. Full note to follow. Plan for L hip hemiarthroplasty vs THA later this afternoon/ evening. Keep NPO. Will obtain consent.

## 2015-01-31 NOTE — ED Notes (Signed)
Patient transported to X-ray 

## 2015-01-31 NOTE — Anesthesia Preprocedure Evaluation (Addendum)
Anesthesia Evaluation  Patient identified by MRN, date of birth, ID band Patient awake    Reviewed: Allergy & Precautions, NPO status , Patient's Chart, lab work & pertinent test results  History of Anesthesia Complications (+) history of anesthetic complications  Airway Mallampati: II  TM Distance: >3 FB Neck ROM: Full    Dental no notable dental hx.    Pulmonary neg pulmonary ROS,    Pulmonary exam normal breath sounds clear to auscultation       Cardiovascular Exercise Tolerance: Good negative cardio ROS Normal cardiovascular exam Rhythm:Regular Rate:Normal     Neuro/Psych negative neurological ROS  negative psych ROS   GI/Hepatic negative GI ROS, Neg liver ROS,   Endo/Other  negative endocrine ROS  Renal/GU negative Renal ROS  negative genitourinary   Musculoskeletal  (+) Arthritis ,   Abdominal   Peds negative pediatric ROS (+)  Hematology negative hematology ROS (+)   Anesthesia Other Findings   Reproductive/Obstetrics negative OB ROS                             Anesthesia Physical Anesthesia Plan  ASA: II  Anesthesia Plan: Spinal   Post-op Pain Management:    Induction: Intravenous  Airway Management Planned: Natural Airway  Additional Equipment:   Intra-op Plan:   Post-operative Plan:   Informed Consent: I have reviewed the patients History and Physical, chart, labs and discussed the procedure including the risks, benefits and alternatives for the proposed anesthesia with the patient or authorized representative who has indicated his/her understanding and acceptance.   Dental advisory given  Plan Discussed with: CRNA  Anesthesia Plan Comments: (Discussed risks and benefits of and differences between spinal and general. Discussed risks of spinal including headache, backache, failure, bleeding and hematoma, infection, and nerve damage and paralysis. Patient  consents to spinal. Questions answered. Coagulation studies and platelet count acceptable. )       Anesthesia Quick Evaluation

## 2015-01-31 NOTE — H&P (Addendum)
[facility] History and Physical  Monica Ward PZW:[id number] DOB: 01-04-1930 DOA: 01/31/2015  Referring physician:  Sherwood Gambler PCP:  [name], MD   Chief Complaint:  Fall with left hip pain  HPI:  The patient is a 81 y.o. year-old female with history of leiomyosarcoma status post excision with recurrent constipation, small bowel obstructions since her operation in 2003, high cholesterol, arthritis who presents with mechanical fall with resultant left hip pain.  The patient was last at their baseline health until yesterday.  She is independent for all ADLs and ambulates without assistive device. She can ambulate long distances without chest pain, shortness of breath. She still drives.  Yesterday evening, she tripped over a chair and landed on her left side and afterwards was unable to bear weight on her left leg. Her husband assisted her to bed but she had severe 10 out of 10 anterior left hip pain worse with movement, difficulty controlling her bladder and therefore came to the emergency department this morning. She denies any recent fevers, chills, URI symptoms, chest pain, shortness of breath, dysuria, changes to urinary habits until after her fall. She denies focal numbness or weakness of her bilateral lower extremities or perineal area. She has had difficulty controlling her bladder since her fall but states that she had some incontinence even before her fall. She has problems with chronic constipation and her last bowel movement was yesterday.    In the emergency department, her labs were within normal limits except for a mildly elevated glucose at 135. Her vital signs were notable for mild bradycardia to 57, stable blood pressure.  Left knee x-rays were negative for fracture but her hip/pelvis x-ray demonstrated an acute traumatic mildly impacted subcapital left femoral neck fracture. She was given fentanyl which relieved her pain and orthopedics has been consulted.  Dr. Doran Durand  is consulting one of his colleagues who specializes in this type of fracture and timing of surgery is pending.    Review of Systems:  General:  Denies fevers, chills, weight loss or gain HEENT:  Denies changes to hearing and vision, rhinorrhea, sinus congestion, sore throat CV:  Denies chest pain and palpitations, lower extremity edema.  PULM:  Denies SOB, wheezing, cough.   GI:  Denies nausea, vomiting, constipation, diarrhea.   GU:  Denies dysuria, chronic urinary incontinence and wears a pad all the time.  Chronic frequency, urgency ENDO:  Denies polyuria, polydipsia.   HEME:  Denies hematemesis, blood in stools, melena, abnormal bruising or bleeding.  LYMPH:  Denies lymphadenopathy.   MSK:  Denies arthralgias, myalgias.   DERM:  Denies skin rash or ulcer.   NEURO:  Denies focal numbness, weakness, slurred speech, confusion, facial droop.  PSYCH:  Denies anxiety and depression.    Past Medical History  Diagnosis Date  . High cholesterol   . SBO (small bowel obstruction) ([place]) 08/2009    Early, partial SBO improved with conservative Tx   . Diverticulitis     Per chart   . Cancer ([place]) Around 2002-2003    Unclear history of "7 lb" abdominal tumor treated at [facility] ~2003 s/p open abdominal colonic resection and radiation  . Complication of anesthesia     "trouble waking up; too much med given" (01/28/2012)  . Arthritis     "probably" (01/28/2012)  . Leiomyosarcoma ([place]) 10/28/2011    Patient had surgery for a spindle cell tumor in 1999. Patient had surgery in 2000 and mass which was presumed to be a leiomyosarcoma  rather than a spindle cell cancer. I have no further records of path reports to give me a good definite path diagnosis of her tumor resection.   . Constipation 02/03/2014  . Diverticulosis 02/03/2014  . Precordial pain 02/12/2012  . Constipation, chronic 02/06/2014   Past Surgical History  Procedure Laterality Date  . Cesarean section  1968  . Tumor removal  1990's;  X 2     "benign"  . Appendectomy  1968  . Tonsillectomy      "when I was a child"  . Colectomy  2003    "cancer"  . Abdominal hysterectomy  ~ 1985    ovaries resected B; DUB.   Social History:  reports that she has never smoked. She has never used smokeless tobacco. She reports that she drinks about 1.8 oz of alcohol per week. She reports that she does not use illicit drugs.  No Known Allergies  Family History  Problem Relation Age of Onset  . Heart failure Father 42    Heart failure  . Heart disease Father      Prior to Admission medications   Medication Sig Start Date End Date Taking? Authorizing Provider  acetaminophen (TYLENOL) 500 MG tablet Take 1,000 mg by mouth every 6 (six) hours as needed for mild pain, moderate pain, fever or headache.   Yes Historical Provider, MD  Ascorbic Acid (VITAMIN C) 100 MG tablet Take 100 mg by mouth daily.   Yes Historical Provider, MD  aspirin 81 MG chewable tablet Chew 81 mg by mouth daily.    Yes Historical Provider, MD  atorvastatin (LIPITOR) 10 MG tablet Take 1 tablet (10 mg total) by mouth daily at 6 PM. 01/19/15  Yes Wardell Honour, MD  Cyanocobalamin (VITAMIN B-12 PO) Take 1 tablet by mouth daily.   Yes Historical Provider, MD  Multiple Vitamins-Minerals (MULTIVITAMIN WITH MINERALS) tablet Take 1 tablet by mouth daily.   Yes Historical Provider, MD  Multiple Vitamins-Minerals (ZINC PO) Take 1 tablet by mouth daily.   Yes Historical Provider, MD  Omega-3 Fatty Acids (OMEGA 3 PO) Take 1 capsule by mouth daily.   Yes Historical Provider, MD  VITAMIN E PO Take 1 tablet by mouth daily.   Yes Historical Provider, MD   Physical Exam: Filed Vitals:   01/31/15 0857 01/31/15 1105  BP: 115/51 110/56  Pulse: 57 61  Temp: 98.5 F (36.9 C) 98.1 F (36.7 C)  TempSrc: Oral Oral  Resp:  20  Height: 5\' 8"  (1.727 m)   Weight: 52.164 kg (115 lb)   SpO2: 96% 94%     General:  Thin adult female, no acute distress  Eyes:  PERRL, anicteric,  non-injected.  ENT:  Nares clear.  OP clear, non-erythematous without plaques or exudates.  MMM.  Neck:  Supple without TM or JVD.    Lymph:  No cervical, supraclavicular, or submandibular LAD.  Cardiovascular:  RRR, normal S1, S2, without m/r/g.  2+ right pedal, 1+ left pedal pulse, warm extremities  Respiratory:  CTA bilaterally without increased WOB.  Abdomen:  NABS.  Soft, ND/NT.    Skin:  No rashes or focal lesions.  Musculoskeletal:  Normal bulk and tone.  No LE edema.  Psychiatric:  A & O x 4.  Appropriate affect.  Neurologic:  CN 3-12 intact.  5/5 strength.  Sensation intact.  Labs on Admission:  Basic Metabolic Panel:  Recent Labs Lab 01/31/15 0915  NA 139  K 3.6  CL 106  CO2 27  GLUCOSE  135*  BUN 16  CREATININE 0.52  CALCIUM 8.9   Liver Function Tests: No results for input(s): AST, ALT, ALKPHOS, BILITOT, PROT, ALBUMIN in the last 168 hours. No results for input(s): LIPASE, AMYLASE in the last 168 hours. No results for input(s): AMMONIA in the last 168 hours. CBC:  Recent Labs Lab 01/31/15 0915  WBC 8.3  NEUTROABS 7.3  HGB 12.8  HCT 39.1  MCV 93.8  PLT 171   Cardiac Enzymes: No results for input(s): CKTOTAL, CKMB, CKMBINDEX, TROPONINI in the last 168 hours.  BNP (last 3 results) No results for input(s): BNP in the last 8760 hours.  ProBNP (last 3 results) No results for input(s): PROBNP in the last 8760 hours.  CBG:  Recent Labs Lab 01/31/15 0857  GLUCAP 122*    Radiological Exams on Admission: Dg Knee Complete 4 Views Left  01/31/2015   CLINICAL DATA:  Fall.  Knee pain  EXAM: LEFT KNEE - COMPLETE 4+ VIEW  COMPARISON:  None.  FINDINGS: There is no evidence of fracture, dislocation, or joint effusion. There is no evidence of arthropathy or other focal bone abnormality. Soft tissues are unremarkable.  IMPRESSION: Negative.   Electronically Signed   By: Franchot Gallo M.D.   On: 01/31/2015 09:51   Dg Hip Unilat With Pelvis 2-3 Views  Left  01/31/2015   CLINICAL DATA:  Patient tripped and fell over a chair while at home yesterday and injured the left hip and left knee. Patient is unable to weightbear. Initial encounter.  EXAM: DG HIP (WITH OR WITHOUT PELVIS) 2-3V LEFT  COMPARISON:  None.  FINDINGS: Mildly impacted fracture involving the subcapital region of the left femoral neck. Hip joint anatomically aligned with moderate inferomedial joint space narrowing.  Included AP pelvis demonstrates an intact contralateral right hip joint with symmetric moderate inferomedial joint space narrowing. Sacroiliac joints and symphysis pubis intact with mild degenerative changes. Severe degenerative changes at the lumbosacral junction.  IMPRESSION: Acute traumatic mildly impacted subcapital left femoral neck fracture.   Electronically Signed   By: Evangeline Dakin M.D.   On: 01/31/2015 09:51    EKG: Independently reviewed. Normal sinus rhythm  Assessment/Plan Principal Problem:   Subcapital fracture of left hip (HCC) Active Problems:   Constipation, chronic   High cholesterol  ---  Subcapital fracture of the left hip -  SCDs -  Nonweight bearing pending surgical evaluation -  Place Foley -  Vicodin as needed for pain  -  Patient high risk for bowel complication such as constipation or even bowel obstruction, so start Colace, senna, MiraLAX  -  Awaiting orthopedic surgery decision regarding timing and possibility of surgery  -  Hold aspirin pending surgery -  Patient low risk for surgery  Hyperlipidemia, stable, continue atorvastatin  Chronic constipation, see above  Mild hyperglycemia, check hemoglobin A1c  Diet:  Nothing by mouth Access:  PIV IVF:  yes Proph:  SCDs  Code Status: full Family Communication: patient alone Disposition Plan: Admit to med-surg  Time spent: 60 min SHORT, MACKENZIE [facility] Pager [phone]  If 7PM-7AM, please contact night-coverage [email or web address] Password [email or web address] 01/31/2015, 11:20  AM

## 2015-01-31 NOTE — ED Notes (Signed)
Per [facility], pt fell last night around 2100, tripped over a chair and fell onto a carpeted surface, was able to get into bed but unable to bear weight.  Denies head/neck/back or LOC.  Pt has leg shortening.  Received a total of fentanyl 100 mcg and zofran 4 mg.

## 2015-01-31 NOTE — Discharge Instructions (Signed)
°Dr. Panzy Bubeck °Joint Replacement Specialist °[facility] °[address] °[place], [place] [address] °[phone] ° ° °TOTAL HIP REPLACEMENT POSTOPERATIVE DIRECTIONS ° ° ° °Hip Rehabilitation, Guidelines Following Surgery  ° °WEIGHT BEARING °Weight bearing as tolerated with assist device (walker, cane, etc) as directed, use it as long as suggested by your surgeon or therapist, typically at least 4-6 weeks. ° °The results of a hip operation are greatly improved after range of motion and muscle strengthening exercises. Follow all safety measures which are given to protect your hip. If any of these exercises cause increased pain or swelling in your joint, decrease the amount until you are comfortable again. Then slowly increase the exercises. Call your caregiver if you have problems or questions.  ° °HOME CARE INSTRUCTIONS  °Most of the following instructions are designed to prevent the dislocation of your new hip.  °Remove items at home which could result in a fall. This includes throw rugs or furniture in walking pathways.  °Continue medications as instructed at time of discharge. °· You may have some home medications which will be placed on hold until you complete the course of blood thinner medication. °· You may start showering once you are discharged home. Do not remove your dressing. °Do not put on socks or shoes without following the instructions of your caregivers.   °Sit on chairs with arms. Use the chair arms to help push yourself up when arising.  °Arrange for the use of a toilet seat elevator so you are not sitting low.  °· Walk with walker as instructed.  °You may resume a sexual relationship in one month or when given the OK by your caregiver.  °Use walker as long as suggested by your caregivers.  °You may put full weight on your legs and walk as much as is comfortable. °Avoid periods of inactivity such as sitting longer than an hour when not asleep. This helps prevent  blood clots.  °You may return to work once you are cleared by your surgeon.  °Do not drive a car for 6 weeks or until released by your surgeon.  °Do not drive while taking narcotics.  °Wear elastic stockings for two weeks following surgery during the day but you may remove then at night.  °Make sure you keep all of your appointments after your operation with all of your doctors and caregivers. You should call the office at the above phone number and make an appointment for approximately two weeks after the date of your surgery. °Please pick up a stool softener and laxative for home use as long as you are requiring pain medications. °· ICE to the affected hip every three hours for 30 minutes at a time and then as needed for pain and swelling. Continue to use ice on the hip for pain and swelling from surgery. You may notice swelling that will progress down to the foot and ankle.  This is normal after surgery.  Elevate the leg when you are not up walking on it.   °It is important for you to complete the blood thinner medication as prescribed by your doctor. °· Continue to use the breathing machine which will help keep your temperature down.  It is common for your temperature to cycle up and down following surgery, especially at night when you are not up moving around and exerting yourself.  The breathing machine keeps your lungs expanded and your temperature down. ° °RANGE OF MOTION AND STRENGTHENING EXERCISES  °These exercises are   designed to help you keep full movement of your hip joint. Follow your caregiver's or physical therapist's instructions. Perform all exercises about fifteen times, three times per day or as directed. Exercise both hips, even if you have had only one joint replacement. These exercises can be done on a training (exercise) mat, on the floor, on a table or on a bed. Use whatever works the best and is most comfortable for you. Use music or television while you are exercising so that the exercises  are a pleasant break in your day. This will make your life better with the exercises acting as a break in routine you can look forward to.  °Lying on your back, slowly slide your foot toward your buttocks, raising your knee up off the floor. Then slowly slide your foot back down until your leg is straight again.  °Lying on your back spread your legs as far apart as you can without causing discomfort.  °Lying on your side, raise your upper leg and foot straight up from the floor as far as is comfortable. Slowly lower the leg and repeat.  °Lying on your back, tighten up the muscle in the front of your thigh (quadriceps muscles). You can do this by keeping your leg straight and trying to raise your heel off the floor. This helps strengthen the largest muscle supporting your knee.  °Lying on your back, tighten up the muscles of your buttocks both with the legs straight and with the knee bent at a comfortable angle while keeping your heel on the floor.  ° °SKILLED REHAB INSTRUCTIONS: °If the patient is transferred to a skilled rehab facility following release from the hospital, a list of the current medications will be sent to the facility for the patient to continue.  When discharged from the skilled rehab facility, please have the facility set up the patient's Home Health Physical Therapy prior to being released. Also, the skilled facility will be responsible for providing the patient with their medications at time of release from the facility to include their pain medication and their blood thinner medication. If the patient is still at the rehab facility at time of the two week follow up appointment, the skilled rehab facility will also need to assist the patient in arranging follow up appointment in our office and any transportation needs. ° °MAKE SURE YOU:  °Understand these instructions.  °Will watch your condition.  °Will get help right away if you are not doing well or get worse. ° °Pick up stool softner and  laxative for home use following surgery while on pain medications. °Do not remove your dressing. °The dressing is waterproof--it is OK to take showers. °Continue to use ice for pain and swelling after surgery. °Do not use any lotions or creams on the incision until instructed by your surgeon. °Total Hip Protocol. ° ° °

## 2015-02-01 ENCOUNTER — Encounter (HOSPITAL_COMMUNITY): Payer: Self-pay | Admitting: Orthopedic Surgery

## 2015-02-01 LAB — CBC
HCT: 33.2 % — ABNORMAL LOW (ref 36.0–46.0)
Hemoglobin: 10.9 g/dL — ABNORMAL LOW (ref 12.0–15.0)
MCH: 31.1 pg (ref 26.0–34.0)
MCHC: 32.8 g/dL (ref 30.0–36.0)
MCV: 94.6 fL (ref 78.0–100.0)
Platelets: 152 10*3/uL (ref 150–400)
RBC: 3.51 MIL/uL — ABNORMAL LOW (ref 3.87–5.11)
RDW: 13.7 % (ref 11.5–15.5)
WBC: 7.1 10*3/uL (ref 4.0–10.5)

## 2015-02-01 LAB — BASIC METABOLIC PANEL
Anion gap: 4 — ABNORMAL LOW (ref 5–15)
BUN: 13 mg/dL (ref 6–20)
CO2: 25 mmol/L (ref 22–32)
Calcium: 8.1 mg/dL — ABNORMAL LOW (ref 8.9–10.3)
Chloride: 107 mmol/L (ref 101–111)
Creatinine, Ser: 0.49 mg/dL (ref 0.44–1.00)
GFR calc Af Amer: >60 mL/min (ref >60)
GFR calc non Af Amer: >60 mL/min (ref >60)
Glucose, Bld: 131 mg/dL — ABNORMAL HIGH (ref 65–99)
Potassium: 3.6 mmol/L (ref 3.5–5.1)
Sodium: 136 mmol/L (ref 135–145)

## 2015-02-01 LAB — URINE CULTURE

## 2015-02-01 NOTE — Anesthesia Postprocedure Evaluation (Signed)
  Anesthesia Post-op Note  Patient: Monica Ward  Procedure(s) Performed: Procedure(s) (LRB):  ANTERIOR APPROACH  LEFT HEMI ARTHROPLASTY (Left)  Patient Location: PACU  Anesthesia Type: Spinal  Level of Consciousness: awake and alert   Airway and Oxygen Therapy: Patient Spontanous Breathing  Post-op Pain: mild  Post-op Assessment: Post-op Vital signs reviewed, Patient's Cardiovascular Status Stable, Respiratory Function Stable, Patent Airway and No signs of Nausea or vomiting. Purposeful movement of both legs in PACU. Normal resolution of spinal.  Last Vitals:  Filed Vitals:   02/01/15 0950  BP: 128/58  Pulse: 64  Temp: 36.5 C  Resp: 18    Post-op Vital Signs: stable   Complications: No apparent anesthesia complications

## 2015-02-01 NOTE — Evaluation (Signed)
Occupational Therapy Evaluation Patient Details Name: Monica Ward MRN: [id number] DOB: April 19, 1930 Today's Date: 02/01/2015    History of Present Illness Left displaced femoral neck fracture after mechanical fall. , s/p  direct anterior hemiarthroplasty on 01/01/15   Clinical Impression   Patient presenting with deconditioning and decreased independence secondary to above. Patient independent PTA. Patient currently requires min guard/min assist for functional mobility/transfers using RW and up to mod assist for LB ADLs. Patient will benefit from acute OT to increase overall independence in the areas of ADLs, functional mobility, and overall safety in order to safely discharge home with [facility] vs [facility] SNF.     Follow Up Recommendations  Home health OT;Supervision/Assistance - 24 hour (IF pt does not have 24/7 supervision [facility] SNF would be best/safest option)    Equipment Recommendations  3 in 1 bedside comode    Recommendations for Other Services  None at this time   Precautions / Restrictions Precautions Precautions: Fall Restrictions Weight Bearing Restrictions: Yes LLE Weight Bearing: Weight bearing as tolerated    Mobility Bed Mobility Overal bed mobility: Needs Assistance Bed Mobility: Supine to Sit     Supine to sit: Min guard     General bed mobility comments: cues for technique  Transfers Overall transfer level: Needs assistance Equipment used: Rolling walker (2 wheeled) Transfers: Sit to/from Stand Sit to Stand: Min assist;Min guard General transfer comment: cues for hand and L leg position.    Balance Overall balance assessment: Needs assistance Sitting-balance support: No upper extremity supported;Feet supported Sitting balance-Leahy Scale: Good     Standing balance support: Bilateral upper extremity supported;During functional activity Standing balance-Leahy Scale: Fair    ADL Overall ADL's : Needs assistance/impaired Eating/Feeding: Set up;Sitting    Grooming: Set up;Sitting   Upper Body Bathing: Set up;Sitting   Lower Body Bathing: Moderate assistance;Sit to/from stand;Cueing for safety   Upper Body Dressing : Set up;Sitting   Lower Body Dressing: Moderate assistance;Sit to/from stand;Cueing for safety   Toilet Transfer: Min guard;RW;BSC;Ambulation     Toileting - Clothing Manipulation Details (indicate cue type and reason): did not occur   Tub/Shower Transfer Details (indicate cue type and reason): did not occur  Functional mobility during ADLs: Min guard General ADL Comments: Pt found supine in bed. Pt engaged in bed mobility, stood from EOB, and ambulated into BR. Pt then ambulated back to room and transferred into recliner. Pt unable to reach BLEs, pt may benefit from use of AE. Pt reports her husband has early dementia and their daughter is currently staying with them. Pt will need 24/7 supervision/assistance OR SNF.     Pertinent Vitals/Pain Pain Assessment: No/denies pain (prior, during, post activity/therapy session)     Hand Dominance Right   Extremity/Trunk Assessment Upper Extremity Assessment Upper Extremity Assessment: Overall WFL for tasks assessed   Lower Extremity Assessment Lower Extremity Assessment: Defer to PT evaluation LLE Deficits / Details: able to  move leg to edge and bear weight   Cervical / Trunk Assessment Cervical / Trunk Assessment: Normal   Communication Communication Communication: No difficulties   Cognition Arousal/Alertness: Awake/alert Behavior During Therapy: WFL for tasks assessed/performed Overall Cognitive Status: Within Functional Limits for tasks assessed              Home Living Family/patient expects to be discharged to:: Private residence Living Arrangements: Spouse/significant other   Type of Home: Apartment (Condo) Home Access: Level entry     Home Layout: One level     Bathroom Shower/Tub:  Tub/shower unit;Curtain   Bathroom Toilet: Standard     Home  Equipment: None   Additional Comments: spouse with early stages of dementia per patient, patient states that daughter staying at home with spouse.      Prior Functioning/Environment Level of Independence: Independent     OT Diagnosis: Generalized weakness   OT Problem List: Decreased range of motion;Decreased activity tolerance;Impaired balance (sitting and/or standing);Decreased safety awareness;Decreased knowledge of use of DME or AE;Decreased knowledge of precautions;Pain   OT Treatment/Interventions: Therapeutic exercise;Self-care/ADL training;Energy conservation;DME and/or AE instruction;Therapeutic activities;Patient/family education;Balance training    OT Goals(Current goals can be found in the care plan section) Acute Rehab OT Goals Patient Stated Goal: to go home OT Goal Formulation: With patient Time For Goal Achievement: 02/15/15 Potential to Achieve Goals: Good ADL Goals Pt Will Perform Grooming: with modified independence;standing Pt Will Perform Lower Body Bathing: with supervision;sit to/from stand;with adaptive equipment (AE prn) Pt Will Perform Lower Body Dressing: sit to/from stand;with adaptive equipment;with supervision (AE prn) Pt Will Transfer to Toilet: with modified independence;ambulating;bedside commode Pt Will Perform Tub/Shower Transfer: Tub transfer;ambulating;rolling walker;3 in 1;with supervision Additional ADL Goal #1: Pt will be mod I with functional mobility using RW prn  OT Frequency: Min 2X/week   Barriers to D/C: Unsure, possibly decreased caregiver support       Co-evaluation PT/OT/SLP Co-Evaluation/Treatment: Yes Reason for Co-Treatment: For patient/therapist safety PT goals addressed during session: Mobility/safety with mobility OT goals addressed during session: ADL's and self-care      End of Session Equipment Utilized During Treatment: Rolling walker  Activity Tolerance: Patient tolerated treatment well Patient left: in chair;with  call bell/phone within reach;with chair alarm set   Time: 1010-1030 OT Time Calculation (min): 20 min Charges:  OT General Charges $OT Visit: 1 Procedure OT Evaluation $Initial OT Evaluation Tier I: 1 Procedure  CLAY,PATRICIA , MS, OTR/L, CLT Pager: [phone]  02/01/2015, 11:53 AM

## 2015-02-01 NOTE — Progress Notes (Signed)
[facility] PROGRESS NOTE  Monica Ward KGU:[id number] DOB: 1929-07-24 DOA: 01/31/2015 PCP: [name], MD  Brief Summary  The patient is a 81 y.o. year-old female with history of leiomyosarcoma status post excision with recurrent constipation, small bowel obstructions since her operation in 2003, high cholesterol, arthritis who presents with mechanical fall with resultant left hip pain. Hip/pelvis x-ray demonstrated an acute traumatic mildly impacted subcapital left femoral neck fracture.    10/5:  Left hip hemiarthroplasty, anterior approach by Dr. Dolly Rias  Assessment/Plan  Subcapital fracture of the left hip - Appreciate orthopedics assistance - aspirin and SCDs for DVT prophylaxis -  D/c foley - Vicodin as needed for pain  -  PT/OT recommending probable home with [facility] services  Hyperlipidemia, stable, continue atorvastatin  Chronic constipation,  - Continue aggressive bowel regimen with Colace, senna, MiraLAX   Mild hyperglycemia, check hemoglobin A1c  Diet:  regular Access:  PIV IVF:  yes Proph:  SCDs  Code Status: full Family Communication: patient alone Disposition Plan: possibly home tomorrow depending on progression    Consultants:  Orthopedic surgery, Dr. [name]  Procedures: 10/5:  Left hip hemiarthroplasty, anterior approach by Dr. Dolly Rias  Antibiotics:  none   HPI/Subjective:  States that her leg is not hurting at all this morning and that she last received pain medication around 5AM.  Feels cold.  Eating some and denies problems with breathing.      Objective: Filed Vitals:   02/01/15 0115 02/01/15 0300 02/01/15 0950 02/01/15 1407  BP: 112/39 108/45 128/58 112/65  Pulse: 66 68 64 63  Temp: 98.6 F (37 C) 98.5 F (36.9 C) 97.7 F (36.5 C) 97.6 F (36.4 C)  TempSrc: Oral Oral Oral Oral  Resp: 16 16 18 16   Height:      Weight:      SpO2: 100% 98% 100% 100%    Intake/Output Summary (Last 24 hours) at  02/01/15 1710 Last data filed at 02/01/15 1615  Gross per 24 hour  Intake   2660 ml  Output   1625 ml  Net   1035 ml   Filed Weights   01/31/15 0857  Weight: 52.164 kg (115 lb)   Body mass index is 17.49 kg/(m^2).  Exam:   General:  Thin F, No acute distress  HEENT:  NCAT, MMM  Cardiovascular:  RRR, nl S1, S2 no mrg, 2+ pulses, warm extremities  Respiratory:  CTAB, no increased WOB  Abdomen:   NABS, soft, NT/ND  MSK:   Normal tone and bulk, left anterior hip dressing wtihout blood, no surrounding swelling, ecchymosis or erythema.  Pulse palpable with < 2 sec CR.  Sensation intact to light touch left foot  Neuro:  Grossly intact  Data Reviewed: Basic Metabolic Panel:  Recent Labs Lab 01/31/15 0915 02/01/15 0444  NA 139 136  K 3.6 3.6  CL 106 107  CO2 27 25  GLUCOSE 135* 131*  BUN 16 13  CREATININE 0.52 0.49  CALCIUM 8.9 8.1*   Liver Function Tests: No results for input(s): AST, ALT, ALKPHOS, BILITOT, PROT, ALBUMIN in the last 168 hours. No results for input(s): LIPASE, AMYLASE in the last 168 hours. No results for input(s): AMMONIA in the last 168 hours. CBC:  Recent Labs Lab 01/31/15 0915 02/01/15 0444  WBC 8.3 7.1  NEUTROABS 7.3  --   HGB 12.8 10.9*  HCT 39.1 33.2*  MCV 93.8 94.6  PLT 171 152    Recent Results (from the past 240 hour(s))  Culture, Urine     Status: None   Collection Time: 01/31/15 11:40 AM  Result Value Ref Range Status   Specimen Description URINE, RANDOM  Final   Special Requests NONE  Final   Culture   Final    MULTIPLE SPECIES PRESENT, SUGGEST RECOLLECTION Performed at [facility]    Report Status 02/01/2015 FINAL  Final     Studies: Pelvis Portable  01/31/2015   CLINICAL DATA:  Postop left hip surgery.  EXAM: PORTABLE PELVIS 1-2 VIEWS  COMPARISON:  None.  FINDINGS: A single spot AP radiograph of the pelvis is provided for review.  Post left bipolar hip replacement. Alignment appears anatomic given solitary  AP projection. Expected adjacent subcutaneous emphysema. No definite fracture. Multiple surgical clips overlie the left hemipelvis peer no radiopaque foreign body.  IMPRESSION: Post left bipolar hip replacement without evidence of complication.   Electronically Signed   By: Sandi Mariscal M.D.   On: 01/31/2015 19:35   Dg Knee Complete 4 Views Left  01/31/2015   CLINICAL DATA:  Fall.  Knee pain  EXAM: LEFT KNEE - COMPLETE 4+ VIEW  COMPARISON:  None.  FINDINGS: There is no evidence of fracture, dislocation, or joint effusion. There is no evidence of arthropathy or other focal bone abnormality. Soft tissues are unremarkable.  IMPRESSION: Negative.   Electronically Signed   By: Franchot Gallo M.D.   On: 01/31/2015 09:51   Dg C-arm 1-60 Min-no Report  01/31/2015   CLINICAL DATA: hip fracture   C-ARM 1-60 MINUTES  Fluoroscopy was utilized by the requesting physician.  No radiographic  interpretation.    Dg Hip Operative Unilat With Pelvis Left  01/31/2015   CLINICAL DATA:  Left hip fracture.  EXAM: DG C-ARM 1-60 MIN-NO REPORT; OPERATIVE LEFT HIP WITH PELVIS  FLUOROSCOPY TIME:  21 seconds  COMPARISON:  Left hip radiographs - 01/31/2015  FINDINGS: 2 spot anterior projection fluoroscopic images of the left hip and lower pelvis are provided for review.  Images demonstrate the sequela of left bipolar hip replacement. Alignment appears anatomic given solitary anterior projection. No definite fracture. Expected subcutaneous emphysema about the operative site. Multiple surgical clips overlie the left lower pelvis. No radiopaque foreign body.  IMPRESSION: Post left bipolar hip replacement without evidence of complication.   Electronically Signed   By: Sandi Mariscal M.D.   On: 01/31/2015 17:43   Dg Hip Unilat With Pelvis 2-3 Views Left  01/31/2015   CLINICAL DATA:  Patient tripped and fell over a chair while at home yesterday and injured the left hip and left knee. Patient is unable to weightbear. Initial encounter.  EXAM:  DG HIP (WITH OR WITHOUT PELVIS) 2-3V LEFT  COMPARISON:  None.  FINDINGS: Mildly impacted fracture involving the subcapital region of the left femoral neck. Hip joint anatomically aligned with moderate inferomedial joint space narrowing.  Included AP pelvis demonstrates an intact contralateral right hip joint with symmetric moderate inferomedial joint space narrowing. Sacroiliac joints and symphysis pubis intact with mild degenerative changes. Severe degenerative changes at the lumbosacral junction.  IMPRESSION: Acute traumatic mildly impacted subcapital left femoral neck fracture.   Electronically Signed   By: Evangeline Dakin M.D.   On: 01/31/2015 09:51    Scheduled Meds: . aspirin EC  325 mg Oral BID PC  . atorvastatin  10 mg Oral q1800  . docusate sodium  100 mg Oral BID  . multivitamin with minerals  1 tablet Oral Daily  . polyethylene glycol  17 g Oral  BID  . senna  1 tablet Oral BID   Continuous Infusions: . sodium chloride 1,000 mL (01/31/15 1900)  . dextrose 5 % and 0.45 % NaCl with KCl 20 mEq/L 100 mL/hr at 01/31/15 2131    Principal Problem:   Subcapital fracture of left hip (HCC) Active Problems:   Constipation, chronic   High cholesterol   Left displaced femoral neck fracture ([place])    Time spent: 30 min    SHORT, [facility]  [facility] Pager [phone]. If 7PM-7AM, please contact night-coverage at [email or web address], password [facility] 02/01/2015, 5:10 PM  LOS: 1 day

## 2015-02-01 NOTE — Clinical Social Work Note (Signed)
Clinical Social Work Assessment  Patient Details  Name: Monica Ward MRN: [id number] Date of Birth: July 09, 1929  Date of referral:  02/01/15               Reason for consult:  Discharge Planning                Permission sought to share information with:    Permission granted to share information::     Name::        Agency::     Relationship::     Contact Information:     Housing/Transportation Living arrangements for the past 2 months:  Single Family Home Source of Information:  Patient Patient Interpreter Needed:  None Criminal Activity/Legal Involvement Pertinent to Current Situation/Hospitalization:  No - Comment as needed Significant Relationships:  Adult Children, Spouse Lives with:  Spouse Do you feel safe going back to the place where you live?  Yes Need for family participation in patient care:  Yes (Comment)  Care giving concerns:  Pt reports no concerns at this time. No family at bedside.   Social Worker assessment / plan: Pt hospitalized on 01/31/15 with a subcapital fx of left hip. Surgery completed on 10/5. PT has recommended SNF placement at d/c. CSW met with pt to assist with d/c planning. Pt has declined [facility] Rehab at this time. Pt is requesting [place] services at d/c. RNCM is aware and will assist with d/c planning. CSW will remain available in case d/c plan changes.  Employment status:  Retired Nurse, adult PT Recommendations:  [place] / Referral to community resources:     Patient/Family's Response to care:  Pt is declining ST Rehab. No family at bedside.  Patient/Family's Understanding of and Emotional Response to Diagnosis, Current Treatment, and Prognosis:  Pt is aware of her medical status and PT recommendations. CSW has encouraged pt to discuss plans with her spouse / daughter . RNCM will meet with pt to assist with [facility] services. Pt is aware CSW is available to assist with [facility] if pt changes her  mind and accepts placement.  Emotional Assessment Appearance:  Appears stated age Attitude/Demeanor/Rapport:  Other (cooperative) Affect (typically observed):  Calm, Pleasant Orientation:  Oriented to Self, Oriented to Place, Oriented to  Time, Oriented to Situation Alcohol / Substance use:  Not Applicable Psych involvement (Current and /or in the community):  No (Comment)  Discharge Needs  Concerns to be addressed:  Discharge Planning Concerns Readmission within the last 30 days:  No Current discharge risk:  None Barriers to Discharge:  No Barriers Identified   Haidinger, Randall An, LCSW 02/01/2015, 1:48 PM

## 2015-02-01 NOTE — Progress Notes (Signed)
Patient is missing prescription eye glasses. Came through ED on 01/31/15 and went to surgery in the afternoon. It is documented that eyeglasses were at the bedside at 13:30. PACU states they were not with her when she arrived there. Safety Zone was filed. Call placed to ED who will look around the ED for them.

## 2015-02-01 NOTE — Progress Notes (Signed)
Physical Therapy Treatment Patient Details Name: Monica Ward MRN: [id number] DOB: 10-01-1929 Today's Date: 02/01/2015    History of Present Illness Left displaced femoral neck fracture after mechanical fall. , s/p  direct anterior hemiarthroplasty on 01/01/15    PT Comments    Daughter present, states that she will be available for a few days to assist. NOt certain if  Other caregivers will be  Available. Patient progressing well.  Follow Up Recommendations  Home health PT;Supervision/Assistance - 24 hour     Equipment Recommendations  Rolling walker with 5" wheels    Recommendations for Other Services       Precautions / Restrictions Precautions Precautions: Fall Restrictions LLE Weight Bearing: Weight bearing as tolerated    Mobility  Bed Mobility                  Transfers Overall transfer level: Needs assistance Equipment used: Rolling walker (2 wheeled) Transfers: Sit to/from Stand Sit to Stand: Min guard         General transfer comment: cues for hand and L leg position.  Ambulation/Gait Ambulation/Gait assistance: Min guard Ambulation Distance (Feet): 126 Feet   Gait Pattern/deviations: Step-to pattern;Step-through pattern     General Gait Details: cues for posture and sequence   Stairs            Wheelchair Mobility    Modified Rankin (Stroke Patients Only)       Balance                                    Cognition Arousal/Alertness: Awake/alert                          Exercises      General Comments        Pertinent Vitals/Pain Pain Assessment: No/denies pain    Home Living                      Prior Function            PT Goals (current goals can now be found in the care plan section) Progress towards PT goals: Progressing toward goals    Frequency  7X/week    PT Plan Discharge plan needs to be updated    Co-evaluation             End of Session Equipment  Utilized During Treatment: Gait belt Activity Tolerance: Patient tolerated treatment well Patient left: in chair;with call bell/phone within reach;with chair alarm set     Time: 1649-1710 PT Time Calculation (min) (ACUTE ONLY): 21 min  Charges:  $Gait Training: 8-22 mins                    G Codes:      Claretha Cooper 02/01/2015, 5:21 PM Tresa Endo PT [phone]

## 2015-02-01 NOTE — Care Management Note (Addendum)
Case Management Note  Patient Details  Name: Monica Ward MRN: [id number] Date of Birth: [birth year]  Subjective/Objective:                   ANTERIOR APPROACH LEFT HEMI ARTHROPLASTY (Left) Action/Plan: Discharge planning  Expected Discharge Date:   (UNKNOWN)               Expected Discharge Plan:  [place]  In-House Referral:     Discharge planning Services  CM Consult  Post Acute Care Choice:    Choice offered to:  Patient  DME Arranged:  3-N-1, Walker rolling DME Agency:  [place]:  PT [place] Agency:  [place]  Status of Service:  Completed, signed off  Medicare Important Message Given:    Date Medicare IM Given:    Medicare IM give by:    Date Additional Medicare IM Given:    Additional Medicare Important Message give by:     If discussed at [place] of Stay Meetings, dates discussed:    Additional Comments: CM met with pt in room to offer choice of home health agency.  Pt chooses [facility] to render HHPT.  Address and contact information verified by pt.  Pt lives with husband and though he has dementia, pt states he is still able to help with her care.  Referral called to [facility] rep, Kristen.  CM called [facility] DME rep, Merry Proud to please deliver the rolling walker and 3n1 to room prior to discharge.  Pt asked if her glasses had been found.  CM checked with OR, ED and PACU.  Cm called [facility] who states she will also investigate.  RN states she has done a safety portal and had also checked the same depts as CM.   No other CM needs were communicated. [name], RN 02/01/2015, 2:25 PM

## 2015-02-01 NOTE — Evaluation (Signed)
Physical Therapy Evaluation Patient Details Name: Monica Ward MRN: [id number] DOB: 06/01/1929 Today's Date: 02/01/2015   History of Present Illness  Left displaced femoral neck fracture after mechanical fall. , s/p  direct anterior hemiarthroplasty on 01/01/15  Clinical Impression  Patient tolerated ambulation well. Family not present to confirm 24/7 caregiver availability. Patient will benefit from PT to address problems listed in note below.    Follow Up Recommendations SNF;Home health PT  24/7 supervision( family not present for  DC plan, ifdoes not have 24/7 caregivers, recommend  post acute rehab.)    Equipment Recommendations  Rolling walker with 5" wheels    Recommendations for Other Services       Precautions / Restrictions Precautions Precautions: Fall Restrictions Weight Bearing Restrictions: No LLE Weight Bearing: Weight bearing as tolerated      Mobility  Bed Mobility Overal bed mobility: Needs Assistance Bed Mobility: Supine to Sit     Supine to sit: Min guard     General bed mobility comments: cues for technique  Transfers Overall transfer level: Needs assistance Equipment used: Rolling walker (2 wheeled) Transfers: Sit to/from Stand Sit to Stand: Min assist         General transfer comment: cues for hand and L leg position.  Ambulation/Gait Ambulation/Gait assistance: Min assist Ambulation Distance (Feet): 20 Feet (x 2) Assistive device: Rolling walker (2 wheeled) Gait Pattern/deviations: Step-to pattern;Step-through pattern     General Gait Details: cues for posture and sequence  Stairs            Wheelchair Mobility    Modified Rankin (Stroke Patients Only)       Balance                                             Pertinent Vitals/Pain Pain Assessment: No/denies pain    Home Living Family/patient expects to be discharged to:: Private residence Living Arrangements: Spouse/significant other   Type  of Home: House Home Access: Level entry     Home Layout: One level Home Equipment: None Additional Comments: spouse with early stages of dementia and is independent per patient, patient states that daughter staying at home with spouse.    Prior Function Level of Independence: Independent               Hand Dominance        Extremity/Trunk Assessment               Lower Extremity Assessment: LLE deficits/detail   LLE Deficits / Details: able to  move leg to edge and bear weight  Cervical / Trunk Assessment: Normal  Communication   Communication: No difficulties  Cognition Arousal/Alertness: Awake/alert Behavior During Therapy: WFL for tasks assessed/performed Overall Cognitive Status: Within Functional Limits for tasks assessed                      General Comments      Exercises        Assessment/Plan    PT Assessment Patient needs continued PT services  PT Diagnosis Difficulty walking;Generalized weakness   PT Problem List Decreased strength;Decreased range of motion;Decreased activity tolerance;Decreased balance;Decreased mobility;Decreased safety awareness;Decreased knowledge of use of DME;Decreased knowledge of precautions  PT Treatment Interventions DME instruction;Gait training;Functional mobility training;Therapeutic activities;Therapeutic exercise;Patient/family education   PT Goals (Current goals can be found in the Care Plan section) Acute  Rehab PT Goals Patient Stated Goal: to go home PT Goal Formulation: With patient Time For Goal Achievement: 02/08/15 Potential to Achieve Goals: Good    Frequency 7X/week   Barriers to discharge Decreased caregiver support uncertainof 57/9 caregivers. Patient states that  daughter  can stay,     Co-evaluation PT/OT/SLP Co-Evaluation/Treatment: Yes Reason for Co-Treatment: For patient/therapist safety PT goals addressed during session: Mobility/safety with mobility         End of Session    Activity Tolerance: Patient tolerated treatment well Patient left: in chair;with call bell/phone within reach;with chair alarm set Nurse Communication: Mobility status         Time: 1010-1029 PT Time Calculation (min) (ACUTE ONLY): 19 min   Charges:   PT Evaluation $Initial PT Evaluation Tier I: 1 Procedure     PT G CodesClaretha Cooper 02/01/2015, 11:41 AM Tresa Endo PT [phone]

## 2015-02-01 NOTE — Progress Notes (Signed)
Utilization review completed.  

## 2015-02-01 NOTE — Progress Notes (Signed)
   Subjective:  Patient reports pain as mild to moderate.  No c/o.  Objective:   VITALS:   Filed Vitals:   01/31/15 2152 01/31/15 2245 02/01/15 0115 02/01/15 0300  BP: 116/47 116/42 112/39 108/45  Pulse: 60 62 66 68  Temp: 98.8 F (37.1 C) 97.7 F (36.5 C) 98.6 F (37 C) 98.5 F (36.9 C)  TempSrc: Axillary Oral Oral Oral  Resp: 16 16 16 16   Height:      Weight:      SpO2: 100% 100% 100% 98%    ABD soft Sensation intact distally Intact pulses distally Dorsiflexion/Plantar flexion intact Incision: dressing C/D/I Compartment soft   Lab Results  Component Value Date   WBC 7.1 02/01/2015   HGB 10.9* 02/01/2015   HCT 33.2* 02/01/2015   MCV 94.6 02/01/2015   PLT 152 02/01/2015   BMET    Component Value Date/Time   NA 136 02/01/2015 0444   K 3.6 02/01/2015 0444   CL 107 02/01/2015 0444   CO2 25 02/01/2015 0444   GLUCOSE 131* 02/01/2015 0444   BUN 13 02/01/2015 0444   CREATININE 0.49 02/01/2015 0444   CREATININE 0.61 12/27/2014 0952   CALCIUM 8.1* 02/01/2015 0444   GFRNONAA >60 02/01/2015 0444   GFRNONAA 73 11/22/2013 0853   GFRAA >60 02/01/2015 0444   GFRAA 84 11/22/2013 0853     Assessment/Plan: 1 Day Post-Op   Principal Problem:   Subcapital fracture of left hip (HCC) Active Problems:   Constipation, chronic   High cholesterol   Left displaced femoral neck fracture (HCC)   WBAT with walker DVT ppx: ASA 325mg  PO BID, SCDs, TEDs PO pain control PT/OT D/C planning   [place], Horald Pollen 02/01/2015, 7:14 AM   Rod Can, MD Cell [phone]

## 2015-02-01 NOTE — Progress Notes (Signed)
Initial Nutrition Assessment  DOCUMENTATION CODES:   Non-severe (moderate) malnutrition in context of chronic illness, Underweight  INTERVENTION:   Encourage PO intake RD to continue to monitor for needs  NUTRITION DIAGNOSIS:   Malnutrition related to chronic illness as evidenced by severe depletion of body fat, moderate depletions of muscle mass.  GOAL:   Patient will meet greater than or equal to 90% of their needs  MONITOR:   PO intake, Labs, Weight trends, Skin, I & O's  REASON FOR ASSESSMENT:   Consult Assessment of nutrition requirement/status  ASSESSMENT:   81 y.o. female who complains of Left hip pain after a mechanical ground level fall earlier today at home. She has severe left hip pain and is unable to weight bear. Lives with her husband who has dementia.  S/p 10/05 Left hip hemiarthroplasty  Pt reports eating well PTA and her appetite has not changed. Pt is eating 75% of meals at this time. Reviewed the importance of protein intake for healing purposes. Pt not interested in nutritional supplements as she feels she will be eating well. Pt's weight has remained stable around 115-120 lb.  Nutrition-Focused physical exam completed. Findings are severe fat depletion, moderate muscle depletion, and no edema.   Labs reviewed.  Diet Order:  Diet regular Room service appropriate?: Yes; Fluid consistency:: Thin  Skin:  Wound (see comment) (hip incision)  Last BM:  10/4  Height:   Ht Readings from Last 1 Encounters:  01/31/15 5\' 8"  (1.727 m)    Weight:   Wt Readings from Last 1 Encounters:  01/31/15 115 lb (52.164 kg)    Ideal Body Weight:  63.6 kg  BMI:  Body mass index is 17.49 kg/(m^2).  Estimated Nutritional Needs:   Kcal:  1300-1500  Protein:  70-80g  Fluid:  1.5L/day  EDUCATION NEEDS:   No education needs identified at this time  Clayton Bibles, MS, RD, LDN Pager: [phone] After Hours Pager: [phone]

## 2015-02-02 DIAGNOSIS — D62 Acute posthemorrhagic anemia: Secondary | ICD-10-CM

## 2015-02-02 DIAGNOSIS — D696 Thrombocytopenia, unspecified: Secondary | ICD-10-CM

## 2015-02-02 DIAGNOSIS — S72012D Unspecified intracapsular fracture of left femur, subsequent encounter for closed fracture with routine healing: Secondary | ICD-10-CM

## 2015-02-02 LAB — HEMOGLOBIN A1C
Hgb A1c MFr Bld: 5.6 % (ref 4.8–5.6)
Mean Plasma Glucose: 114 mg/dL

## 2015-02-02 LAB — CBC
HCT: 32.1 % — ABNORMAL LOW (ref 36.0–46.0)
Hemoglobin: 10.4 g/dL — ABNORMAL LOW (ref 12.0–15.0)
MCH: 30.4 pg (ref 26.0–34.0)
MCHC: 32.4 g/dL (ref 30.0–36.0)
MCV: 93.9 fL (ref 78.0–100.0)
Platelets: 147 10*3/uL — ABNORMAL LOW (ref 150–400)
RBC: 3.42 MIL/uL — ABNORMAL LOW (ref 3.87–5.11)
RDW: 13.4 % (ref 11.5–15.5)
WBC: 6.1 10*3/uL (ref 4.0–10.5)

## 2015-02-02 LAB — BASIC METABOLIC PANEL
Anion gap: 5 (ref 5–15)
BUN: 11 mg/dL (ref 6–20)
CO2: 25 mmol/L (ref 22–32)
Calcium: 8.2 mg/dL — ABNORMAL LOW (ref 8.9–10.3)
Chloride: 109 mmol/L (ref 101–111)
Creatinine, Ser: 0.5 mg/dL (ref 0.44–1.00)
GFR calc Af Amer: >60 mL/min (ref >60)
GFR calc non Af Amer: >60 mL/min (ref >60)
Glucose, Bld: 121 mg/dL — ABNORMAL HIGH (ref 65–99)
Potassium: 4.3 mmol/L (ref 3.5–5.1)
Sodium: 139 mmol/L (ref 135–145)

## 2015-02-02 MED ORDER — ASPIRIN EC 325 MG PO TBEC: 325.0000 mg | DELAYED_RELEASE_TABLET | Freq: Two times a day (BID) | ORAL | Status: DC

## 2015-02-02 MED ORDER — SENNA 8.6 MG PO TABS: 1.0000 | ORAL_TABLET | Freq: Two times a day (BID) | ORAL | Status: DC

## 2015-02-02 MED ORDER — HYDROCODONE-ACETAMINOPHEN 5-325 MG PO TABS: 1.0000 | ORAL_TABLET | ORAL | Status: DC | PRN

## 2015-02-02 MED ORDER — DOCUSATE SODIUM 100 MG PO CAPS: 100.0000 mg | ORAL_CAPSULE | Freq: Two times a day (BID) | ORAL | Status: DC

## 2015-02-02 MED ORDER — POLYETHYLENE GLYCOL 3350 17 GM/SCOOP PO POWD: 117.0000 g | Freq: Two times a day (BID) | ORAL | Status: DC

## 2015-02-02 NOTE — Progress Notes (Signed)
Occupational Therapy Treatment Patient Details Name: Monica Ward MRN: [id number] DOB: July 05, 1929 Today's Date: 02/02/2015    History of present illness Left displaced femoral neck fracture after mechanical fall. , s/p  direct anterior hemiarthroplasty on 01/01/15   OT comments  Patient progressing nicely towards OT goals, continue plan of care for now. Educated pt on use of 3-in-1 over toilet and in [place] (hand out given for safe tub/shower transfers using 3-in-1). Pt states her daughter and/or husband will be present for 24/7 supervision/assistance post acute d/c.    Follow Up Recommendations  Home health OT;Supervision/Assistance - 24 hour    Equipment Recommendations  3 in 1 bedside comode    Recommendations for Other Services  None at this time   Precautions / Restrictions Precautions Precautions: Fall Precaution Comments: direct anterior approach Restrictions Weight Bearing Restrictions: Yes LLE Weight Bearing: Weight bearing as tolerated    Mobility Bed Mobility Overal bed mobility: Needs Assistance Bed Mobility: Supine to Sit     Supine to sit: Min guard     General bed mobility comments: cues for technique  Transfers Overall transfer level: Needs assistance Equipment used: Rolling walker (2 wheeled) Transfers: Sit to/from Stand Sit to Stand: Min guard General transfer comment: cues for hand and L leg position, min guard for safety    Balance Overall balance assessment: Needs assistance Sitting-balance support: No upper extremity supported;Feet supported Sitting balance-Leahy Scale: Good     Standing balance support: Bilateral upper extremity supported;During functional activity Standing balance-Leahy Scale: Fair   ADL Overall ADL's : Needs assistance/impaired General ADL Comments: Pt able to reach RLE for LB ADLs, but unable to reach LLE. Due to no precautions secondary to direct anterior approach, recommend pt work on reaching LLE as a stretch  exercise program to increase independence with LB ADLs. Pt ambulated in/out of BR for toilet transfer using 3-in-1, min cues required. Educated pt on using 3-in-1 in tub/shower for safety and handout given. Pt states her daughter and/or husband will be home with her all the time, pt states she is aware she should not be left alone.     Cognition   Behavior During Therapy: WFL for tasks assessed/performed Overall Cognitive Status: Within Functional Limits for tasks assessed        Pain Assessment: Faces Faces Pain Scale: Hurts little more Pain Location: LLE Pain Descriptors / Indicators: Sore Pain Intervention(s): Monitored during session;Repositioned;Ice applied   Frequency Min 2X/week     Progress Toward Goals  OT Goals(current goals can now befound in the care plan section)  Progress towards OT goals: Progressing toward goals     Plan Discharge plan remains appropriate    End of Session Equipment Utilized During Treatment: Rolling walker   Activity Tolerance Patient tolerated treatment well   Patient Left in chair;with call bell/phone within reach;with chair alarm set    Time: 1324-4010 OT Time Calculation (min): 26 min  Charges: OT General Charges $OT Visit: 1 Procedure OT Treatments $Self Care/Home Management : 23-37 mins  CLAY,PATRICIA , MS, OTR/L, CLT Pager: [phone]  02/02/2015, 11:22 AM

## 2015-02-02 NOTE — Progress Notes (Signed)
Physical Therapy Treatment Patient Details Name: Monica Ward MRN: [id number] DOB: 1929-08-23 Today's Date: 02/02/2015    History of Present Illness Left displaced femoral neck fracture after mechanical fall. , s/p  direct anterior hemiarthroplasty on 01/01/15    PT Comments    Patient is progressing very well. Plans DC today.  Follow Up Recommendations  Home health PT;Supervision/Assistance - 24 hour     Equipment Recommendations  Rolling walker with 5" wheels    Recommendations for Other Services       Precautions / Restrictions Precautions Precautions: Fall Precaution Comments: direct anterior approach Restrictions Weight Bearing Restrictions: Yes LLE Weight Bearing: Weight bearing as tolerated    Mobility  Bed Mobility Overal bed mobility: Needs Assistance Bed Mobility: Supine to Sit     Supine to sit: Min guard     General bed mobility comments: cues for technique  Transfers Overall transfer level: Needs assistance Equipment used: Rolling walker (2 wheeled) Transfers: Sit to/from Stand Sit to Stand: Min guard         General transfer comment: cues for hand and L leg position, min guard for safety  Ambulation/Gait Ambulation/Gait assistance: Min guard Ambulation Distance (Feet): 200 Feet Assistive device: Rolling walker (2 wheeled) Gait Pattern/deviations: Step-through pattern     General Gait Details: cues for posture and sequence   Stairs            Wheelchair Mobility    Modified Rankin (Stroke Patients Only)       Balance Overall balance assessment: Needs assistance Sitting-balance support: No upper extremity supported;Feet supported Sitting balance-Leahy Scale: Good     Standing balance support: Bilateral upper extremity supported;During functional activity Standing balance-Leahy Scale: Fair                      Cognition Arousal/Alertness: Awake/alert Behavior During Therapy: WFL for tasks  assessed/performed Overall Cognitive Status: Within Functional Limits for tasks assessed                      Exercises Total Joint Exercises Ankle Circles/Pumps: AROM;Both;10 reps Long Arc Quad: AROM;Left;10 reps;Seated    General Comments        Pertinent Vitals/Pain Pain Assessment: Faces Pain Score: 2  Faces Pain Scale: Hurts little more Pain Location: back Pain Descriptors / Indicators: Aching Pain Intervention(s): Premedicated before session    Home Living                      Prior Function            PT Goals (current goals can now be found in the care plan section) Progress towards PT goals: Progressing toward goals    Frequency  7X/week    PT Plan Current plan remains appropriate    Co-evaluation             End of Session   Activity Tolerance: Patient tolerated treatment well Patient left: in chair;with call bell/phone within reach;with chair alarm set     Time: 6629-4765 PT Time Calculation (min) (ACUTE ONLY): 26 min  Charges:  $Gait Training: 23-37 mins                    G Codes:      Monica Ward 02/02/2015, 12:50 PM

## 2015-02-02 NOTE — Discharge Summary (Addendum)
Physician Discharge Summary  Monica Ward UXL:[id number] DOB: [birth year] DOA: 01/31/2015  PCP: [name], MD  Admit date: 01/31/2015 Discharge date: 02/05/2015  Recommendations for Outpatient Follow-up:  1. F/u with Dr. Lyla Ward in 7-10 days.   2. HH PT/OT/RN 3. Continue aspirin 325mg  BID for DVT prophylaxis  Discharge Diagnoses:  Principal Problem:   Subcapital fracture of left hip (HCC) Active Problems:   Constipation, chronic   High cholesterol   Left displaced femoral neck fracture (HCC)   Postoperative anemia due to acute blood loss   Thrombocytopenia (HCC)   Moderate malnutrition ([place])   Discharge Condition: stable, improved  Diet recommendation: regular  Wt Readings from Last 3 Encounters:  01/31/15 52.164 kg (115 lb)  12/27/14 52.617 kg (116 lb)  02/03/14 54.114 kg (119 lb 4.8 oz)    History of present illness:   The patient is a 81 y.o. year-old female with history of leiomyosarcoma status post excision with recurrent constipation, small bowel obstructions since her operation in 2003, high cholesterol, arthritis who presents with mechanical fall with resultant left hip pain. Hip/pelvis x-ray demonstrated an acute traumatic mildly impacted subcapital left femoral neck fracture.   10/5: Left hip hemiarthroplasty, anterior approach by Dr. [name] Course:    Subcapital fracture of the left hip due to mechanical fall.  Underwent ORIF/left hip hemiarthroplasty on 53/6 without complication.  She was able to ambulate with rolling walker and was set up for home health physical and occupational therapy.  She was prescribed aspirin 325mg  BID to continue to DVT prophylaxis per orthopedics recommendations.    Hyperlipidemia, stable, continued atorvastatin  Chronic constipation, she was given colace, senna, twice daily MiraLAX   Mild hyperglycemia due to stress from fall and fracture.  A1c was 5.6.    Mild acute postoperative blood loss anemia  and thrombocytopenia with hemoglobin 10.4mg /dl and platelets 147 on date of discharge.  She did not require blood transfusion.  Repeat CBC by PCP in 2-4 weeks.    Moderate malnutrition.  Recommended regular diet and supplements.  Consultants:  Orthopedic surgery, Dr. Lyla Ward  Procedures: 10/5: Left hip hemiarthroplasty, anterior approach by Dr. [name]  Antibiotics:  none  Discharge Exam: Filed Vitals:   02/02/15 1347  BP: 107/50  Pulse: 66  Temp: 97.7 F (36.5 C)  Resp: 15   Filed Vitals:   02/01/15 1407 02/01/15 2126 02/02/15 0600 02/02/15 1347  BP: 112/65 127/38 104/37 107/50  Pulse: 63 68 69 66  Temp: 97.6 F (36.4 C) 97.6 F (36.4 C) 98 F (36.7 C) 97.7 F (36.5 C)  TempSrc: Oral Oral Oral Oral  Resp: 16 15 16 15   Height:      Weight:      SpO2: 100% 98% 96% 97%     General: Thin F, No acute distress, sitting in chair  HEENT: NCAT, MMM  Cardiovascular: RRR, nl S1, S2 no mrg, 2+ pulses, warm extremities  Respiratory: CTAB, no increased WOB  Abdomen: NABS, soft, NT/ND  MSK: Normal tone and bulk, left anterior hip dressing wtihout blood, no surrounding swelling, ecchymosis or erythema. Pulse palpable with < 2 sec CR. Sensation intact to light touch left foot  Neuro: Grossly intact  Discharge Instructions      Discharge Instructions    Call MD for:  difficulty breathing, headache or visual disturbances    Complete by:  As directed      Call MD for:  extreme fatigue    Complete by:  As directed  Call MD for:  hives    Complete by:  As directed      Call MD for:  persistant dizziness or light-headedness    Complete by:  As directed      Call MD for:  persistant nausea and vomiting    Complete by:  As directed      Call MD for:  redness, tenderness, or signs of infection (pain, swelling, redness, odor or green/yellow discharge around incision site)    Complete by:  As directed      Call MD for:  severe uncontrolled pain     Complete by:  As directed      Call MD for:  temperature >100.4    Complete by:  As directed      Diet general    Complete by:  As directed      Increase activity slowly    Complete by:  As directed             Medication List    STOP taking these medications        acetaminophen 500 MG tablet  Commonly known as:  TYLENOL     aspirin 81 MG chewable tablet  Replaced by:  aspirin EC 325 MG tablet      TAKE these medications        aspirin EC 325 MG tablet  Take 1 tablet (325 mg total) by mouth 2 (two) times daily after a meal.     atorvastatin 10 MG tablet  Commonly known as:  LIPITOR  Take 1 tablet (10 mg total) by mouth daily at 6 PM.     docusate sodium 100 MG capsule  Commonly known as:  COLACE  Take 1 capsule (100 mg total) by mouth 2 (two) times daily.     HYDROcodone-acetaminophen 5-325 MG tablet  Commonly known as:  NORCO  Take 1-2 tablets by mouth every 4 (four) hours as needed for moderate pain.     multivitamin with minerals tablet  Take 1 tablet by mouth daily.     OMEGA 3 PO  Take 1 capsule by mouth daily.     polyethylene glycol powder powder  Commonly known as:  MIRALAX  Take 117 g by mouth 2 (two) times daily.     senna 8.6 MG Tabs tablet  Commonly known as:  SENOKOT  Take 1 tablet (8.6 mg total) by mouth 2 (two) times daily.     VITAMIN B-12 PO  Take 1 tablet by mouth daily.     vitamin C 100 MG tablet  Take 100 mg by mouth daily.     VITAMIN E PO  Take 1 tablet by mouth daily.     ZINC PO  Take 1 tablet by mouth daily.       Follow-up Information    Follow up with Swinteck, Horald Pollen, MD. Schedule an appointment as soon as possible for a visit in 2 weeks.   Specialty:  Orthopedic Surgery   Why:  For wound re-check   Contact information:   [address]. [place] [address] [phone]       Follow up with [facility].   Why:  rolling walker and 3n1   Contact information:   [place] [address] [phone]       Follow up with [facility].   Why:  home health physical therapy   Contact information:   [address]  [place] [address] [phone]        The results of significant diagnostics from this hospitalization (including imaging, microbiology, ancillary and laboratory) are listed below for reference.    Significant Diagnostic Studies: Pelvis Portable  01/31/2015   CLINICAL DATA:  Postop left hip surgery.  EXAM: PORTABLE PELVIS 1-2 VIEWS  COMPARISON:  None.  FINDINGS: A single spot AP radiograph of the pelvis is provided for review.  Post left bipolar hip replacement. Alignment appears anatomic given solitary AP projection. Expected adjacent subcutaneous emphysema. No definite fracture. Multiple surgical clips overlie the left hemipelvis peer no radiopaque foreign body.  IMPRESSION: Post left bipolar hip replacement without evidence of complication.   Electronically Signed   By: Sandi Mariscal M.D.   On: 01/31/2015 19:35   Dg Knee Complete 4 Views Left  01/31/2015   CLINICAL DATA:  Fall.  Knee pain  EXAM: LEFT KNEE - COMPLETE 4+ VIEW  COMPARISON:  None.  FINDINGS: There is no evidence of fracture, dislocation, or joint effusion. There is no evidence of arthropathy or other focal bone abnormality. Soft tissues are unremarkable.  IMPRESSION: Negative.   Electronically Signed   By: Franchot Gallo M.D.   On: 01/31/2015 09:51   Dg C-arm 1-60 Min-no Report  01/31/2015   CLINICAL DATA: hip fracture   C-ARM 1-60 MINUTES  Fluoroscopy was utilized by the requesting physician.  No radiographic  interpretation.    Dg Hip Operative Unilat With Pelvis Left  01/31/2015   CLINICAL DATA:  Left hip fracture.  EXAM: DG C-ARM 1-60 MIN-NO REPORT; OPERATIVE LEFT HIP WITH PELVIS  FLUOROSCOPY TIME:  21 seconds  COMPARISON:  Left hip radiographs - 01/31/2015  FINDINGS: 2 spot anterior projection fluoroscopic images of the left hip and  lower pelvis are provided for review.  Images demonstrate the sequela of left bipolar hip replacement. Alignment appears anatomic given solitary anterior projection. No definite fracture. Expected subcutaneous emphysema about the operative site. Multiple surgical clips overlie the left lower pelvis. No radiopaque foreign body.  IMPRESSION: Post left bipolar hip replacement without evidence of complication.   Electronically Signed   By: Sandi Mariscal M.D.   On: 01/31/2015 17:43   Dg Hip Unilat With Pelvis 2-3 Views Left  01/31/2015   CLINICAL DATA:  Patient tripped and fell over a chair while at home yesterday and injured the left hip and left knee. Patient is unable to weightbear. Initial encounter.  EXAM: DG HIP (WITH OR WITHOUT PELVIS) 2-3V LEFT  COMPARISON:  None.  FINDINGS: Mildly impacted fracture involving the subcapital region of the left femoral neck. Hip joint anatomically aligned with moderate inferomedial joint space narrowing.  Included AP pelvis demonstrates an intact contralateral right hip joint with symmetric moderate inferomedial joint space narrowing. Sacroiliac joints and symphysis pubis intact with mild degenerative changes. Severe degenerative changes at the lumbosacral junction.  IMPRESSION: Acute traumatic mildly impacted subcapital left femoral neck fracture.   Electronically Signed   By: Evangeline Dakin M.D.   On: 01/31/2015 09:51    Microbiology: Recent Results (from the past 240 hour(s))  Culture, Urine     Status: None   Collection Time: 01/31/15 11:40 AM  Result Value Ref Range Status   Specimen Description URINE, RANDOM  Final   Special Requests NONE  Final   Culture   Final    MULTIPLE SPECIES PRESENT, SUGGEST RECOLLECTION Performed at [facility]    Report Status 02/01/2015 FINAL  Final     Labs: Basic  Metabolic Panel:  Recent Labs Lab 01/31/15 0915 02/01/15 0444 02/02/15 0420  NA 139 136 139  K 3.6 3.6 4.3  CL 106 107 109  CO2 27 25 25   GLUCOSE  135* 131* 121*  BUN 16 13 11   CREATININE 0.52 0.49 0.50  CALCIUM 8.9 8.1* 8.2*   Liver Function Tests: No results for input(s): AST, ALT, ALKPHOS, BILITOT, PROT, ALBUMIN in the last 168 hours. No results for input(s): LIPASE, AMYLASE in the last 168 hours. No results for input(s): AMMONIA in the last 168 hours. CBC:  Recent Labs Lab 01/31/15 0915 02/01/15 0444 02/02/15 0420  WBC 8.3 7.1 6.1  NEUTROABS 7.3  --   --   HGB 12.8 10.9* 10.4*  HCT 39.1 33.2* 32.1*  MCV 93.8 94.6 93.9  PLT 171 152 147*   Cardiac Enzymes: No results for input(s): CKTOTAL, CKMB, CKMBINDEX, TROPONINI in the last 168 hours. BNP: BNP (last 3 results) No results for input(s): BNP in the last 8760 hours.  ProBNP (last 3 results) No results for input(s): PROBNP in the last 8760 hours.  CBG:  Recent Labs Lab 01/31/15 0857  GLUCAP 122*    Time coordinating discharge: 35 minutes  Signed:  SHORT, MACKENZIE  [facility] 02/05/2015, 6:55 PM

## 2015-02-02 NOTE — Care Management Important Message (Signed)
Important Message  Patient Details  Name: XZANDRIA CLEVINGER MRN: [id number] Date of Birth: 19-Jan-1930   Medicare Important Message Given:  Yes-second notification given    Camillo Flaming 02/02/2015, 11:47 AMImportant Message  Patient Details  Name: GRADIE BUTRICK MRN: [id number] Date of Birth: 23-Jun-1929   Medicare Important Message Given:  Yes-second notification given    Camillo Flaming 02/02/2015, 11:47 AM

## 2015-02-02 NOTE — Progress Notes (Signed)
   Subjective:  Patient reports pain as mild to moderate.  No c/o.  Objective:   VITALS:   Filed Vitals:   02/01/15 0950 02/01/15 1407 02/01/15 2126 02/02/15 0600  BP: 128/58 112/65 127/38 104/37  Pulse: 64 63 68 69  Temp: 97.7 F (36.5 C) 97.6 F (36.4 C) 97.6 F (36.4 C) 98 F (36.7 C)  TempSrc: Oral Oral Oral Oral  Resp: 18 16 15 16   Height:      Weight:      SpO2: 100% 100% 98% 96%    ABD soft Sensation intact distally Intact pulses distally Dorsiflexion/Plantar flexion intact Incision: dressing C/D/I Compartment soft   Lab Results  Component Value Date   WBC 6.1 02/02/2015   HGB 10.4* 02/02/2015   HCT 32.1* 02/02/2015   MCV 93.9 02/02/2015   PLT 147* 02/02/2015   BMET    Component Value Date/Time   NA 139 02/02/2015 0420   K 4.3 02/02/2015 0420   CL 109 02/02/2015 0420   CO2 25 02/02/2015 0420   GLUCOSE 121* 02/02/2015 0420   BUN 11 02/02/2015 0420   CREATININE 0.50 02/02/2015 0420   CREATININE 0.61 12/27/2014 0952   CALCIUM 8.2* 02/02/2015 0420   GFRNONAA >60 02/02/2015 0420   GFRNONAA 73 11/22/2013 0853   GFRAA >60 02/02/2015 0420   GFRAA 84 11/22/2013 0853     Assessment/Plan: 2 Days Post-Op   Principal Problem:   Subcapital fracture of left hip (HCC) Active Problems:   Constipation, chronic   High cholesterol   Left displaced femoral neck fracture (HCC)   WBAT with walker DVT ppx: ASA 325mg  PO BID, SCDs, TEDs PO pain control PT/OT D/C home with [facility] PT when ok with primary service   Swinteck, Horald Pollen 02/02/2015, 10:03 AM   Rod Can, MD Cell [phone]

## 2015-02-05 DIAGNOSIS — E440 Moderate protein-calorie malnutrition: Secondary | ICD-10-CM

## 2015-02-05 NOTE — Progress Notes (Signed)
02/02/15 RN reviewed discharge instructions with patient and family. All questions answered.   Paperwork and prescriptions given.   NT rolled patient down in wheelchair with all belongings and equipment to family car.

## 2015-02-15 ENCOUNTER — Telehealth: Payer: Self-pay

## 2015-02-15 NOTE — Telephone Encounter (Signed)
Andria from [facility] called today to inform you that Pt. [name], Monica Ward has decided that she no longer wants nursing home service, she just wants physical therapy, so she will still do Pt twice a week. If you have any question Garnette Scheuermann can be reached [phone]

## 2015-02-16 NOTE — Telephone Encounter (Signed)
Thank you for trying to help her. She is very independent. I wish she would let you go ahead and do the nursing care.

## 2015-04-02 ENCOUNTER — Ambulatory Visit (INDEPENDENT_AMBULATORY_CARE_PROVIDER_SITE_OTHER): Payer: Medicare Other | Admitting: Emergency Medicine

## 2015-04-02 ENCOUNTER — Ambulatory Visit (INDEPENDENT_AMBULATORY_CARE_PROVIDER_SITE_OTHER): Payer: Medicare Other

## 2015-04-02 VITALS — BP 122/80 | HR 76 | Temp 97.7°F | Resp 16 | Ht 68.0 in | Wt 116.0 lb

## 2015-04-02 DIAGNOSIS — R634 Abnormal weight loss: Secondary | ICD-10-CM | POA: Diagnosis not present

## 2015-04-02 DIAGNOSIS — R0781 Pleurodynia: Secondary | ICD-10-CM

## 2015-04-02 DIAGNOSIS — M5489 Other dorsalgia: Secondary | ICD-10-CM | POA: Diagnosis not present

## 2015-04-02 MED ORDER — HYDROCODONE-ACETAMINOPHEN 5-325 MG PO TABS: ORAL_TABLET | ORAL | Status: DC

## 2015-04-02 NOTE — Patient Instructions (Signed)
Take 1-2 boost or ensure every day to help with your weight.

## 2015-04-02 NOTE — Progress Notes (Signed)
This chart was scribed for Monica Queen, MD by Moises Blood, Medical Scribe. This patient was seen in Room 8 and the patient's care was started 1:05 PM.  Chief Complaint:  Chief Complaint  Patient presents with  . Back Pain    x 4 days     HPI: Monica Ward is a 81 y.o. female who reports to [facility] today complaining of sudden onset right lower back pain that started 4 days ago. She was trying to close the trunk but closed it from the side and felt a strain her right lower back. She didn't really feel the tightness until the day after. She had a few pills left over after her surgery and took those for temporary relief. She didn't feel tender while laying down during the physical exam, but did notice the pain when she sat back up after the physical exam.   History Pt had subcapital fracture in her left hip 2 months ago. She had hip surgery done at the time.   Weight She's been losing weight and has become too skinny. She requested suggestion to gain weight.   Family She plans to travel down to [place]. She plans to sell her home soon. Her daughter will drive her and her husband down. Her husband has been "losing his mind".   Past Medical History  Diagnosis Date  . High cholesterol   . SBO (small bowel obstruction) ([place]) 08/2009    Early, partial SBO improved with conservative Tx   . Diverticulitis     Per chart   . Cancer ([place]) Around 2002-2003    Unclear history of "7 lb" abdominal tumor treated at [facility] ~2003 s/p open abdominal colonic resection and radiation  . Complication of anesthesia     "trouble waking up; too much med given" (01/28/2012)  . Arthritis     "probably" (01/28/2012)  . 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 Patient had surgery in 2000 and mass which was presumed to be a leiomyosarcoma rather than a spindle cell cancer. I have no further records of path reports to give me a good definite path diagnosis of her  tumor resection.   . Constipation 02/03/2014  . Diverticulosis 02/03/2014  . Precordial pain 02/12/2012  . Constipation, chronic 02/06/2014   Past Surgical History  Procedure Laterality Date  . Cesarean section  1968  . Tumor removal  1990's;  X 2    "benign"  . Appendectomy  1968  . Tonsillectomy      "when I was a child"  . Colectomy  2003    "cancer"  . Abdominal hysterectomy  ~ 1985    ovaries resected B; DUB.  Marland Kitchen Total hip arthroplasty Left 01/31/2015    Procedure:  ANTERIOR APPROACH  LEFT HEMI ARTHROPLASTY;  Surgeon: Rod Can, MD;  Location: [facility];  Service: Orthopedics;  Laterality: Left;   Social History   Social History  . Marital Status: Married    Spouse Name: N/A  . Number of Children: N/A  . Years of Education: N/A   Social History Main Topics  . Smoking status: Never Smoker   . Smokeless tobacco: Never Used  . Alcohol Use: 1.8 oz/week    3 Glasses of wine per week     Comment: social  . Drug Use: No  . Sexual Activity: Not Currently   Other Topics Concern  . None   Social History Narrative  Marital status: married x 45 years; Husband with new onset dementia in 2016.      Children: 3 children in area; 4 grandchildren; no gg.      Lives: with husband in [place]. Goes to [facility] every winter.      Employment: Retired      Tobacco: smoked briefly in college      Alcohol: 3 glasses of wine per week.      Exercise:  Very little.      ADLs: performs all ADLs; still drives.  No assistant devices.      Advanced Directives:  FULL CODE; no prolonged measures.         Family History  Problem Relation Age of Onset  . Heart failure Father 61    Heart failure  . Heart disease Father    No Known Allergies Prior to Admission medications   Medication Sig Start Date End Date Taking? Authorizing Provider  Ascorbic Acid (VITAMIN C) 100 MG tablet Take 100 mg by mouth daily.   Yes Historical Provider, MD  aspirin EC 325 MG tablet Take 1 tablet (325 mg total)  by mouth 2 (two) times daily after a meal. 02/02/15  Yes Rod Can, MD  atorvastatin (LIPITOR) 10 MG tablet Take 1 tablet (10 mg total) by mouth daily at 6 PM. 01/19/15  Yes Wardell Honour, MD  Cyanocobalamin (VITAMIN B-12 PO) Take 1 tablet by mouth daily.   Yes Historical Provider, MD  docusate sodium (COLACE) 100 MG capsule Take 1 capsule (100 mg total) by mouth 2 (two) times daily. 02/02/15  Yes Janece Canterbury, MD  HYDROcodone-acetaminophen (NORCO) 5-325 MG tablet Take 1-2 tablets by mouth every 4 (four) hours as needed for moderate pain. 02/02/15  Yes Rod Can, MD  Multiple Vitamins-Minerals (MULTIVITAMIN WITH MINERALS) tablet Take 1 tablet by mouth daily.   Yes Historical Provider, MD  Multiple Vitamins-Minerals (ZINC PO) Take 1 tablet by mouth daily.   Yes Historical Provider, MD  Omega-3 Fatty Acids (OMEGA 3 PO) Take 1 capsule by mouth daily.   Yes Historical Provider, MD  polyethylene glycol powder (MIRALAX) powder Take 117 g by mouth 2 (two) times daily. 02/02/15  Yes Janece Canterbury, MD  senna (SENOKOT) 8.6 MG TABS tablet Take 1 tablet (8.6 mg total) by mouth 2 (two) times daily. 02/02/15  Yes Janece Canterbury, MD  VITAMIN E PO Take 1 tablet by mouth daily.   Yes Historical Provider, MD     ROS:  Constitutional: negative for fever, chills, night sweats, weight changes, or fatigue  HEENT: negative for vision changes, hearing loss, congestion, rhinorrhea, ST, epistaxis, or sinus pressure Cardiovascular: negative for chest pain or palpitations Respiratory: negative for hemoptysis, wheezing, shortness of breath, or cough Abdominal: negative for abdominal pain, nausea, vomiting, diarrhea, or constipation Dermatological: negative for rash Neurologic: negative for headache, dizziness, or syncope Musc: positive for back pain (right lower)  All other systems reviewed and are otherwise negative with the exception to those above and in the HPI.  PHYSICAL EXAM: Filed Vitals:   04/02/15  1251  BP: 122/80  Pulse: 76  Temp: 97.7 F (36.5 C)  Resp: 16   Body mass index is 17.64 kg/(m^2).   General: Alert, no acute distress HEENT:  Normocephalic, atraumatic, oropharynx patent. Eye: Juliette Mangle [facility] Cardiovascular:  Regular rate and rhythm, no rubs murmurs or gallops.  No Carotid bruits, radial pulse intact. No pedal edema.  Respiratory: Clear to auscultation bilaterally. Breath sounds symmetrical. No wheezes, rales, or rhonchi.  No cyanosis, no use of accessory musculature Abdominal: No organomegaly, no tenderness on the RLQ or LLQabdomen is soft and non-tender, positive bowel sounds. No masses. Musculoskeletal: Gait intact. No edema, tenderness Skin: No rashes. Neurologic: Facial musculature symmetric. Psychiatric: Patient acts appropriately throughout our interaction.  Lymphatic: No cervical or submandibular lymphadenopathy Genitourinary/Anorectal: No acute findings   LABS:   EKG/XRAY:   Primary read interpreted by Dr. Everlene Farrier at [facility]: chest xray: no rib fx seen, no evidence for pneumothorax Back xray: Severe DDD at L5 - S1, 1cm anterolisthesis at L4 and L5, calcification of disc area between L2-L3 with severe scoliosis, no definite vertebrae fx   ASSESSMENT/PLAN: Hopefully she has just pulled a muscle with her injury with the car trunk. I gave her a limited number of hydrocodone. She is advised to take only half a tablet. I need to see her in one week if she is not significantly better.I personally performed the services described in this documentation, which was scribed in my presence. The recorded information has been reviewed and is accurate.  By signing my name below, I, Moises Blood, attest that this documentation has been prepared under the direction and in the presence of Monica Queen, MD. Electronically Signed: Moises Blood, [place]. 04/02/2015 , 1:05 PM .    Gross sideeffects, risk and benefits, and alternatives of medications d/w patient. Patient is aware  that all medications have potential sideeffects and we are unable to predict every sideeffect or drug-drug interaction that may occur.  Monica Queen MD 04/02/2015 1:05 PM

## 2015-09-27 HISTORY — PX: CATARACT EXTRACTION, BILATERAL: SHX1313

## 2015-12-28 ENCOUNTER — Encounter: Payer: Medicare Other | Admitting: Family Medicine

## 2016-01-01 ENCOUNTER — Encounter: Payer: Self-pay | Admitting: Family Medicine

## 2016-01-01 ENCOUNTER — Ambulatory Visit (INDEPENDENT_AMBULATORY_CARE_PROVIDER_SITE_OTHER): Payer: Medicare Other | Admitting: Family Medicine

## 2016-01-01 VITALS — BP 106/62 | HR 63 | Temp 98.2°F | Resp 18 | Ht 68.0 in | Wt 113.2 lb

## 2016-01-01 DIAGNOSIS — E440 Moderate protein-calorie malnutrition: Secondary | ICD-10-CM

## 2016-01-01 DIAGNOSIS — S81802A Unspecified open wound, left lower leg, initial encounter: Secondary | ICD-10-CM

## 2016-01-01 DIAGNOSIS — E538 Deficiency of other specified B group vitamins: Secondary | ICD-10-CM

## 2016-01-01 DIAGNOSIS — D696 Thrombocytopenia, unspecified: Secondary | ICD-10-CM

## 2016-01-01 DIAGNOSIS — Z0000 Encounter for general adult medical examination without abnormal findings: Secondary | ICD-10-CM | POA: Diagnosis not present

## 2016-01-01 DIAGNOSIS — E7800 Pure hypercholesterolemia, unspecified: Secondary | ICD-10-CM

## 2016-01-01 LAB — POCT URINALYSIS DIP (MANUAL ENTRY)
Bilirubin, UA: NEGATIVE
Glucose, UA: NEGATIVE
Ketones, POC UA: NEGATIVE
Nitrite, UA: NEGATIVE
Spec Grav, UA: 1.015
Urobilinogen, UA: 0.2
pH, UA: 5.5

## 2016-01-01 LAB — COMPREHENSIVE METABOLIC PANEL
ALT: 16 U/L (ref 6–29)
AST: 19 U/L (ref 10–35)
Albumin: 4.4 g/dL (ref 3.6–5.1)
Alkaline Phosphatase: 56 U/L (ref 33–130)
BUN: 18 mg/dL (ref 7–25)
CO2: 27 mmol/L (ref 20–31)
Calcium: 9.5 mg/dL (ref 8.6–10.4)
Chloride: 103 mmol/L (ref 98–110)
Creat: 0.63 mg/dL (ref 0.60–0.88)
Glucose, Bld: 83 mg/dL (ref 65–99)
Potassium: 4 mmol/L (ref 3.5–5.3)
Sodium: 141 mmol/L (ref 135–146)
Total Bilirubin: 0.7 mg/dL (ref 0.2–1.2)
Total Protein: 7 g/dL (ref 6.1–8.1)

## 2016-01-01 LAB — CBC WITH DIFFERENTIAL/PLATELET
Basophils Absolute: 0 cells/uL (ref 0–200)
Basophils Relative: 0 %
Eosinophils Absolute: 94 cells/uL (ref 15–500)
Eosinophils Relative: 2 %
HCT: 41 % (ref 35.0–45.0)
Hemoglobin: 13.6 g/dL (ref 11.7–15.5)
Lymphocytes Relative: 24 %
Lymphs Abs: 1128 cells/uL (ref 850–3900)
MCH: 30.5 pg (ref 27.0–33.0)
MCHC: 33.2 g/dL (ref 32.0–36.0)
MCV: 91.9 fL (ref 80.0–100.0)
MPV: 10.8 fL (ref 7.5–12.5)
Monocytes Absolute: 470 cells/uL (ref 200–950)
Monocytes Relative: 10 %
Neutro Abs: 3008 cells/uL (ref 1500–7800)
Neutrophils Relative %: 64 %
Platelets: 265 10*3/uL (ref 140–400)
RBC: 4.46 MIL/uL (ref 3.80–5.10)
RDW: 13.8 % (ref 11.0–15.0)
WBC: 4.7 10*3/uL (ref 3.8–10.8)

## 2016-01-01 LAB — VITAMIN B12: Vitamin B-12: 1100 pg/mL (ref 200–1100)

## 2016-01-01 LAB — LIPID PANEL
Cholesterol: 235 mg/dL — ABNORMAL HIGH (ref 125–200)
HDL: 82 mg/dL (ref >=46)
LDL Cholesterol: 139 mg/dL — ABNORMAL HIGH (ref <130)
Total CHOL/HDL Ratio: 2.9 Ratio (ref <=5.0)
Triglycerides: 69 mg/dL (ref <150)
VLDL: 14 mg/dL (ref <30)

## 2016-01-01 LAB — POC MICROSCOPIC URINALYSIS (UMFC): Mucus: ABSENT

## 2016-01-01 MED ORDER — MUPIROCIN 2 % EX OINT: 1.0000 "application " | TOPICAL_OINTMENT | Freq: Every day | CUTANEOUS | 0 refills | Status: DC

## 2016-01-01 NOTE — Progress Notes (Addendum)
Subjective:    Patient ID: Monica Ward, female    DOB: December 01, 1929, 82 y.o.   MRN: [id number]  01/01/2016  Annual Exam (CPE, no flu vaccine)  By signing my name below, I, Judithe Modest, attest that this documentation has been prepared under the direction and in the presence of Sonia Baller, MD. Electronically Signed: Judithe Modest, ER Scribe. 01/01/2016. 10:15 AM.  HPI HPI Comments: Monica Ward is a 82 y.o. female who presents to [facility] reporting for an annual physical. I saw her on 12/27/14 for a full physical. She no longer needs pap smears or mammograms. Her last colonoscopy was in 2008 and she does not warrant repeat due to age. She refused a bone density scan last year and this year despite hip fracture in fall 2016. She is very worried about weight loss. She has lost 3 lbs since her last visit. Her CBC was normal at last check. Her husband passed away on the 06-29-2022 of 2015-09-20. He was 29 and they had been married for 60 years. She is living alone now, but two of her children live in [place] and one lives in [place]. Her children are over at her house regularly. She had cataract surgery bilaterally in the last year. She does not want a bone density scan. She does not take calcium. She gets calcium at least three times per day. She is continuing to struggle from constipation. She will get her pneumonia and flu shots ar [facility]. She has an appointment with her dentist in a few months. She had a surgery on her hip after she fell and broke her hip on 01/30/15. She has one glass of wine per week. She is not exercising, but she does do all her own house cleaning. She does her own shopping, pays her own bills, and driving. Her son is her power of attorney. She takes vitamin C, aspirin, vitamin E,  Multivitamin, and miralax daily. She stopped taking her statin medication.  She is complaining of feeling cold from her waste down, which is new for her. She usually eats cereal or eggs for  breakfast. She denies HA, dizziness, tinnitus, CP, palpitations, SOB, persistent cough, shoulder pain, numbness tingling or burning in arms, neck pain, swelling in legs. She is having 3-4 BM per week. She occasionally has to massage inside her rectum to get her BM started. She denies blood in stool, nausea, vomiting, abdominal pain, dysuria. She has regular daily bladder incontinence. She goes to bed at 10:30 and wakes up at 3am, is up for an hour and then goes back to sleep and sleeps to 8am. She states she is doing pretty well emotionally considering her husband died.   She has a wound on her left shin after hitting her left shin on a grocery cart three weeks ago.    Depression screen [facility] 2/9 01/01/2016 01/01/2016 04/02/2015 12/27/2014 11/22/2013  Decreased Interest 0 0 0 0 0  Down, Depressed, Hopeless 0 0 0 0 0  PHQ - 2 Score 0 0 0 0 0   Fall Risk  01/01/2016 01/01/2016 12/27/2014 11/22/2013  Falls in the past year? Yes No No No  Number falls in past yr: 1 - - -    Functional Status Survey: Is the patient deaf or have difficulty hearing?: No Does the patient have difficulty seeing, even when wearing glasses/contacts?: No Does the patient have difficulty concentrating, remembering, or making decisions?: No Does the patient have difficulty walking or climbing stairs?:  No Does the patient have difficulty dressing or bathing?: No Does the patient have difficulty doing errands alone such as visiting a doctor's office or shopping?: No   Immunization History  Administered Date(s) Administered  . Influenza Whole 01/27/2012  . Influenza-Unspecified 01/29/2013  . Pneumococcal Conjugate-13 11/22/2013    Review of Systems  Constitutional: Negative for activity change, appetite change, chills, diaphoresis, fatigue, fever and unexpected weight change.  HENT: Negative for congestion, dental problem, drooling, ear discharge, ear pain, facial swelling, hearing loss, mouth sores, nosebleeds, postnasal drip,  rhinorrhea, sinus pressure, sneezing, sore throat, tinnitus, trouble swallowing and voice change.   Eyes: Negative for photophobia, pain, discharge, redness, itching and visual disturbance.  Respiratory: Negative for apnea, cough, choking, chest tightness, shortness of breath, wheezing and stridor.   Cardiovascular: Negative for chest pain, palpitations and leg swelling.  Gastrointestinal: Positive for constipation. Negative for abdominal distention, abdominal pain, anal bleeding, blood in stool, diarrhea, nausea, rectal pain and vomiting.  Endocrine: Negative for cold intolerance, heat intolerance, polydipsia, polyphagia and polyuria.  Genitourinary: Negative for decreased urine volume, difficulty urinating, dyspareunia, dysuria, enuresis, flank pain, frequency, genital sores, hematuria, menstrual problem, pelvic pain, urgency, vaginal bleeding, vaginal discharge and vaginal pain.  Musculoskeletal: Positive for gait problem. Negative for arthralgias, back pain, joint swelling, myalgias, neck pain and neck stiffness.  Skin: Positive for color change and wound. Negative for pallor and rash.  Allergic/Immunologic: Negative for environmental allergies, food allergies and immunocompromised state.  Neurological: Negative for dizziness, tremors, seizures, syncope, facial asymmetry, speech difficulty, weakness, light-headedness, numbness and headaches.  Hematological: Negative for adenopathy. Does not bruise/bleed easily.  Psychiatric/Behavioral: Negative for agitation, behavioral problems, confusion, decreased concentration, dysphoric mood, hallucinations, self-injury, sleep disturbance and suicidal ideas. The patient is not nervous/anxious and is not hyperactive.     Past Medical History:  Diagnosis Date  . Arthritis    "probably" (01/28/2012)  . Cancer ([place]) Around 2002-2003   Unclear history of "7 lb" abdominal tumor treated at [facility] ~2003 s/p open abdominal colonic resection and radiation  .  Complication of anesthesia    "trouble waking up; too much med given" (01/28/2012)  . Constipation 02/03/2014  . Constipation, chronic 02/06/2014  . Diverticulitis    Per chart   . Diverticulosis 02/03/2014  . High cholesterol   . 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 Patient had surgery in 2000 and mass which was presumed to be a leiomyosarcoma rather than a spindle cell cancer. I have no further records of path reports to give me a good definite path diagnosis of her tumor resection.   . Precordial pain 02/12/2012  . SBO (small bowel obstruction) ([place]) 08/2009   Early, partial SBO improved with conservative Tx    Past Surgical History:  Procedure Laterality Date  . ABDOMINAL HYSTERECTOMY  ~ 1985   ovaries resected B; DUB.  Marland Kitchen APPENDECTOMY  1968  . CATARACT EXTRACTION, BILATERAL  09/27/2015  . [place]  . COLECTOMY  2003   "cancer"  . TONSILLECTOMY     "when I was a child"  . TOTAL HIP ARTHROPLASTY Left 01/31/2015   Procedure:  ANTERIOR APPROACH  LEFT HEMI ARTHROPLASTY;  Surgeon: Rod Can, MD;  Location: [facility];  Service: Orthopedics;  Laterality: Left;  . TUMOR REMOVAL  1990's;  X 2   "benign"   No Known Allergies Current Outpatient Prescriptions  Medication Sig Dispense Refill  . Ascorbic Acid (VITAMIN C) 100 MG tablet  Take 100 mg by mouth daily.    Marland Kitchen aspirin EC 325 MG tablet Take 1 tablet (325 mg total) by mouth 2 (two) times daily after a meal. 60 tablet 0  . Cyanocobalamin (VITAMIN B-12 PO) Take 1 tablet by mouth daily.    . Multiple Vitamins-Minerals (MULTIVITAMIN WITH MINERALS) tablet Take 1 tablet by mouth daily.    . Multiple Vitamins-Minerals (ZINC PO) Take 1 tablet by mouth daily.    . Omega-3 Fatty Acids (OMEGA 3 PO) Take 1 capsule by mouth daily.    Marland Kitchen VITAMIN E PO Take 1 tablet by mouth daily.    Marland Kitchen atorvastatin (LIPITOR) 10 MG tablet Take 1 tablet (10 mg total) by mouth daily at 6 PM. (Patient not  taking: Reported on 01/01/2016) 90 tablet 1  . docusate sodium (COLACE) 100 MG capsule Take 1 capsule (100 mg total) by mouth 2 (two) times daily. (Patient not taking: Reported on 01/01/2016) 60 capsule 0  . HYDROcodone-acetaminophen (NORCO) 5-325 MG tablet Take one half tablet every 6 hours as needed for pain (Patient not taking: Reported on 01/01/2016) 20 tablet 0  . mupirocin ointment (BACTROBAN) 2 % Apply 1 application topically daily. 30 g 0  . polyethylene glycol powder (MIRALAX) powder Take 117 g by mouth 2 (two) times daily. (Patient not taking: Reported on 01/01/2016) 255 g 0  . senna (SENOKOT) 8.6 MG TABS tablet Take 1 tablet (8.6 mg total) by mouth 2 (two) times daily. (Patient not taking: Reported on 01/01/2016) 60 each 0   No current facility-administered medications for this visit.    Social History   Social History  . Marital status: Married    Spouse name: N/A  . Number of children: 3  . Years of education: N/A   Occupational History  . retired    Social History Main Topics  . Smoking status: Never Smoker  . Smokeless tobacco: Never Used  . Alcohol use 1.8 oz/week    3 Glasses of wine per week     Comment: social  . Drug use: No  . Sexual activity: Not Currently   Other Topics Concern  . Not on file   Social History Narrative      Marital status: widowed in 08/2015; married x 60 yyears.      Children: 3 children in area; 4 grandchildren; no gg.      Lives: alone in [place].       Employment: Retired      Tobacco: smoked briefly in college      Alcohol: 1 glass of wine per week.      Exercise:  Very little.      ADLs: performs all ADLs; still drives.  No assistant devices.  Cleans townhome.  Does own grocery shopping.  Pay bills.        Advanced Directives:  FULL CODE; no prolonged measures.  HCPOA: son/David Trentman         Family History  Problem Relation Age of Onset  . Heart failure Father 104    Heart failure  . Heart disease Father        Objective:      BP 106/62   Pulse 63   Temp 98.2 F (36.8 C) (Oral)   Resp 18   Ht _0  (1.727 m)   Wt 113 lb 3.2 oz (51.3 kg)   BMI 17.21 kg/m  Physical Exam  Constitutional: She is oriented to person, place, and time. She appears well-developed and well-nourished. No distress.  HENT:  Head: Normocephalic and atraumatic.  Right Ear: External ear normal.  Left Ear: External ear normal.  Nose: Nose normal.  Mouth/Throat: Oropharynx is clear and moist.  Eyes: Conjunctivae and EOM are normal. Pupils are equal, round, and reactive to light.  Neck: Normal range of motion and full passive range of motion without pain. Neck supple. No JVD present. Carotid bruit is not present. No thyromegaly present.  Cardiovascular: Normal rate, regular rhythm and normal heart sounds.  Exam reveals no gallop and no friction rub.   No murmur heard. Pulmonary/Chest: Effort normal and breath sounds normal. No respiratory distress. She has no wheezes. She has no rales.  Abdominal: Soft. Bowel sounds are normal. She exhibits no distension and no mass. There is no tenderness. There is no rebound and no guarding.  Musculoskeletal: Normal range of motion.       Right shoulder: Normal.       Left shoulder: Normal.       Cervical back: Normal.  Lymphadenopathy:    She has no cervical adenopathy.  Neurological: She is alert and oriented to person, place, and time. She has normal reflexes. No cranial nerve deficit. She exhibits normal muscle tone. Coordination normal.  Skin: Skin is warm and dry. No rash noted. She is not diaphoretic. No erythema. No pallor.  1cm ecchymotic abrasion with a central pustule with drainage.   Psychiatric: She has a normal mood and affect. Her behavior is normal. Judgment and thought content normal.  Nursing note and vitals reviewed.  Results for orders placed or performed in visit on 01/01/16  WOUND CULTURE  Result Value Ref Range   Preliminary Report Culture reincubated for better growth   CBC  with Differential/Platelet  Result Value Ref Range   WBC 4.7 3.8 - 10.8 K/uL   RBC 4.46 3.80 - 5.10 MIL/uL   Hemoglobin 13.6 11.7 - 15.5 g/dL   HCT 41.0 35.0 - 45.0 %   MCV 91.9 80.0 - 100.0 fL   MCH 30.5 27.0 - 33.0 pg   MCHC 33.2 32.0 - 36.0 g/dL   RDW 13.8 11.0 - 15.0 %   Platelets 265 140 - 400 K/uL   MPV 10.8 7.5 - 12.5 fL   Neutro Abs 3,008 1,500 - 7,800 cells/uL   Lymphs Abs 1,128 850 - 3,900 cells/uL   Monocytes Absolute 470 200 - 950 cells/uL   Eosinophils Absolute 94 15 - 500 cells/uL   Basophils Absolute 0 0 - 200 cells/uL   Neutrophils Relative % 64 %   Lymphocytes Relative 24 %   Monocytes Relative 10 %   Eosinophils Relative 2 %   Basophils Relative 0 %   Smear Review Criteria for review not met   Comprehensive metabolic panel  Result Value Ref Range   Sodium 141 135 - 146 mmol/L   Potassium 4.0 3.5 - 5.3 mmol/L   Chloride 103 98 - 110 mmol/L   CO2 27 20 - 31 mmol/L   Glucose, Bld 83 65 - 99 mg/dL   BUN 18 7 - 25 mg/dL   Creat 0.63 0.60 - 0.88 mg/dL   Total Bilirubin 0.7 0.2 - 1.2 mg/dL   Alkaline Phosphatase 56 33 - 130 U/L   AST 19 10 - 35 U/L   ALT 16 6 - 29 U/L   Total Protein 7.0 6.1 - 8.1 g/dL   Albumin 4.4 3.6 - 5.1 g/dL   Calcium 9.5 8.6 - 10.4 mg/dL  Lipid panel  Result Value Ref Range  Cholesterol 235 (H) 125 - 200 mg/dL   Triglycerides 69 <150 mg/dL   HDL 82 >=46 mg/dL   Total CHOL/HDL Ratio 2.9 <=5.0 Ratio   VLDL 14 <30 mg/dL   LDL Cholesterol 139 (H) <130 mg/dL  Vitamin B12  Result Value Ref Range   Vitamin B-12 1,100 200 - 1,100 pg/mL  POCT urinalysis dipstick  Result Value Ref Range   Color, UA yellow yellow   Clarity, UA clear clear   Glucose, UA negative negative   Bilirubin, UA negative negative   Ketones, POC UA negative negative   Spec Grav, UA 1.015    Blood, UA trace-lysed (A) negative   pH, UA 5.5    Protein Ur, POC trace (A) negative   Urobilinogen, UA 0.2    Nitrite, UA Negative Negative   Leukocytes, UA Trace (A)  Negative  POCT Microscopic Urinalysis ([facility])  Result Value Ref Range   WBC,UR,HPF,POC Few (A) None WBC/hpf   RBC,UR,HPF,POC None None RBC/hpf   Bacteria None None, Too numerous to count   Mucus Absent Absent   Epithelial Cells, UR Per Microscopy None None, Too numerous to count cells/hpf       Assessment & Plan:   1. Medicare annual wellness visit, subsequent   2. B12 deficiency   3. High cholesterol   4. Thrombocytopenia ([place])   5. Moderate malnutrition ([place])   6. Open wound, lower leg, left, initial encounter    -anticipatory guidance -no evidence of depression despite recent death of husband. -independent with ADLs; moderate to high risk of fall; history of fall in 2016 with hip fracture. -pt refused bone density scan. -has HCPOA; desires FULL CODE. -new wound on leg; wound culture obtained; RTC two weeks for reevaluation and sooner if worse. -pt has stopped statin therapy.   Orders Placed This Encounter  Procedures  . WOUND CULTURE    Order Specific Question:   Source    Answer:   L anterior shin  . CBC with Differential/Platelet  . Comprehensive metabolic panel    Order Specific Question:   Has the patient fasted?    Answer:   Yes  . Lipid panel    Order Specific Question:   Has the patient fasted?    Answer:   Yes  . Vitamin B12  . POCT urinalysis dipstick  . POCT Microscopic Urinalysis ([facility])   Meds ordered this encounter  Medications  . mupirocin ointment (BACTROBAN) 2 %    Sig: Apply 1 application topically daily.    Dispense:  30 g    Refill:  0    Return in about 2 weeks (around 01/15/2016) for recheck LEG WOUND.   I personally performed the services described in this documentation, which was scribed in my presence. The recorded information has been reviewed and considered.  Kristi Elayne Guerin, M.D. [facility] [address] [place], [place]  [address] [phone] phone [phone] fax Dictation  #1 [id number]  [id number]

## 2016-01-01 NOTE — Patient Instructions (Addendum)
  1. Please get your flu vaccine and Pneumovax at [facility].   IF you received an x-ray today, you will receive an invoice from [facility]. Please contact [facility] at [phone] with questions or concerns regarding your invoice.   IF you received labwork today, you will receive an invoice from [employer]. Please contact [facility] at [phone] with questions or concerns regarding your invoice.   Our billing staff will not be able to assist you with questions regarding bills from these companies.  You will be contacted with the lab results as soon as they are available. The fastest way to get your results is to activate your My Chart account. Instructions are located on the last page of this paperwork. If you have not heard from [place] regarding the results in 2 weeks, please contact this office.

## 2016-01-03 ENCOUNTER — Encounter: Payer: Medicare Other | Admitting: Emergency Medicine

## 2016-01-03 LAB — WOUND CULTURE
Gram Stain: NONE SEEN
Gram Stain: NONE SEEN
Organism ID, Bacteria: NORMAL

## 2016-01-15 ENCOUNTER — Telehealth: Payer: Self-pay

## 2016-01-15 NOTE — Telephone Encounter (Signed)
Patient requesting her labs be mailed to her. She cancelled her appt w/Dr. Tamala Julian

## 2016-01-16 ENCOUNTER — Ambulatory Visit: Payer: Medicare Other | Admitting: Family Medicine

## 2016-01-21 ENCOUNTER — Telehealth: Payer: Self-pay

## 2016-01-21 NOTE — Telephone Encounter (Signed)
Attempted to call pt. No answer. Message left.

## 2016-01-21 NOTE — Telephone Encounter (Signed)
Pt called about her lab results on 01-15-16.  The labs were actually done on 01-01-16.  She has not been called or received a letter yet.  Please call and send the letter.  [phone]

## 2016-01-22 NOTE — Telephone Encounter (Signed)
Dr Tamala Julian, can you please review and comment on labs and/or write letter w/remarks to pt? Thank you.

## 2016-02-07 ENCOUNTER — Telehealth: Payer: Self-pay

## 2016-02-07 NOTE — Telephone Encounter (Signed)
Pt is calling about her lab results that was done back in sept and would like a copy mailed to her home address

## 2016-02-07 NOTE — Telephone Encounter (Signed)
Spoke with pt. Advised her that lab results were being mailed today.

## 2016-04-04 ENCOUNTER — Encounter: Payer: Self-pay | Admitting: Family Medicine

## 2016-04-05 ENCOUNTER — Encounter: Payer: Self-pay | Admitting: *Deleted

## 2016-09-30 ENCOUNTER — Encounter: Payer: Self-pay | Admitting: Family Medicine

## 2016-09-30 ENCOUNTER — Ambulatory Visit (INDEPENDENT_AMBULATORY_CARE_PROVIDER_SITE_OTHER): Payer: Medicare Other | Admitting: Family Medicine

## 2016-09-30 VITALS — BP 146/72 | HR 60 | Temp 97.4°F | Resp 16 | Ht 67.99 in | Wt 112.6 lb

## 2016-09-30 DIAGNOSIS — E538 Deficiency of other specified B group vitamins: Secondary | ICD-10-CM | POA: Diagnosis not present

## 2016-09-30 DIAGNOSIS — Z8719 Personal history of other diseases of the digestive system: Secondary | ICD-10-CM | POA: Diagnosis not present

## 2016-09-30 DIAGNOSIS — M810 Age-related osteoporosis without current pathological fracture: Secondary | ICD-10-CM

## 2016-09-30 DIAGNOSIS — Z0000 Encounter for general adult medical examination without abnormal findings: Secondary | ICD-10-CM | POA: Diagnosis not present

## 2016-09-30 DIAGNOSIS — D696 Thrombocytopenia, unspecified: Secondary | ICD-10-CM | POA: Diagnosis not present

## 2016-09-30 DIAGNOSIS — K5909 Other constipation: Secondary | ICD-10-CM | POA: Diagnosis not present

## 2016-09-30 DIAGNOSIS — Z8781 Personal history of (healed) traumatic fracture: Secondary | ICD-10-CM

## 2016-09-30 DIAGNOSIS — E7800 Pure hypercholesterolemia, unspecified: Secondary | ICD-10-CM | POA: Diagnosis not present

## 2016-09-30 DIAGNOSIS — F4329 Adjustment disorder with other symptoms: Secondary | ICD-10-CM

## 2016-09-30 DIAGNOSIS — F4381 Prolonged grief disorder: Secondary | ICD-10-CM

## 2016-09-30 MED ORDER — ALENDRONATE SODIUM 70 MG PO TABS: 70.0000 mg | ORAL_TABLET | ORAL | 3 refills | Status: DC

## 2016-09-30 NOTE — Patient Instructions (Addendum)
I recommend the PNEUMOVAX. You can receive at [facility].      IF you received an x-ray today, you will receive an invoice from [facility]. Please contact [facility] at [phone] with questions or concerns regarding your invoice.   IF you received labwork today, you will receive an invoice from [place]. Please contact [facility] at [phone] with questions or concerns regarding your invoice.   Our billing staff will not be able to assist you with questions regarding bills from these companies.  You will be contacted with the lab results as soon as they are available. The fastest way to get your results is to activate your My Chart account. Instructions are located on the last page of this paperwork. If you have not heard from [place] regarding the results in 2 weeks, please contact this office.      Alendronate weekly tablets What is this medicine? ALENDRONATE (a LEN droe nate) slows calcium loss from bones. It helps to make healthy bone and to slow bone loss in people with osteoporosis. It may be used to treat Paget's disease. This medicine may be used for other purposes; ask your health care provider or pharmacist if you have questions. COMMON BRAND NAME(S): Fosamax What should I tell my health care provider before I take this medicine? They need to know if you have any of these conditions: -esophagus, stomach, or intestine problems, like acid-reflux or GERD -dental disease -kidney disease -low blood calcium -low vitamin D -problems swallowing -problems sitting or standing for 30 minutes -an unusual or allergic reaction to alendronate, other medicines, foods, dyes, or preservatives -pregnant or trying to get pregnant -breast-feeding How should I use this medicine? You must take this medicine exactly as directed or you will lower the amount of medicine you absorb into your body or you may cause yourself harm. Take your dose by mouth first thing in the  morning, after you are up for the day. Do not eat or drink anything before you take this medicine. Swallow your medicine with a full glass (6 to 8 fluid ounces) of plain water. Do not take this tablet with any other drink. Do not chew or crush the tablet. After taking this medicine, do not eat breakfast, drink, or take any medicines or vitamins for at least 30 minutes. Stand or sit up for at least 30 minutes after you take this medicine; do not lie down. Take this medicine on the same day every week. Do not take your medicine more often than directed. Talk to your pediatrician regarding the use of this medicine in children. Special care may be needed. Overdosage: If you think you have taken too much of this medicine contact a poison control center or emergency room at once. NOTE: This medicine is only for you. Do not share this medicine with others. What if I miss a dose? If you miss a dose, take the dose on the morning after you remember. Then take your next dose on your regular day of the week. Never take 2 tablets on the same day. Do not take double or extra doses. What may interact with this medicine? -aluminum hydroxide -antacids -aspirin -calcium supplements -drugs for inflammation like ibuprofen, naproxen, and others -iron supplements -magnesium supplements -vitamins with minerals This list may not describe all possible interactions. Give your health care provider a list of all the medicines, herbs, non-prescription drugs, or dietary supplements you use. Also tell them if you smoke, drink alcohol, or use illegal drugs. Some items  may interact with your medicine. What should I watch for while using this medicine? Visit your doctor or health care professional for regular checks ups. It may be some time before you see benefit from this medicine. Do not stop taking your medication except on your doctor's advice. Your doctor or health care professional may order blood tests and other tests to see  how you are doing. You should make sure you get enough calcium and vitamin D while you are taking this medicine, unless your doctor tells you not to. Discuss the foods you eat and the vitamins you take with your health care professional. Some people who take this medicine have severe bone, joint, and/or muscle pain. This medicine may also increase your risk for a broken thigh bone. Tell your doctor right away if you have pain in your upper leg or groin. Tell your doctor if you have any pain that does not go away or that gets worse. This medicine can make you more sensitive to the sun. If you get a rash while taking this medicine, sunlight may cause the rash to get worse. Keep out of the sun. If you cannot avoid being in the sun, wear protective clothing and use sunscreen. Do not use sun lamps or tanning beds/booths. What side effects may I notice from receiving this medicine? Side effects that you should report to your doctor or health care professional as soon as possible: -allergic reactions like skin rash, itching or hives, swelling of the face, lips, or tongue -black or tarry stools -bone, muscle or joint pain -changes in vision -chest pain -heartburn or stomach pain -jaw pain, especially after dental work -pain or trouble when swallowing -redness, blistering, peeling or loosening of the skin, including inside the mouth Side effects that usually do not require medical attention (report to your doctor or health care professional if they continue or are bothersome): -changes in taste -diarrhea or constipation -eye pain or itching -headache -nausea or vomiting -stomach gas or fullness This list may not describe all possible side effects. Call your doctor for medical advice about side effects. You may report side effects to FDA at 1-800-FDA-1088. Where should I keep my medicine? Keep out of the reach of children. Store at room temperature of 15 and 30 degrees C (59 and 86 degrees F). Throw  away any unused medicine after the expiration date. NOTE: This sheet is a summary. It may not cover all possible information. If you have questions about this medicine, talk to your doctor, pharmacist, or health care provider.  2018 Elsevier/Gold Standard (2011-02-27 09:02:42)

## 2016-09-30 NOTE — Progress Notes (Signed)
Subjective:    Patient ID: Monica Ward, female    DOB: [birth year], 83 y.o.   MRN: [id number]  09/30/2016  Annual Exam   HPI This 83 y.o. female presents for Annual Wellness Examination, Complete Physical Examination, and six month follow-up of hypercholesterolemia, urinary incontinence, constipation.   Eating two to three meals per day.   B: cereals, fruit/blueberries, bananas, oranges or omelet. Supper: meat/fish, vegetables.  Arthritis:  Stiffness; back is stiff; hips are stiff; hip fracture two years ago.     Immunization History  Administered Date(s) Administered  . Influenza Whole 01/27/2012  . Influenza-Unspecified 01/29/2013, 01/29/2016  . Pneumococcal Conjugate-13 11/22/2013   BP Readings from Last 3 Encounters:  09/30/16 (!) 146/72  01/01/16 106/62  04/02/15 122/80   Wt Readings from Last 3 Encounters:  09/30/16 112 lb 9.6 oz (51.1 kg)  01/01/16 113 lb 3.2 oz (51.3 kg)  04/02/15 116 lb (52.6 kg)    Review of Systems  Constitutional: Negative for activity change, appetite change, chills, diaphoresis, fatigue, fever and unexpected weight change.  HENT: Negative for congestion, dental problem, drooling, ear discharge, ear pain, facial swelling, hearing loss, mouth sores, nosebleeds, postnasal drip, rhinorrhea, sinus pressure, sneezing, sore throat, tinnitus, trouble swallowing and voice change.   Eyes: Negative for photophobia, pain, discharge, redness, itching and visual disturbance.  Respiratory: Negative for apnea, cough, choking, chest tightness, shortness of breath, wheezing and stridor.   Cardiovascular: Negative for chest pain, palpitations and leg swelling.  Gastrointestinal: Negative for abdominal distention, abdominal pain, anal bleeding, blood in stool, constipation, diarrhea, nausea, rectal pain and vomiting.  Endocrine: Negative for cold intolerance, heat intolerance, polydipsia, polyphagia and polyuria.  Genitourinary: Negative for decreased urine  volume, difficulty urinating, dyspareunia, dysuria, enuresis, flank pain, frequency, genital sores, hematuria, menstrual problem, pelvic pain, urgency, vaginal bleeding, vaginal discharge and vaginal pain.  Musculoskeletal: Negative for arthralgias, back pain, gait problem, joint swelling, myalgias, neck pain and neck stiffness.  Skin: Negative for color change, pallor, rash and wound.  Allergic/Immunologic: Negative for environmental allergies, food allergies and immunocompromised state.  Neurological: Negative for dizziness, tremors, seizures, syncope, facial asymmetry, speech difficulty, weakness, light-headedness, numbness and headaches.  Hematological: Negative for adenopathy. Does not bruise/bleed easily.  Psychiatric/Behavioral: Negative for agitation, behavioral problems, confusion, decreased concentration, dysphoric mood, hallucinations, self-injury, sleep disturbance and suicidal ideas. The patient is not nervous/anxious and is not hyperactive.     Past Medical History:  Diagnosis Date  . Arthritis    "probably" (01/28/2012)  . Cancer ([place]) Around 2002-2003   Unclear history of "7 lb" abdominal tumor treated at [facility] ~2003 s/p open abdominal colonic resection and radiation  . Complication of anesthesia    "trouble waking up; too much med given" (01/28/2012)  . Constipation 02/03/2014  . Constipation, chronic 02/06/2014  . Diverticulitis    Per chart   . Diverticulosis 02/03/2014  . High cholesterol   . 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 Patient had surgery in 2000 and mass which was presumed to be a leiomyosarcoma rather than a spindle cell cancer. I have no further records of path reports to give me a good definite path diagnosis of her tumor resection.   . Precordial pain 02/12/2012  . SBO (small bowel obstruction) ([place]) 08/2009   Early, partial SBO improved with conservative Tx    Past Surgical History:  Procedure Laterality Date   . ABDOMINAL HYSTERECTOMY  ~ 1985   ovaries resected B; DUB.  [facility]  APPENDECTOMY  1968  . CATARACT EXTRACTION, BILATERAL  09/27/2015  . [place]  . COLECTOMY  2003   "cancer"  . TONSILLECTOMY     "when I was a child"  . TOTAL HIP ARTHROPLASTY Left 01/31/2015   Procedure:  ANTERIOR APPROACH  LEFT HEMI ARTHROPLASTY;  Surgeon: Rod Can, MD;  Location: [facility];  Service: Orthopedics;  Laterality: Left;  . TUMOR REMOVAL  1990's;  X 2   "benign"   No Known Allergies  Social History   Social History  . Marital status: Married    Spouse name: N/A  . Number of children: 3  . Years of education: N/A   Occupational History  . retired    Social History Main Topics  . Smoking status: Never Smoker  . Smokeless tobacco: Never Used  . Alcohol use 1.8 oz/week    3 Glasses of wine per week     Comment: social  . Drug use: No  . Sexual activity: Not Currently   Other Topics Concern  . Not on file   Social History Narrative      Marital status: widowed in 08/2015; married x 60 yyears.      Children: 3 children in area; 4 grandchildren; no gg.      Lives: alone in [place].       Employment: Retired      Tobacco: smoked briefly in college      Alcohol: 1-2 glass of wine per week.      Exercise:  Very little in 2018; cleans house weekly; takes care garden in back and flowers.      ADLs: performs all ADLs; still drives.  No assistant devices.  Cleans townhome.  Does own grocery shopping.  Pay bills.        Advanced Directives:  FULL CODE; no prolonged measures.  HCPOA: son/David Paulhus         Family History  Problem Relation Age of Onset  . Heart failure Father 31       Heart failure  . Heart disease Father        Objective:    BP (!) 146/72 (BP Location: Right Arm, Patient Position: Sitting, Cuff Size: Small)   Pulse 60   Temp 97.4 F (36.3 C) (Oral)   Resp 16   Ht 5' 7.99" (1.727 m)   Wt 112 lb 9.6 oz (51.1 kg)   SpO2 96%   BMI 17.12 kg/m  Physical  Exam  Constitutional: She is oriented to person, place, and time. She appears well-developed and well-nourished. No distress.  HENT:  Head: Normocephalic and atraumatic.  Right Ear: External ear normal.  Left Ear: External ear normal.  Nose: Nose normal.  Mouth/Throat: Oropharynx is clear and moist.  Eyes: Conjunctivae and EOM are normal. Pupils are equal, round, and reactive to light.  Neck: Normal range of motion. Neck supple. Carotid bruit is not present. No thyromegaly present.  Cardiovascular: Normal rate, regular rhythm, normal heart sounds and intact distal pulses.  Exam reveals no gallop and no friction rub.   No murmur heard. Pulmonary/Chest: Effort normal and breath sounds normal. She has no wheezes. She has no rales.  Abdominal: Soft. Bowel sounds are normal. She exhibits no distension and no mass. There is no tenderness. There is no rebound and no guarding.  Musculoskeletal:  Slowed gait; head forward; unable to lift from chair without assistance of upper arms.  Lymphadenopathy:    She has no cervical adenopathy.  Neurological: She  is alert and oriented to person, place, and time. No cranial nerve deficit.  Skin: Skin is warm and dry. No rash noted. She is not diaphoretic. No erythema. No pallor.  Psychiatric: She has a normal mood and affect. Her behavior is normal.   Depression screen [facility] 2/9 09/30/2016 01/01/2016 01/01/2016 04/02/2015 12/27/2014  Decreased Interest 0 0 0 0 0  Down, Depressed, Hopeless 0 0 0 0 0  PHQ - 2 Score 0 0 0 0 0   Fall Risk  09/30/2016 01/01/2016 01/01/2016 12/27/2014 11/22/2013  Falls in the past year? No Yes No No No  Number falls in past yr: - 1 - - -   Functional Status Survey: Is the patient deaf or have difficulty hearing?: No Does the patient have difficulty seeing, even when wearing glasses/contacts?: No Does the patient have difficulty concentrating, remembering, or making decisions?: No Does the patient have difficulty walking or climbing stairs?:  Yes Does the patient have difficulty dressing or bathing?: No Does the patient have difficulty doing errands alone such as visiting a doctor's office or shopping?: No  MMSE - [place] Exam 01/01/2016  Orientation to time 5  Orientation to Place 5  Registration 3  Attention/ Calculation 5  Recall 3  Language- name 2 objects 2  Language- repeat 1  Language- follow 3 step command 3  Language- read & follow direction 1  Write a sentence 1  Copy design 1  Total score 30       Assessment & Plan:   1. Encounter for Medicare annual wellness exam   2. Routine physical examination   3. High cholesterol   4. Thrombocytopenia ([place])   5. B12 deficiency   6. Constipation, chronic   7. Prolonged grief disorder   8. History of hip fracture   9. History of small bowel obstruction   10. Age-related osteoporosis without current pathological fracture    -anticipatory guidance provided --- exercise, weight loss, safe driving practices, aspirin 81mg  daily. -obtain age appropriate screening labs and labs for chronic disease management. -moderate fall risk; no evidence of depression; no evidence of hearing loss.  Discussed advanced directives and living will; also discussed end of life issues including code status.  -history of hip fracture; pt refuses bone density scan; will consider Fosamax; rx provided; recommend exercise and 1200 kcal of calcium and 1000 IU of vitamin D.  -continues to grieve the death of husband; pt refuses trial of antidepressant and insists that not depressed; declined recommendation for psychotherapy; good local support from children. -mobility has declined in the past year; recommend physical therapy yet pt declines.  Continues to clean own home weekly.       Orders Placed This Encounter  Procedures  . CBC with Differential/Platelet  . Comprehensive metabolic panel    Order Specific Question:   Has the patient fasted?    Answer:   Yes  . Lipid panel    Order  Specific Question:   Has the patient fasted?    Answer:   Yes  . Vitamin B12  . Care order/instruction:    Scheduling Instructions:     Perform mini mental status exam   Meds ordered this encounter  Medications  . alendronate (FOSAMAX) 70 MG tablet    Sig: Take 1 tablet (70 mg total) by mouth every 7 (seven) days. Take with a full glass of water on an empty stomach.    Dispense:  12 tablet    Refill:  3  Return in about 6 months (around 04/01/2017) for recheck.   Kristi Elayne Guerin, M.D. Primary Care at [facility] [place] [address] [place], [place]  [address] [phone] phone [phone] fax

## 2016-10-01 LAB — COMPREHENSIVE METABOLIC PANEL
ALT: 13 IU/L (ref 0–32)
AST: 19 IU/L (ref 0–40)
Albumin/Globulin Ratio: 2 (ref 1.2–2.2)
Albumin: 4.7 g/dL (ref 3.5–4.7)
Alkaline Phosphatase: 60 IU/L (ref 39–117)
BUN/Creatinine Ratio: 27 (ref 12–28)
BUN: 16 mg/dL (ref 8–27)
Bilirubin Total: 0.5 mg/dL (ref 0.0–1.2)
CO2: 26 mmol/L (ref 18–29)
Calcium: 9.5 mg/dL (ref 8.7–10.3)
Chloride: 104 mmol/L (ref 96–106)
Creatinine, Ser: 0.6 mg/dL (ref 0.57–1.00)
GFR calc Af Amer: 96 mL/min/{1.73_m2} (ref >59)
GFR calc non Af Amer: 83 mL/min/{1.73_m2} (ref >59)
Globulin, Total: 2.4 g/dL (ref 1.5–4.5)
Glucose: 89 mg/dL (ref 65–99)
Potassium: 4.4 mmol/L (ref 3.5–5.2)
Sodium: 144 mmol/L (ref 134–144)
Total Protein: 7.1 g/dL (ref 6.0–8.5)

## 2016-10-01 LAB — CBC WITH DIFFERENTIAL/PLATELET
Basophils Absolute: 0 10*3/uL (ref 0.0–0.2)
Basos: 0 %
EOS (ABSOLUTE): 0.1 10*3/uL (ref 0.0–0.4)
Eos: 2 %
Hematocrit: 44.5 % (ref 34.0–46.6)
Hemoglobin: 14.5 g/dL (ref 11.1–15.9)
Immature Grans (Abs): 0 10*3/uL (ref 0.0–0.1)
Immature Granulocytes: 0 %
Lymphocytes Absolute: 1.3 10*3/uL (ref 0.7–3.1)
Lymphs: 27 %
MCH: 31.5 pg (ref 26.6–33.0)
MCHC: 32.6 g/dL (ref 31.5–35.7)
MCV: 97 fL (ref 79–97)
Monocytes Absolute: 0.4 10*3/uL (ref 0.1–0.9)
Monocytes: 8 %
Neutrophils Absolute: 3.1 10*3/uL (ref 1.4–7.0)
Neutrophils: 63 %
Platelets: 264 10*3/uL (ref 150–379)
RBC: 4.6 x10E6/uL (ref 3.77–5.28)
RDW: 13.6 % (ref 12.3–15.4)
WBC: 5 10*3/uL (ref 3.4–10.8)

## 2016-10-01 LAB — LIPID PANEL
Chol/HDL Ratio: 3.1 ratio (ref 0.0–4.4)
Cholesterol, Total: 236 mg/dL — ABNORMAL HIGH (ref 100–199)
HDL: 76 mg/dL (ref >39)
LDL Calculated: 147 mg/dL — ABNORMAL HIGH (ref 0–99)
Triglycerides: 63 mg/dL (ref 0–149)
VLDL Cholesterol Cal: 13 mg/dL (ref 5–40)

## 2016-10-01 LAB — VITAMIN B12: Vitamin B-12: 1079 pg/mL (ref 232–1245)

## 2016-10-02 DIAGNOSIS — F4329 Adjustment disorder with other symptoms: Secondary | ICD-10-CM | POA: Insufficient documentation

## 2016-10-02 DIAGNOSIS — M810 Age-related osteoporosis without current pathological fracture: Secondary | ICD-10-CM | POA: Insufficient documentation

## 2016-10-02 DIAGNOSIS — Z8781 Personal history of (healed) traumatic fracture: Secondary | ICD-10-CM | POA: Insufficient documentation

## 2016-10-02 DIAGNOSIS — Z8719 Personal history of other diseases of the digestive system: Secondary | ICD-10-CM | POA: Insufficient documentation

## 2016-10-02 DIAGNOSIS — F4381 Prolonged grief disorder: Secondary | ICD-10-CM | POA: Insufficient documentation

## 2016-10-20 ENCOUNTER — Encounter: Payer: Self-pay | Admitting: Family Medicine

## 2016-10-21 NOTE — Progress Notes (Signed)
Letter lab results sent

## 2017-04-14 ENCOUNTER — Ambulatory Visit (INDEPENDENT_AMBULATORY_CARE_PROVIDER_SITE_OTHER): Payer: Medicare Other | Admitting: Emergency Medicine

## 2017-04-14 ENCOUNTER — Encounter: Payer: Self-pay | Admitting: Emergency Medicine

## 2017-04-14 VITALS — BP 104/64 | HR 86 | Temp 97.9°F | Resp 17 | Ht 68.0 in | Wt 111.0 lb

## 2017-04-14 DIAGNOSIS — R1084 Generalized abdominal pain: Secondary | ICD-10-CM | POA: Diagnosis not present

## 2017-04-14 DIAGNOSIS — K5909 Other constipation: Secondary | ICD-10-CM

## 2017-04-14 MED ORDER — HYOSCYAMINE SULFATE SL 0.125 MG SL SUBL: 1.0000 | SUBLINGUAL_TABLET | Freq: Four times a day (QID) | SUBLINGUAL | 3 refills | Status: DC | PRN

## 2017-04-14 NOTE — Patient Instructions (Addendum)
     IF you received an x-ray today, you will receive an invoice from [facility]. Please contact [facility] at [phone] with questions or concerns regarding your invoice.   IF you received labwork today, you will receive an invoice from [facility]. Please contact [facility] at [phone] with questions or concerns regarding your invoice.   Our billing staff will not be able to assist you with questions regarding bills from these companies.  You will be contacted with the lab results as soon as they are available. The fastest way to get your results is to activate your My Chart account. Instructions are located on the last page of this paperwork. If you have not heard from us regarding the results in 2 weeks, please contact this office.     Abdominal Pain, Adult Many things can cause belly (abdominal) pain. Most times, belly pain is not dangerous. Many cases of belly pain can be watched and treated at home. Sometimes belly pain is serious, though. Your doctor will try to find the cause of your belly pain. Follow these instructions at home:  Take over-the-counter and prescription medicines only as told by your doctor. Do not take medicines that help you poop (laxatives) unless told to by your doctor.  Drink enough fluid to keep your pee (urine) clear or pale yellow.  Watch your belly pain for any changes.  Keep all follow-up visits as told by your doctor. This is important. Contact a doctor if:  Your belly pain changes or gets worse.  You are not hungry, or you lose weight without trying.  You are having trouble pooping (constipated) or have watery poop (diarrhea) for more than 2-3 days.  You have pain when you pee or poop.  Your belly pain wakes you up at night.  Your pain gets worse with meals, after eating, or with certain foods.  You are throwing up and cannot keep anything down.  You have a fever. Get help right away if:  Your pain does not go away  as soon as your doctor says it should.  You cannot stop throwing up.  Your pain is only in areas of your belly, such as the right side or the left lower part of the belly.  You have bloody or black poop, or poop that looks like tar.  You have very bad pain, cramping, or bloating in your belly.  You have signs of not having enough fluid or water in your body (dehydration), such as:  Dark pee, very little pee, or no pee.  Cracked lips.  Dry mouth.  Sunken eyes.  Sleepiness.  Weakness. This information is not intended to replace advice given to you by your health care provider. Make sure you discuss any questions you have with your health care provider. Document Released: 10/01/2007 Document Revised: 11/02/2015 Document Reviewed: 09/26/2015 Elsevier Interactive Patient Education  2017 Elsevier Inc.  

## 2017-04-14 NOTE — Progress Notes (Signed)
Monica Ward 83 y.o.   Chief Complaint  Patient presents with  . Abdominal Pain    HISTORY OF PRESENT ILLNESS: This is a 83 y.o. female complaining of colicky left sided lower abdominal pain since last night; episodes last about 15 minutes and then go away spontaneously; sharp pain without n/v, diarrhea, fever; has BM every 3 days. Has h/o multiple abdominal surgeries in the past.  HPI   Prior to Admission medications   Medication Sig Start Date End Date Taking? Authorizing Provider  Ascorbic Acid (VITAMIN C) 100 MG tablet Take 100 mg by mouth daily.   Yes [provider]  aspirin EC 81 MG tablet Take 81 mg by mouth daily.   Yes [provider]  Cyanocobalamin (VITAMIN B-12 PO) Take 1 tablet by mouth daily.   Yes [provider]  Multiple Vitamins-Minerals (MULTIVITAMIN WITH MINERALS) tablet Take 1 tablet by mouth daily.   Yes [provider]  Omega-3 Fatty Acids (OMEGA 3 PO) Take 1 capsule by mouth daily.   Yes [provider]  VITAMIN E PO Take 1 tablet by mouth daily.   Yes [provider]  alendronate (FOSAMAX) 70 MG tablet Take 1 tablet (70 mg total) by mouth every 7 (seven) days. Take with a full glass of water on an empty stomach. Patient not taking: Reported on 04/14/2017 09/30/16   Wardell Honour, MD  Multiple Vitamins-Minerals (ZINC PO) Take 1 tablet by mouth daily.    [provider]    No Known Allergies  Patient Active Problem List   Diagnosis Date Noted  . History of hip fracture 10/02/2016  . 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 History of small bowel obstruction 10/02/2016  . Age-related osteoporosis without current pathological fracture 10/02/2016  . Moderate malnutrition ([place]) 02/05/2015  . Thrombocytopenia ([place]) 02/02/2015  . High cholesterol   . Constipation, chronic 02/06/2014  . Diverticulosis 02/03/2014  . B12 deficiency 02/03/2014    Past Medical History:  Diagnosis Date  .  Arthritis    "probably" (01/28/2012)  . Cancer ([place]) Around 2002-2003   Unclear history of "7 lb" abdominal tumor treated at [facility] ~2003 s/p open abdominal colonic resection and radiation  . Complication of anesthesia    "trouble waking up; too much med given" (01/28/2012)  . Constipation 02/03/2014  . Constipation, chronic 02/06/2014  . Diverticulitis    Per chart   . Diverticulosis 02/03/2014  . High cholesterol   . 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 Patient had surgery in 2000 and mass which was presumed to be a leiomyosarcoma rather than a spindle cell cancer. I have no further records of path reports to give me a good definite path diagnosis of her tumor resection.   . Precordial pain 02/12/2012  . SBO (small bowel obstruction) ([place]) 08/2009   Early, partial SBO improved with conservative Tx     Past Surgical History:  Procedure Laterality Date  . ABDOMINAL HYSTERECTOMY  ~ 1985   ovaries resected B; DUB.  Marland Kitchen APPENDECTOMY  1968  . CATARACT EXTRACTION, BILATERAL  09/27/2015  . [place]  . COLECTOMY  2003   "cancer"  . TONSILLECTOMY     "when I was a child"  . TOTAL HIP ARTHROPLASTY Left 01/31/2015   Procedure:  ANTERIOR APPROACH  LEFT HEMI ARTHROPLASTY;  Surgeon: Rod Can, MD;  Location: [facility];  Service: Orthopedics;  Laterality: Left;  . TUMOR REMOVAL  1990's;  X 2   "benign"    Social History   Socioeconomic History  . Marital status: Married    Spouse name: Not on file  . Number of children: 3  . Years of education: Not on file  . Highest education level: Not on file  Social Needs  . Financial resource strain: Not on file  . Food insecurity - worry: Not on file  . Food insecurity - inability: Not on file  . Transportation needs - medical: Not on file  . Transportation needs - non-medical: Not on file  Occupational History  . Occupation: retired  Tobacco Use  . Smoking status: Never Smoker  .  Smokeless tobacco: Never Used  Substance and Sexual Activity  . Alcohol use: Yes    Alcohol/week: 1.8 oz    Types: 3 Glasses of wine per week    Comment: social  . Drug use: No  . Sexual activity: Not Currently  Other Topics Concern  . Not on file  Social History Narrative      Marital status: widowed in 08/2015; married x 60 yyears.      Children: 3 children in area; 4 grandchildren; no gg.      Lives: alone in [place].       Employment: Retired      Tobacco: smoked briefly in college      Alcohol: 1-2 glass of wine per week.      Exercise:  Very little in 2018; cleans house weekly; takes care garden in back and flowers.      ADLs: performs all ADLs; still drives.  No assistant devices.  Cleans townhome.  Does own grocery shopping.  Pay bills.        Advanced Directives:  FULL CODE; no prolonged measures.  HCPOA: son/David Hammitt          Family History  Problem Relation Age of Onset  . Heart failure Father 28       Heart failure  . Heart disease Father      Review of Systems  Constitutional: Negative.  Negative for chills and fever.  HENT: Negative for sore throat.   Eyes: Negative.  Negative for discharge and redness.  Respiratory: Negative for cough and shortness of breath.   Cardiovascular: Negative for chest pain and palpitations.  Gastrointestinal: Positive for abdominal pain and constipation. Negative for blood in stool, diarrhea, melena, nausea and vomiting.  Genitourinary: Negative.  Negative for dysuria and hematuria.  Musculoskeletal: Negative for back pain, myalgias and neck pain.  Skin: Negative.  Negative for rash.  Neurological: Negative for dizziness and headaches.  Endo/Heme/Allergies: Negative.   All other systems reviewed and are negative.   Vitals:   04/14/17 1428  BP: 104/64  Pulse: 86  Resp: 17  Temp: 97.9 F (36.6 C)  SpO2: 98%    Physical Exam  Constitutional: She is oriented to person, place, and time. She appears well-developed  and well-nourished.  HENT:  Head: Normocephalic and atraumatic.  Eyes: EOM are normal. Pupils are equal, round, and reactive to light.  Neck: Normal range of motion. Neck supple.  Cardiovascular: Normal rate, regular rhythm and normal heart sounds.  Pulmonary/Chest: Effort normal and breath sounds normal.  Abdominal: Soft. Normal appearance and bowel sounds are normal. She exhibits no distension, no ascites, no pulsatile midline mass and no mass. There is no hepatosplenomegaly. There is no tenderness. There is no rigidity, no rebound, no guarding and no CVA tenderness. No hernia.  Multiple surgical scars  Musculoskeletal:  Normal range of motion.  Neurological: She is alert and oriented to person, place, and time. No sensory deficit. She exhibits normal muscle tone.  Skin: Skin is warm and dry. Capillary refill takes less than 2 seconds. No rash noted.  Psychiatric: She has a normal mood and affect. Her behavior is normal.  Vitals reviewed.    ASSESSMENT & PLAN: Tranesha was seen today for abdominal pain.  Diagnoses and all orders for this visit:  Generalized abdominal pain -     Hyoscyamine Sulfate SL (LEVSIN/SL) 0.125 MG SUBL; Place 1 tablet under the tongue every 6 (six) hours as needed.  Constipation, chronic     Patient Instructions       IF you received an x-ray today, you will receive an invoice from [facility]. Please contact [facility] at [phone] with questions or concerns regarding your invoice.   IF you received labwork today, you will receive an invoice from [place]. Please contact [facility] at [phone] with questions or concerns regarding your invoice.   Our billing staff will not be able to assist you with questions regarding bills from these companies.  You will be contacted with the lab results as soon as they are available. The fastest way to get your results is to activate your My Chart account. Instructions are located on the  last page of this paperwork. If you have not heard from [place] regarding the results in 2 weeks, please contact this office.      Abdominal Pain, Adult Many things can cause belly (abdominal) pain. Most times, belly pain is not dangerous. Many cases of belly pain can be watched and treated at home. Sometimes belly pain is serious, though. Your doctor will try to find the cause of your belly pain. Follow these instructions at home:  Take over-the-counter and prescription medicines only as told by your doctor. Do not take medicines that help you poop (laxatives) unless told to by your doctor.  Drink enough fluid to keep your pee (urine) clear or pale yellow.  Watch your belly pain for any changes.  Keep all follow-up visits as told by your doctor. This is important. Contact a doctor if:  Your belly pain changes or gets worse.  You are not hungry, or you lose weight without trying.  You are having trouble pooping (constipated) or have watery poop (diarrhea) for more than 2-3 days.  You have pain when you pee or poop.  Your belly pain wakes you up at night.  Your pain gets worse with meals, after eating, or with certain foods.  You are throwing up and cannot keep anything down.  You have a fever. Get help right away if:  Your pain does not go away as soon as your doctor says it should.  You cannot stop throwing up.  Your pain is only in areas of your belly, such as the right side or the left lower part of the belly.  You have bloody or black poop, or poop that looks like tar.  You have very bad pain, cramping, or bloating in your belly.  You have signs of not having enough fluid or water in your body (dehydration), such as: ? Dark pee, very little pee, or no pee. ? Cracked lips. ? Dry mouth. ? Sunken eyes. ? Sleepiness. ? Weakness. This information is not intended to replace advice given to you by your health care provider. Make sure you discuss any questions you have with  your health care provider. Document Released: 10/01/2007  Document Revised: 11/02/2015 Document Reviewed: 09/26/2015 Elsevier Interactive Patient Education  2017 Elsevier Inc.      Agustina Caroli, MD [facility]

## 2017-09-23 ENCOUNTER — Encounter: Payer: Self-pay | Admitting: Family Medicine

## 2017-09-29 ENCOUNTER — Other Ambulatory Visit: Payer: Self-pay

## 2017-09-29 ENCOUNTER — Encounter: Payer: Self-pay | Admitting: Emergency Medicine

## 2017-09-29 ENCOUNTER — Ambulatory Visit: Payer: Self-pay

## 2017-09-29 ENCOUNTER — Ambulatory Visit: Payer: Medicare Other | Admitting: Emergency Medicine

## 2017-09-29 VITALS — BP 120/60 | HR 67 | Temp 97.8°F | Resp 16 | Ht 68.0 in | Wt 109.8 lb

## 2017-09-29 DIAGNOSIS — Z23 Encounter for immunization: Secondary | ICD-10-CM | POA: Diagnosis not present

## 2017-09-29 DIAGNOSIS — S61442A Puncture wound with foreign body of left hand, initial encounter: Secondary | ICD-10-CM

## 2017-09-29 NOTE — Progress Notes (Signed)
Monica Ward 84 y.o.   Chief Complaint  Patient presents with  . foreign object    LEFT hand x 2 days, per patient wants it removed    HISTORY OF PRESENT ILLNESS: This is a 84 y.o. female presenting with a puncture wound to her left hand sustained several days ago.  Denies pain.  Needs tetanus shot.  Has some redness and mild swelling.  HPI   Prior to Admission medications   Medication Sig Start Date End Date Taking? Authorizing Provider  Ascorbic Acid (VITAMIN C) 100 MG tablet Take 100 mg by mouth daily.   Yes [provider]  aspirin EC 81 MG tablet Take 81 mg by mouth daily.   Yes [provider]  Cyanocobalamin (VITAMIN B-12 PO) Take 1 tablet by mouth daily.   Yes [provider]  Multiple Vitamins-Minerals (MULTIVITAMIN WITH MINERALS) tablet Take 1 tablet by mouth daily.   Yes [provider]  Multiple Vitamins-Minerals (ZINC PO) Take 1 tablet by mouth daily.   Yes [provider]  Omega-3 Fatty Acids (OMEGA 3 PO) Take 1 capsule by mouth daily.   Yes [provider]  VITAMIN E PO Take 1 tablet by mouth daily.   Yes [provider]  alendronate (FOSAMAX) 70 MG tablet Take 1 tablet (70 mg total) by mouth every 7 (seven) days. Take with a full glass of water on an empty stomach. Patient not taking: Reported on 04/14/2017 09/30/16   Wardell Honour, MD  Hyoscyamine Sulfate SL (LEVSIN/SL) 0.125 MG SUBL Place 1 tablet under the tongue every 6 (six) hours as needed. Patient not taking: Reported on 09/29/2017 04/14/17   Horald Pollen, MD    No Known Allergies  Patient Active Problem List   Diagnosis Date Noted  . History of hip fracture 10/02/2016  . 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 History of small bowel obstruction 10/02/2016  . Age-related osteoporosis without current pathological fracture 10/02/2016  . Moderate malnutrition ([place]) 02/05/2015  . Thrombocytopenia ([place]) 02/02/2015  . High cholesterol    . Constipation, chronic 02/06/2014  . Diverticulosis 02/03/2014  . B12 deficiency 02/03/2014    Past Medical History:  Diagnosis Date  . Arthritis    "probably" (01/28/2012)  . Cancer ([place]) Around 2002-2003   Unclear history of "7 lb" abdominal tumor treated at [facility] ~2003 s/p open abdominal colonic resection and radiation  . Complication of anesthesia    "trouble waking up; too much med given" (01/28/2012)  . Constipation 02/03/2014  . Constipation, chronic 02/06/2014  . Diverticulitis    Per chart   . Diverticulosis 02/03/2014  . High cholesterol   . 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 Patient had surgery in 2000 and mass which was presumed to be a leiomyosarcoma rather than a spindle cell cancer. I have no further records of path reports to give me a good definite path diagnosis of her tumor resection.   . Precordial pain 02/12/2012  . SBO (small bowel obstruction) ([place]) 08/2009   Early, partial SBO improved with conservative Tx     Past Surgical History:  Procedure Laterality Date  . ABDOMINAL HYSTERECTOMY  ~ 1985   ovaries resected B; DUB.  Marland Kitchen APPENDECTOMY  1968  . CATARACT EXTRACTION, BILATERAL  09/27/2015  . [place]  . COLECTOMY  2003   "cancer"  . TONSILLECTOMY     "when I was a child"  . TOTAL HIP ARTHROPLASTY  Left 01/31/2015   Procedure:  ANTERIOR APPROACH  LEFT HEMI ARTHROPLASTY;  Surgeon: Rod Can, MD;  Location: [facility];  Service: Orthopedics;  Laterality: Left;  . TUMOR REMOVAL  1990's;  X 2   "benign"    Social History   Socioeconomic History  . Marital status: Married    Spouse name: Not on file  . Number of children: 3  . Years of education: Not on file  . Highest education level: Not on file  Occupational History  . Occupation: retired  Scientific laboratory technician  . Financial resource strain: Not on file  . Food insecurity:    Worry: Not on file    Inability: Not on file  . Transportation  needs:    Medical: Not on file    Non-medical: Not on file  Tobacco Use  . Smoking status: Never Smoker  . Smokeless tobacco: Never Used  Substance and Sexual Activity  . Alcohol use: Yes    Alcohol/week: 1.8 oz    Types: 3 Glasses of wine per week    Comment: social  . Drug use: No  . Sexual activity: Not Currently  Lifestyle  . Physical activity:    Days per week: Not on file    Minutes per session: Not on file  . Stress: Not on file  Relationships  . Social connections:    Talks on phone: Not on file    Gets together: Not on file    Attends religious service: Not on file    Active member of club or organization: Not on file    Attends meetings of clubs or organizations: Not on file    Relationship status: Not on file  . Intimate partner violence:    Fear of current or ex partner: Not on file    Emotionally abused: Not on file    Physically abused: Not on file    Forced sexual activity: Not on file  Other Topics Concern  . Not on file  Social History Narrative      Marital status: widowed in 08/2015; married x 60 yyears.      Children: 3 children in area; 4 grandchildren; no gg.      Lives: alone in [place].       Employment: Retired      Tobacco: smoked briefly in college      Alcohol: 1-2 glass of wine per week.      Exercise:  Very little in 2018; cleans house weekly; takes care garden in back and flowers.      ADLs: performs all ADLs; still drives.  No assistant devices.  Cleans townhome.  Does own grocery shopping.  Pay bills.        Advanced Directives:  FULL CODE; no prolonged measures.  HCPOA: son/David Medeiros          Family History  Problem Relation Age of Onset  . Heart failure Father 37       Heart failure  . Heart disease Father      Review of Systems  Constitutional: Negative.  Negative for chills and fever.  Respiratory: Negative.  Negative for shortness of breath.   Cardiovascular: Negative for chest pain and palpitations.    Gastrointestinal: Negative for abdominal pain, nausea and vomiting.  Neurological: Negative for dizziness and headaches.  Endo/Heme/Allergies: Does not bruise/bleed easily.  All other systems reviewed and are negative.   Vitals:   09/29/17 1132  BP: 120/60  Pulse: 67  Resp: 16  Temp: 97.8 F (  36.6 C)  SpO2: 97%    Physical Exam  Constitutional: She is oriented to person, place, and time. She appears well-developed and well-nourished.  HENT:  Head: Normocephalic and atraumatic.  Eyes: Pupils are equal, round, and reactive to light.  Neck: Normal range of motion.  Cardiovascular: Normal rate.  Pulmonary/Chest: Effort normal.  Musculoskeletal: Normal range of motion.  Neurological: She is alert and oriented to person, place, and time.  Skin: Skin is warm and dry. Capillary refill takes less than 2 seconds.  Positive puncture wound with visible foreign body to the left thenar area on left hand.  Psychiatric: She has a normal mood and affect. Her behavior is normal.  Vitals reviewed.   Procedure note: Area cleansed with Betadine and injected with 1 cc of plain lidocaine.  Foreign body removed with forceps.  No complications.  Sterile dressing applied.  ASSESSMENT & PLAN: Tandy was seen today for foreign object.  Diagnoses and all orders for this visit:  Puncture wound of left hand with foreign body, initial encounter -     Td vaccine greater than or equal to 7yo preservative free IM     Patient Instructions       IF you received an x-ray today, you will receive an invoice from [facility]. Please contact [facility] at [phone] with questions or concerns regarding your invoice.   IF you received labwork today, you will receive an invoice from [place]. Please contact [facility] at [phone] with questions or concerns regarding your invoice.   Our billing staff will not be able to assist you with questions regarding bills from these  companies.  You will be contacted with the lab results as soon as they are available. The fastest way to get your results is to activate your My Chart account. Instructions are located on the last page of this paperwork. If you have not heard from [place] regarding the results in 2 weeks, please contact this office.     Puncture Wound A puncture wound is an injury that is caused by a sharp, thin object that goes through your skin, such as a nail. A puncture wound usually does not leave a large opening in your skin, so it may not bleed a lot. However, when you get a puncture wound, dirt or other materials (foreign bodies) can be forced into your wound and break off inside. This makes it more likely that an infection will happen, such as tetanus. Follow these instructions at home: Medicines  Take or apply over-the-counter and prescription medicines only as told by your doctor.  If you were prescribed an antibiotic medicine, take or apply it as told by your doctor. Do not stop using the antibiotic even if your condition starts to get better. Wound care  There are many ways to close and cover a wound. For example, a wound can be covered with stitches (sutures), skin glue, or adhesive strips. Follow instructions from your doctor about: ? How to take care of your wound. ? When and how you should change your bandage (dressing). ? When you should remove your bandage. ? Removing whatever was used to close your wound.  Keep the bandage dry as told by your doctor. Do not take baths, swim, use a hot tub, or do anything that would put your wound underwater until your doctor says it is okay.  Clean the wound as told by your doctor.  Do not scratch or pick at the wound.  Check your wound every day for  signs of infection. Watch for: ? Redness, swelling, or pain. ? Fluid, blood, or pus. General instructions  Raise (elevate) the injured area above the level of your heart while you are sitting or lying  down.  If your puncture wound is in your foot, ask your doctor if you need to avoid putting weight on your foot and for how long.  Keep all follow-up visits as told by your doctor. This is important. Contact a doctor if:  You got a tetanus shot and you have any of these problems at the injection site: ? Swelling. ? Very bad pain. ? Redness. ? Bleeding.  You have a fever.  Your stitches come out.  You notice a bad smell coming from your wound or your bandage.  You notice something coming out of the wound, such as wood or glass.  Medicine does not help your pain.  You have more redness, swelling, or pain at the site of your wound.  You have fluid, blood, or pus coming from your wound.  You notice a change in the color of your skin near your wound.  You need to change the bandage often because fluid, blood, or pus is coming from the wound.  You start to have a new rash.  You start to have numbness around the wound. Get help right away if:  You have very bad swelling around the wound.  Your pain suddenly gets worse and is very bad.  You start to get painful skin lumps.  You have a red streak going away from your wound.  The wound is on your hand or foot and you cannot move a finger or toe like you usually can.  The wound is on your hand or foot and you notice that your fingers or toes look pale or bluish. This information is not intended to replace advice given to you by your health care provider. Make sure you discuss any questions you have with your health care provider. Document Released: 01/22/2008 Document Revised: 09/20/2015 Document Reviewed: 06/07/2014 Elsevier Interactive Patient Education  2018 Elsevier Inc.     Agustina Caroli, MD [facility]

## 2017-09-29 NOTE — Patient Instructions (Addendum)
IF you received an x-ray today, you will receive an invoice from [facility]. Please contact [facility] at [phone] with questions or concerns regarding your invoice.   IF you received labwork today, you will receive an invoice from [place]. Please contact [facility] at [phone] with questions or concerns regarding your invoice.   Our billing staff will not be able to assist you with questions regarding bills from these companies.  You will be contacted with the lab results as soon as they are available. The fastest way to get your results is to activate your My Chart account. Instructions are located on the last page of this paperwork. If you have not heard from [place] regarding the results in 2 weeks, please contact this office.     Puncture Wound A puncture wound is an injury that is caused by a sharp, thin object that goes through your skin, such as a nail. A puncture wound usually does not leave a large opening in your skin, so it may not bleed a lot. However, when you get a puncture wound, dirt or other materials (foreign bodies) can be forced into your wound and break off inside. This makes it more likely that an infection will happen, such as tetanus. Follow these instructions at home: Medicines  Take or apply over-the-counter and prescription medicines only as told by your doctor.  If you were prescribed an antibiotic medicine, take or apply it as told by your doctor. Do not stop using the antibiotic even if your condition starts to get better. Wound care  There are many ways to close and cover a wound. For example, a wound can be covered with stitches (sutures), skin glue, or adhesive strips. Follow instructions from your doctor about: ? How to take care of your wound. ? When and how you should change your bandage (dressing). ? When you should remove your bandage. ? Removing whatever was used to close your wound.  Keep the bandage dry as told by your  doctor. Do not take baths, swim, use a hot tub, or do anything that would put your wound underwater until your doctor says it is okay.  Clean the wound as told by your doctor.  Do not scratch or pick at the wound.  Check your wound every day for signs of infection. Watch for: ? Redness, swelling, or pain. ? Fluid, blood, or pus. General instructions  Raise (elevate) the injured area above the level of your heart while you are sitting or lying down.  If your puncture wound is in your foot, ask your doctor if you need to avoid putting weight on your foot and for how long.  Keep all follow-up visits as told by your doctor. This is important. Contact a doctor if:  You got a tetanus shot and you have any of these problems at the injection site: ? Swelling. ? Very bad pain. ? Redness. ? Bleeding.  You have a fever.  Your stitches come out.  You notice a bad smell coming from your wound or your bandage.  You notice something coming out of the wound, such as wood or glass.  Medicine does not help your pain.  You have more redness, swelling, or pain at the site of your wound.  You have fluid, blood, or pus coming from your wound.  You notice a change in the color of your skin near your wound.  You need to change the bandage often because fluid, blood, or pus is coming  from the wound.  You start to have a new rash.  You start to have numbness around the wound. Get help right away if:  You have very bad swelling around the wound.  Your pain suddenly gets worse and is very bad.  You start to get painful skin lumps.  You have a red streak going away from your wound.  The wound is on your hand or foot and you cannot move a finger or toe like you usually can.  The wound is on your hand or foot and you notice that your fingers or toes look pale or bluish. This information is not intended to replace advice given to you by your health care provider. Make sure you discuss any  questions you have with your health care provider. Document Released: 01/22/2008 Document Revised: 09/20/2015 Document Reviewed: 06/07/2014 Elsevier Interactive Patient Education  [employer].

## 2017-09-29 NOTE — Telephone Encounter (Signed)
Pt c/o thorn stuck in left hand which occurred Sunday. Pt stated that the area where the thorn punctured her skin looks red. The thorn is still in her hand and she stated that she needs help removing it. No record of last tetanus. Denies red streak or fever or pus- pt states it looks like a "pimple." Care advice given per protocol. Appt given for today. Pt requested Dr Mitchel Honour.  Reason for Disposition . [1] Looks infected (red area or pus) AND [2] no fever  Answer Assessment - Initial Assessment Questions 1. LOCATION: "Where is the puncture located?"      Left hand  2. OBJECT: "What was the object that punctured the skin?"      Thorn form a rose bush 3. DEPTH: "How deep do you think the puncture goes?"      To just under the skin 4. ONSET: "When did the injury occur?" (Minutes or hours)     Sunday 5. PAIN: "Is it painful?" If so, ask: "How bad is the pain?"  (Scale 1-10; or mild, moderate, severe)     no 6. TETANUS: "When was the last tetanus booster?" No tetanus documented 7. PREGNANCY: "Is there any chance you are pregnant?" "When was your last menstrual period?"     n/a  Protocols used: [place]

## 2017-10-14 ENCOUNTER — Encounter: Payer: Self-pay | Admitting: Emergency Medicine

## 2017-10-14 ENCOUNTER — Other Ambulatory Visit: Payer: Self-pay

## 2017-10-14 ENCOUNTER — Ambulatory Visit: Payer: Medicare Other | Admitting: Emergency Medicine

## 2017-10-14 VITALS — BP 130/68 | HR 70 | Temp 97.4°F | Resp 16 | Ht 68.5 in | Wt 110.8 lb

## 2017-10-14 DIAGNOSIS — E785 Hyperlipidemia, unspecified: Secondary | ICD-10-CM

## 2017-10-14 DIAGNOSIS — Z0000 Encounter for general adult medical examination without abnormal findings: Secondary | ICD-10-CM | POA: Diagnosis not present

## 2017-10-14 DIAGNOSIS — Z862 Personal history of diseases of the blood and blood-forming organs and certain disorders involving the immune mechanism: Secondary | ICD-10-CM

## 2017-10-14 DIAGNOSIS — D696 Thrombocytopenia, unspecified: Secondary | ICD-10-CM

## 2017-10-14 NOTE — Patient Instructions (Addendum)
   IF you received an x-ray today, you will receive an invoice from [facility]. Please contact [facility] at [phone] with questions or concerns regarding your invoice.   IF you received labwork today, you will receive an invoice from [facility]. Please contact [facility] at [phone] with questions or concerns regarding your invoice.   Our billing staff will not be able to assist you with questions regarding bills from these companies.  You will be contacted with the lab results as soon as they are available. The fastest way to get your results is to activate your My Chart account. Instructions are located on the last page of this paperwork. If you have not heard from us regarding the results in 2 weeks, please contact this office.     Health Maintenance, Female Adopting a healthy lifestyle and getting preventive care can go a long way to promote health and wellness. Talk with your health care provider about what schedule of regular examinations is right for you. This is a good chance for you to check in with your provider about disease prevention and staying healthy. In between checkups, there are plenty of things you can do on your own. Experts have done a lot of research about which lifestyle changes and preventive measures are most likely to keep you healthy. Ask your health care provider for more information. Weight and diet Eat a healthy diet  Be sure to include plenty of vegetables, fruits, low-fat dairy products, and lean protein.  Do not eat a lot of foods high in solid fats, added sugars, or salt.  Get regular exercise. This is one of the most important things you can do for your health. ? Most adults should exercise for at least 150 minutes each week. The exercise should increase your heart rate and make you sweat (moderate-intensity exercise). ? Most adults should also do strengthening exercises at least twice a week. This is in addition to the  moderate-intensity exercise.  Maintain a healthy weight  Body mass index (BMI) is a measurement that can be used to identify possible weight problems. It estimates body fat based on height and weight. Your health care provider can help determine your BMI and help you achieve or maintain a healthy weight.  For females 20 years of age and older: ? A BMI below 18.5 is considered underweight. ? A BMI of 18.5 to 24.9 is normal. ? A BMI of 25 to 29.9 is considered overweight. ? A BMI of 30 and above is considered obese.  Watch levels of cholesterol and blood lipids  You should start having your blood tested for lipids and cholesterol at 82 years of age, then have this test every 5 years.  You may need to have your cholesterol levels checked more often if: ? Your lipid or cholesterol levels are high. ? You are older than 82 years of age. ? You are at high risk for heart disease.  Cancer screening Lung Cancer  Lung cancer screening is recommended for adults 55-80 years old who are at high risk for lung cancer because of a history of smoking.  A yearly low-dose CT scan of the lungs is recommended for people who: ? Currently smoke. ? Have quit within the past 15 years. ? Have at least a 30-pack-year history of smoking. A pack year is smoking an average of one pack of cigarettes a day for 1 year.  Yearly screening should continue until it has been 15 years since you quit.  Yearly   screening should stop if you develop a health problem that would prevent you from having lung cancer treatment.  Breast Cancer  Practice breast self-awareness. This means understanding how your breasts normally appear and feel.  It also means doing regular breast self-exams. Let your health care provider know about any changes, no matter how small.  If you are in your 20s or 30s, you should have a clinical breast exam (CBE) by a health care provider every 1-3 years as part of a regular health exam.  If you  are 73 or older, have a CBE every year. Also consider having a breast X-ray (mammogram) every year.  If you have a family history of breast cancer, talk to your health care provider about genetic screening.  If you are at high risk for breast cancer, talk to your health care provider about having an MRI and a mammogram every year.  Breast cancer gene (BRCA) assessment is recommended for women who have family members with BRCA-related cancers. BRCA-related cancers include: ? Breast. ? Ovarian. ? Tubal. ? Peritoneal cancers.  Results of the assessment will determine the need for genetic counseling and BRCA1 and BRCA2 testing.  Cervical Cancer Your health care provider may recommend that you be screened regularly for cancer of the pelvic organs (ovaries, uterus, and vagina). This screening involves a pelvic examination, including checking for microscopic changes to the surface of your cervix (Pap test). You may be encouraged to have this screening done every 3 years, beginning at age 61.  For women ages 90-65, health care providers may recommend pelvic exams and Pap testing every 3 years, or they may recommend the Pap and pelvic exam, combined with testing for human papilloma virus (HPV), every 5 years. Some types of HPV increase your risk of cervical cancer. Testing for HPV may also be done on women of any age with unclear Pap test results.  Other health care providers may not recommend any screening for nonpregnant women who are considered low risk for pelvic cancer and who do not have symptoms. Ask your health care provider if a screening pelvic exam is right for you.  If you have had past treatment for cervical cancer or a condition that could lead to cancer, you need Pap tests and screening for cancer for at least 20 years after your treatment. If Pap tests have been discontinued, your risk factors (such as having a new sexual partner) need to be reassessed to determine if screening should  resume. Some women have medical problems that increase the chance of getting cervical cancer. In these cases, your health care provider may recommend more frequent screening and Pap tests.  Colorectal Cancer  This type of cancer can be detected and often prevented.  Routine colorectal cancer screening usually begins at 82 years of age and continues through 82 years of age.  Your health care provider may recommend screening at an earlier age if you have risk factors for colon cancer.  Your health care provider may also recommend using home test kits to check for hidden blood in the stool.  A small camera at the end of a tube can be used to examine your colon directly (sigmoidoscopy or colonoscopy). This is done to check for the earliest forms of colorectal cancer.  Routine screening usually begins at age 67.  Direct examination of the colon should be repeated every 5-10 years through 82 years of age. However, you may need to be screened more often if early forms of precancerous polyps  or small growths are found.  Skin Cancer  Check your skin from head to toe regularly.  Tell your health care provider about any new moles or changes in moles, especially if there is a change in a mole's shape or color.  Also tell your health care provider if you have a mole that is larger than the size of a pencil eraser.  Always use sunscreen. Apply sunscreen liberally and repeatedly throughout the day.  Protect yourself by wearing long sleeves, pants, a wide-brimmed hat, and sunglasses whenever you are outside.  Heart disease, diabetes, and high blood pressure  High blood pressure causes heart disease and increases the risk of stroke. High blood pressure is more likely to develop in: ? People who have blood pressure in the high end of the normal range (130-139/85-89 mm Hg). ? People who are overweight or obese. ? People who are African American.  If you are 18-39 years of age, have your blood  pressure checked every 3-5 years. If you are 40 years of age or older, have your blood pressure checked every year. You should have your blood pressure measured twice-once when you are at a hospital or clinic, and once when you are not at a hospital or clinic. Record the average of the two measurements. To check your blood pressure when you are not at a hospital or clinic, you can use: ? An automated blood pressure machine at a pharmacy. ? A home blood pressure monitor.  If you are between 55 years and 79 years old, ask your health care provider if you should take aspirin to prevent strokes.  Have regular diabetes screenings. This involves taking a blood sample to check your fasting blood sugar level. ? If you are at a normal weight and have a low risk for diabetes, have this test once every three years after 82 years of age. ? If you are overweight and have a high risk for diabetes, consider being tested at a younger age or more often. Preventing infection Hepatitis B  If you have a higher risk for hepatitis B, you should be screened for this virus. You are considered at high risk for hepatitis B if: ? You were born in a country where hepatitis B is common. Ask your health care provider which countries are considered high risk. ? Your parents were born in a high-risk country, and you have not been immunized against hepatitis B (hepatitis B vaccine). ? You have HIV or AIDS. ? You use needles to inject street drugs. ? You live with someone who has hepatitis B. ? You have had sex with someone who has hepatitis B. ? You get hemodialysis treatment. ? You take certain medicines for conditions, including cancer, organ transplantation, and autoimmune conditions.  Hepatitis C  Blood testing is recommended for: ? Everyone born from 1945 through 1965. ? Anyone with known risk factors for hepatitis C.  Sexually transmitted infections (STIs)  You should be screened for sexually transmitted  infections (STIs) including gonorrhea and chlamydia if: ? You are sexually active and are younger than 82 years of age. ? You are older than 82 years of age and your health care provider tells you that you are at risk for this type of infection. ? Your sexual activity has changed since you were last screened and you are at an increased risk for chlamydia or gonorrhea. Ask your health care provider if you are at risk.  If you do not have HIV, but are at risk,   it may be recommended that you take a prescription medicine daily to prevent HIV infection. This is called pre-exposure prophylaxis (PrEP). You are considered at risk if: ? You are sexually active and do not regularly use condoms or know the HIV status of your partner(s). ? You take drugs by injection. ? You are sexually active with a partner who has HIV.  Talk with your health care provider about whether you are at high risk of being infected with HIV. If you choose to begin PrEP, you should first be tested for HIV. You should then be tested every 3 months for as long as you are taking PrEP. Pregnancy  If you are premenopausal and you may become pregnant, ask your health care provider about preconception counseling.  If you may become pregnant, take 400 to 800 micrograms (mcg) of folic acid every day.  If you want to prevent pregnancy, talk to your health care provider about birth control (contraception). Osteoporosis and menopause  Osteoporosis is a disease in which the bones lose minerals and strength with aging. This can result in serious bone fractures. Your risk for osteoporosis can be identified using a bone density scan.  If you are 34 years of age or older, or if you are at risk for osteoporosis and fractures, ask your health care provider if you should be screened.  Ask your health care provider whether you should take a calcium or vitamin D supplement to lower your risk for osteoporosis.  Menopause may have certain physical  symptoms and risks.  Hormone replacement therapy may reduce some of these symptoms and risks. Talk to your health care provider about whether hormone replacement therapy is right for you. Follow these instructions at home:  Schedule regular health, dental, and eye exams.  Stay current with your immunizations.  Do not use any tobacco products including cigarettes, chewing tobacco, or electronic cigarettes.  If you are pregnant, do not drink alcohol.  If you are breastfeeding, limit how much and how often you drink alcohol.  Limit alcohol intake to no more than 1 drink per day for nonpregnant women. One drink equals 12 ounces of beer, 5 ounces of wine, or 1 ounces of hard liquor.  Do not use street drugs.  Do not share needles.  Ask your health care provider for help if you need support or information about quitting drugs.  Tell your health care provider if you often feel depressed.  Tell your health care provider if you have ever been abused or do not feel safe at home. This information is not intended to replace advice given to you by your health care provider. Make sure you discuss any questions you have with your health care provider. Document Released: 10/28/2010 Document Revised: 09/20/2015 Document Reviewed: 01/16/2015 Elsevier Interactive Patient Education  [employer].

## 2017-10-14 NOTE — Progress Notes (Addendum)
Monica Ward 84 y.o.   Chief Complaint  Patient presents with  . Annual Exam    HISTORY OF PRESENT ILLNESS: This is a 84 y.o. female Here for annual exam; no complaints and no medical concerns.   HPI   Prior to Admission medications   Medication Sig Start Date End Date Taking? Authorizing Provider  alendronate (FOSAMAX) 70 MG tablet Take 1 tablet (70 mg total) by mouth every 7 (seven) days. Take with a full glass of water on an empty stomach. Patient not taking: Reported on 04/14/2017 09/30/16   Wardell Honour, MD  Ascorbic Acid (VITAMIN C) 100 MG tablet Take 100 mg by mouth daily.    [provider]  aspirin EC 81 MG tablet Take 81 mg by mouth daily.    [provider]  Cyanocobalamin (VITAMIN B-12 PO) Take 1 tablet by mouth daily.    [provider]  Hyoscyamine Sulfate SL (LEVSIN/SL) 0.125 MG SUBL Place 1 tablet under the tongue every 6 (six) hours as needed. Patient not taking: Reported on 09/29/2017 04/14/17   Horald Pollen, MD  Multiple Vitamins-Minerals (MULTIVITAMIN WITH MINERALS) tablet Take 1 tablet by mouth daily.    [provider]  Multiple Vitamins-Minerals (ZINC PO) Take 1 tablet by mouth daily.    [provider]  Omega-3 Fatty Acids (OMEGA 3 PO) Take 1 capsule by mouth daily.    [provider]  VITAMIN E PO Take 1 tablet by mouth daily.    [provider]    No Known Allergies  Patient Active Problem List   Diagnosis Date Noted  . Puncture wound of left hand with foreign body 09/29/2017  . History of hip fracture 10/02/2016  . 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 History of small bowel obstruction 10/02/2016  . Age-related osteoporosis without current pathological fracture 10/02/2016  . Moderate malnutrition ([place]) 02/05/2015  . Thrombocytopenia ([place]) 02/02/2015  . High cholesterol   . Constipation, chronic 02/06/2014  . Diverticulosis 02/03/2014  . B12 deficiency 02/03/2014     Past Medical History:  Diagnosis Date  . Arthritis    "probably" (01/28/2012)  . Cancer ([place]) Around 2002-2003   Unclear history of "7 lb" abdominal tumor treated at [facility] ~2003 s/p open abdominal colonic resection and radiation  . Complication of anesthesia    "trouble waking up; too much med given" (01/28/2012)  . Constipation 02/03/2014  . Constipation, chronic 02/06/2014  . Diverticulitis    Per chart   . Diverticulosis 02/03/2014  . High cholesterol   . 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 Patient had surgery in 2000 and mass which was presumed to be a leiomyosarcoma rather than a spindle cell cancer. I have no further records of path reports to give me a good definite path diagnosis of her tumor resection.   . Precordial pain 02/12/2012  . SBO (small bowel obstruction) ([place]) 08/2009   Early, partial SBO improved with conservative Tx     Past Surgical History:  Procedure Laterality Date  . ABDOMINAL HYSTERECTOMY  ~ 1985   ovaries resected B; DUB.  Marland Kitchen APPENDECTOMY  1968  . CATARACT EXTRACTION, BILATERAL  09/27/2015  . [place]  . COLECTOMY  2003   "cancer"  . TONSILLECTOMY     "when I was a child"  . TOTAL HIP ARTHROPLASTY Left 01/31/2015   Procedure:  ANTERIOR APPROACH  LEFT HEMI ARTHROPLASTY;  Surgeon: Rod Can, MD;  Location: [facility];  Service: Orthopedics;  Laterality: Left;  . TUMOR REMOVAL  1990's;  X 2   "benign"    Social History   Socioeconomic History  . Marital status: Married    Spouse name: Not on file  . Number of children: 3  . Years of education: Not on file  . Highest education level: Not on file  Occupational History  . Occupation: retired  Scientific laboratory technician  . Financial resource strain: Not on file  . Food insecurity:    Worry: Not on file    Inability: Not on file  . Transportation needs:    Medical: Not on file    Non-medical: Not on file  Tobacco Use  . Smoking status: Never  Smoker  . Smokeless tobacco: Never Used  Substance and Sexual Activity  . Alcohol use: Yes    Alcohol/week: 1.8 oz    Types: 3 Glasses of wine per week    Comment: social  . Drug use: No  . Sexual activity: Not Currently  Lifestyle  . Physical activity:    Days per week: Not on file    Minutes per session: Not on file  . Stress: Not on file  Relationships  . Social connections:    Talks on phone: Not on file    Gets together: Not on file    Attends religious service: Not on file    Active member of club or organization: Not on file    Attends meetings of clubs or organizations: Not on file    Relationship status: Not on file  . Intimate partner violence:    Fear of current or ex partner: Not on file    Emotionally abused: Not on file    Physically abused: Not on file    Forced sexual activity: Not on file  Other Topics Concern  . Not on file  Social History Narrative      Marital status: widowed in 08/2015; married x 60 yyears.      Children: 3 children in area; 4 grandchildren; no gg.      Lives: alone in [place].       Employment: Retired      Tobacco: smoked briefly in college      Alcohol: 1-2 glass of wine per week.      Exercise:  Very little in 2018; cleans house weekly; takes care garden in back and flowers.      ADLs: performs all ADLs; still drives.  No assistant devices.  Cleans townhome.  Does own grocery shopping.  Pay bills.        Advanced Directives:  FULL CODE; no prolonged measures.  HCPOA: son/David Tarango          Family History  Problem Relation Age of Onset  . Heart failure Father 37       Heart failure  . Heart disease Father      Review of Systems  Constitutional: Negative.  Negative for chills, fever and weight loss.  HENT: Negative.  Negative for congestion, nosebleeds and sore throat.   Eyes: Negative.  Negative for blurred vision and double vision.  Respiratory: Negative.  Negative for cough and shortness of breath.    Cardiovascular: Negative.  Negative for chest pain and palpitations.  Gastrointestinal: Negative.  Negative for abdominal pain, diarrhea, nausea and vomiting.  Genitourinary: Negative.  Negative for dysuria and hematuria.  Skin: Negative.   Neurological: Negative.  Negative for dizziness and headaches.  Endo/Heme/Allergies: Negative.   All  other systems reviewed and are negative.   Vitals:   10/14/17 1117  BP: 130/68  Pulse: 70  Resp: 16  Temp: (!) 97.4 F (36.3 C)  SpO2: 98%    Physical Exam  Constitutional: She is oriented to person, place, and time. She appears well-developed and well-nourished.  HENT:  Head: Normocephalic and atraumatic.  Right Ear: External ear normal.  Left Ear: External ear normal.  Nose: Nose normal.  Mouth/Throat: Oropharynx is clear and moist.  Eyes: Pupils are equal, round, and reactive to light. Conjunctivae and EOM are normal.  Neck: Normal range of motion. Neck supple. No JVD present. No thyromegaly present.  Cardiovascular: Normal rate, regular rhythm, normal heart sounds and intact distal pulses.  Pulmonary/Chest: Effort normal and breath sounds normal.  Abdominal: Soft. Bowel sounds are normal. She exhibits no distension. There is no tenderness.  Musculoskeletal: Normal range of motion.  Lymphadenopathy:    She has no cervical adenopathy.  Neurological: She is alert and oriented to person, place, and time. No sensory deficit. She exhibits normal muscle tone.  Skin: Skin is warm and dry. Capillary refill takes less than 2 seconds.  Psychiatric: She has a normal mood and affect. Her behavior is normal.  Vitals reviewed.    ASSESSMENT & PLAN: Carly was seen today for annual exam.  Diagnoses and all orders for this visit:  Routine general medical examination at a health care facility -     CBC with Differential -     Comprehensive metabolic panel -     Hemoglobin A1c -     Lipid panel  Dyslipidemia  Thrombocytopenia  (HCC)  History of anemia    Patient Instructions       IF you received an x-ray today, you will receive an invoice from [facility]. Please contact [facility] at [phone] with questions or concerns regarding your invoice.   IF you received labwork today, you will receive an invoice from [facility]. Please contact [facility] at [phone] with questions or concerns regarding your invoice.   Our billing staff will not be able to assist you with questions regarding bills from these companies.  You will be contacted with the lab results as soon as they are available. The fastest way to get your results is to activate your My Chart account. Instructions are located on the last page of this paperwork. If you have not heard from [place] regarding the results in 2 weeks, please contact this office.     Health Maintenance, Female Adopting a healthy lifestyle and getting preventive care can go a long way to promote health and wellness. Talk with your health care provider about what schedule of regular examinations is right for you. This is a good chance for you to check in with your provider about disease prevention and staying healthy. In between checkups, there are plenty of things you can do on your own. Experts have done a lot of research about which lifestyle changes and preventive measures are most likely to keep you healthy. Ask your health care provider for more information. Weight and diet Eat a healthy diet  Be sure to include plenty of vegetables, fruits, low-fat dairy products, and lean protein.  Do not eat a lot of foods high in solid fats, added sugars, or salt.  Get regular exercise. This is one of the most important things you can do for your health. ? Most adults should exercise for at least 150 minutes each week. The exercise should increase your heart  rate and make you sweat (moderate-intensity exercise). ? Most adults should also do strengthening exercises  at least twice a week. This is in addition to the moderate-intensity exercise.  Maintain a healthy weight  Body mass index (BMI) is a measurement that can be used to identify possible weight problems. It estimates body fat based on height and weight. Your health care provider can help determine your BMI and help you achieve or maintain a healthy weight.  For females 6 years of age and older: ? A BMI below 18.5 is considered underweight. ? A BMI of 18.5 to 24.9 is normal. ? A BMI of 25 to 29.9 is considered overweight. ? A BMI of 30 and above is considered obese.  Watch levels of cholesterol and blood lipids  You should start having your blood tested for lipids and cholesterol at 82 years of age, then have this test every 5 years.  You may need to have your cholesterol levels checked more often if: ? Your lipid or cholesterol levels are high. ? You are older than 82 years of age. ? You are at high risk for heart disease.  Cancer screening Lung Cancer  Lung cancer screening is recommended for adults 73-77 years old who are at high risk for lung cancer because of a history of smoking.  A yearly low-dose CT scan of the lungs is recommended for people who: ? Currently smoke. ? Have quit within the past 15 years. ? Have at least a 30-pack-year history of smoking. A pack year is smoking an average of one pack of cigarettes a day for 1 year.  Yearly screening should continue until it has been 15 years since you quit.  Yearly screening should stop if you develop a health problem that would prevent you from having lung cancer treatment.  Breast Cancer  Practice breast self-awareness. This means understanding how your breasts normally appear and feel.  It also means doing regular breast self-exams. Let your health care provider know about any changes, no matter how small.  If you are in your 20s or 30s, you should have a clinical breast exam (CBE) by a health care provider every 1-3  years as part of a regular health exam.  If you are 24 or older, have a CBE every year. Also consider having a breast X-ray (mammogram) every year.  If you have a family history of breast cancer, talk to your health care provider about genetic screening.  If you are at high risk for breast cancer, talk to your health care provider about having an MRI and a mammogram every year.  Breast cancer gene (BRCA) assessment is recommended for women who have family members with BRCA-related cancers. BRCA-related cancers include: ? Breast. ? Ovarian. ? Tubal. ? Peritoneal cancers.  Results of the assessment will determine the need for genetic counseling and BRCA1 and BRCA2 testing.  Cervical Cancer Your health care provider may recommend that you be screened regularly for cancer of the pelvic organs (ovaries, uterus, and vagina). This screening involves a pelvic examination, including checking for microscopic changes to the surface of your cervix (Pap test). You may be encouraged to have this screening done every 3 years, beginning at age 32.  For women ages 21-65, health care providers may recommend pelvic exams and Pap testing every 3 years, or they may recommend the Pap and pelvic exam, combined with testing for human papilloma virus (HPV), every 5 years. Some types of HPV increase your risk of cervical cancer. Testing  for HPV may also be done on women of any age with unclear Pap test results.  Other health care providers may not recommend any screening for nonpregnant women who are considered low risk for pelvic cancer and who do not have symptoms. Ask your health care provider if a screening pelvic exam is right for you.  If you have had past treatment for cervical cancer or a condition that could lead to cancer, you need Pap tests and screening for cancer for at least 20 years after your treatment. If Pap tests have been discontinued, your risk factors (such as having a new sexual partner) need to  be reassessed to determine if screening should resume. Some women have medical problems that increase the chance of getting cervical cancer. In these cases, your health care provider may recommend more frequent screening and Pap tests.  Colorectal Cancer  This type of cancer can be detected and often prevented.  Routine colorectal cancer screening usually begins at 82 years of age and continues through 82 years of age.  Your health care provider may recommend screening at an earlier age if you have risk factors for colon cancer.  Your health care provider may also recommend using home test kits to check for hidden blood in the stool.  A small camera at the end of a tube can be used to examine your colon directly (sigmoidoscopy or colonoscopy). This is done to check for the earliest forms of colorectal cancer.  Routine screening usually begins at age 34.  Direct examination of the colon should be repeated every 5-10 years through 82 years of age. However, you may need to be screened more often if early forms of precancerous polyps or small growths are found.  Skin Cancer  Check your skin from head to toe regularly.  Tell your health care provider about any new moles or changes in moles, especially if there is a change in a mole's shape or color.  Also tell your health care provider if you have a mole that is larger than the size of a pencil eraser.  Always use sunscreen. Apply sunscreen liberally and repeatedly throughout the day.  Protect yourself by wearing long sleeves, pants, a wide-brimmed hat, and sunglasses whenever you are outside.  Heart disease, diabetes, and high blood pressure  High blood pressure causes heart disease and increases the risk of stroke. High blood pressure is more likely to develop in: ? People who have blood pressure in the high end of the normal range (130-139/85-89 mm Hg). ? People who are overweight or obese. ? People who are African American.  If  you are 55-61 years of age, have your blood pressure checked every 3-5 years. If you are 39 years of age or older, have your blood pressure checked every year. You should have your blood pressure measured twice-once when you are at a hospital or clinic, and once when you are not at a hospital or clinic. Record the average of the two measurements. To check your blood pressure when you are not at a hospital or clinic, you can use: ? An automated blood pressure machine at a pharmacy. ? A home blood pressure monitor.  If you are between 81 years and 82 years old, ask your health care provider if you should take aspirin to prevent strokes.  Have regular diabetes screenings. This involves taking a blood sample to check your fasting blood sugar level. ? If you are at a normal weight and have a low risk  for diabetes, have this test once every three years after 82 years of age. ? If you are overweight and have a high risk for diabetes, consider being tested at a younger age or more often. Preventing infection Hepatitis B  If you have a higher risk for hepatitis B, you should be screened for this virus. You are considered at high risk for hepatitis B if: ? You were born in a country where hepatitis B is common. Ask your health care provider which countries are considered high risk. ? Your parents were born in a high-risk country, and you have not been immunized against hepatitis B (hepatitis B vaccine). ? You have HIV or AIDS. ? You use needles to inject street drugs. ? You live with someone who has hepatitis B. ? You have had sex with someone who has hepatitis B. ? You get hemodialysis treatment. ? You take certain medicines for conditions, including cancer, organ transplantation, and autoimmune conditions.  Hepatitis C  Blood testing is recommended for: ? Everyone born from [birth year] through 1963. ? Anyone with known risk factors for hepatitis C.  Sexually transmitted infections (STIs)  You should  be screened for sexually transmitted infections (STIs) including gonorrhea and chlamydia if: ? You are sexually active and are younger than 82 years of age. ? You are older than 82 years of age and your health care provider tells you that you are at risk for this type of infection. ? Your sexual activity has changed since you were last screened and you are at an increased risk for chlamydia or gonorrhea. Ask your health care provider if you are at risk.  If you do not have HIV, but are at risk, it may be recommended that you take a prescription medicine daily to prevent HIV infection. This is called pre-exposure prophylaxis (PrEP). You are considered at risk if: ? You are sexually active and do not regularly use condoms or know the HIV status of your partner(s). ? You take drugs by injection. ? You are sexually active with a partner who has HIV.  Talk with your health care provider about whether you are at high risk of being infected with HIV. If you choose to begin PrEP, you should first be tested for HIV. You should then be tested every 3 months for as long as you are taking PrEP. Pregnancy  If you are premenopausal and you may become pregnant, ask your health care provider about preconception counseling.  If you may become pregnant, take 400 to 800 micrograms (mcg) of folic acid every day.  If you want to prevent pregnancy, talk to your health care provider about birth control (contraception). Osteoporosis and menopause  Osteoporosis is a disease in which the bones lose minerals and strength with aging. This can result in serious bone fractures. Your risk for osteoporosis can be identified using a bone density scan.  If you are 71 years of age or older, or if you are at risk for osteoporosis and fractures, ask your health care provider if you should be screened.  Ask your health care provider whether you should take a calcium or vitamin D supplement to lower your risk for  osteoporosis.  Menopause may have certain physical symptoms and risks.  Hormone replacement therapy may reduce some of these symptoms and risks. Talk to your health care provider about whether hormone replacement therapy is right for you. Follow these instructions at home:  Schedule regular health, dental, and eye exams.  Stay current with your  immunizations.  Do not use any tobacco products including cigarettes, chewing tobacco, or electronic cigarettes.  If you are pregnant, do not drink alcohol.  If you are breastfeeding, limit how much and how often you drink alcohol.  Limit alcohol intake to no more than 1 drink per day for nonpregnant women. One drink equals 12 ounces of beer, 5 ounces of wine, or 1 ounces of hard liquor.  Do not use street drugs.  Do not share needles.  Ask your health care provider for help if you need support or information about quitting drugs.  Tell your health care provider if you often feel depressed.  Tell your health care provider if you have ever been abused or do not feel safe at home. This information is not intended to replace advice given to you by your health care provider. Make sure you discuss any questions you have with your health care provider. Document Released: 10/28/2010 Document Revised: 09/20/2015 Document Reviewed: 01/16/2015 Elsevier Interactive Patient Education  2018 [employer]vier Inc.      Agustina Caroli, MD [facility]

## 2017-10-15 LAB — COMPREHENSIVE METABOLIC PANEL
ALT: 13 IU/L (ref 0–32)
AST: 19 IU/L (ref 0–40)
Albumin/Globulin Ratio: 1.9 (ref 1.2–2.2)
Albumin: 4.6 g/dL (ref 3.5–4.7)
Alkaline Phosphatase: 59 IU/L (ref 39–117)
BUN/Creatinine Ratio: 26 (ref 12–28)
BUN: 18 mg/dL (ref 8–27)
Bilirubin Total: 0.5 mg/dL (ref 0.0–1.2)
CO2: 25 mmol/L (ref 20–29)
Calcium: 9.4 mg/dL (ref 8.7–10.3)
Chloride: 106 mmol/L (ref 96–106)
Creatinine, Ser: 0.7 mg/dL (ref 0.57–1.00)
GFR calc Af Amer: 91 mL/min/{1.73_m2} (ref >59)
GFR calc non Af Amer: 79 mL/min/{1.73_m2} (ref >59)
Globulin, Total: 2.4 g/dL (ref 1.5–4.5)
Glucose: 93 mg/dL (ref 65–99)
Potassium: 4.6 mmol/L (ref 3.5–5.2)
Sodium: 144 mmol/L (ref 134–144)
Total Protein: 7 g/dL (ref 6.0–8.5)

## 2017-10-15 LAB — CBC WITH DIFFERENTIAL/PLATELET
Basophils Absolute: 0 10*3/uL (ref 0.0–0.2)
Basos: 0 %
EOS (ABSOLUTE): 0.1 10*3/uL (ref 0.0–0.4)
Eos: 1 %
Hematocrit: 42.9 % (ref 34.0–46.6)
Hemoglobin: 14.6 g/dL (ref 11.1–15.9)
Immature Grans (Abs): 0 10*3/uL (ref 0.0–0.1)
Immature Granulocytes: 0 %
Lymphocytes Absolute: 0.9 10*3/uL (ref 0.7–3.1)
Lymphs: 20 %
MCH: 32.4 pg (ref 26.6–33.0)
MCHC: 34 g/dL (ref 31.5–35.7)
MCV: 95 fL (ref 79–97)
Monocytes Absolute: 0.4 10*3/uL (ref 0.1–0.9)
Monocytes: 9 %
Neutrophils Absolute: 3.3 10*3/uL (ref 1.4–7.0)
Neutrophils: 70 %
Platelets: 270 10*3/uL (ref 150–450)
RBC: 4.51 x10E6/uL (ref 3.77–5.28)
RDW: 13.6 % (ref 12.3–15.4)
WBC: 4.7 10*3/uL (ref 3.4–10.8)

## 2017-10-15 LAB — LIPID PANEL
Chol/HDL Ratio: 3.5 ratio (ref 0.0–4.4)
Cholesterol, Total: 253 mg/dL — ABNORMAL HIGH (ref 100–199)
HDL: 72 mg/dL (ref >39)
LDL Calculated: 166 mg/dL — ABNORMAL HIGH (ref 0–99)
Triglycerides: 77 mg/dL (ref 0–149)
VLDL Cholesterol Cal: 15 mg/dL (ref 5–40)

## 2017-10-15 LAB — HEMOGLOBIN A1C
Est. average glucose Bld gHb Est-mCnc: 105 mg/dL
Hgb A1c MFr Bld: 5.3 % (ref 4.8–5.6)

## 2017-10-17 ENCOUNTER — Encounter: Payer: Self-pay | Admitting: Radiology

## 2018-01-13 ENCOUNTER — Encounter: Payer: Self-pay | Admitting: Family Medicine

## 2018-01-18 ENCOUNTER — Encounter: Payer: Self-pay | Admitting: *Deleted

## 2018-05-25 ENCOUNTER — Emergency Department (HOSPITAL_COMMUNITY): Payer: Medicare Other

## 2018-05-25 ENCOUNTER — Other Ambulatory Visit: Payer: Self-pay

## 2018-05-25 ENCOUNTER — Encounter (HOSPITAL_COMMUNITY): Payer: Self-pay | Admitting: Emergency Medicine

## 2018-05-25 ENCOUNTER — Emergency Department (HOSPITAL_COMMUNITY)
Admission: EM | Admit: 2018-05-25 | Discharge: 2018-05-25 | Disposition: A | Discharge status: Home / Self Care | Payer: Medicare Other | Attending: Emergency Medicine | Admitting: Emergency Medicine

## 2018-05-25 DIAGNOSIS — Z7982 Long term (current) use of aspirin: Secondary | ICD-10-CM | POA: Insufficient documentation

## 2018-05-25 DIAGNOSIS — Z96642 Presence of left artificial hip joint: Secondary | ICD-10-CM | POA: Diagnosis not present

## 2018-05-25 DIAGNOSIS — R1030 Lower abdominal pain, unspecified: Secondary | ICD-10-CM | POA: Diagnosis present

## 2018-05-25 LAB — URINALYSIS, ROUTINE W REFLEX MICROSCOPIC
Bilirubin Urine: NEGATIVE
Glucose, UA: NEGATIVE mg/dL
Ketones, ur: 20 mg/dL — AB
Nitrite: NEGATIVE
Protein, ur: NEGATIVE mg/dL
Specific Gravity, Urine: 1.01 (ref 1.005–1.030)
pH: 6 (ref 5.0–8.0)

## 2018-05-25 LAB — CBC
HCT: 42.9 % (ref 36.0–46.0)
Hemoglobin: 13.6 g/dL (ref 12.0–15.0)
MCH: 31.2 pg (ref 26.0–34.0)
MCHC: 31.7 g/dL (ref 30.0–36.0)
MCV: 98.4 fL (ref 80.0–100.0)
Platelets: 300 10*3/uL (ref 150–400)
RBC: 4.36 MIL/uL (ref 3.87–5.11)
RDW: 13.6 % (ref 11.5–15.5)
WBC: 9.5 10*3/uL (ref 4.0–10.5)
nRBC: 0 % (ref 0.0–0.2)

## 2018-05-25 LAB — COMPREHENSIVE METABOLIC PANEL
ALT: 14 U/L (ref 0–44)
AST: 21 U/L (ref 15–41)
Albumin: 4.3 g/dL (ref 3.5–5.0)
Alkaline Phosphatase: 54 U/L (ref 38–126)
Anion gap: 9 (ref 5–15)
BUN: 20 mg/dL (ref 8–23)
CO2: 26 mmol/L (ref 22–32)
Calcium: 9.1 mg/dL (ref 8.9–10.3)
Chloride: 101 mmol/L (ref 98–111)
Creatinine, Ser: 0.67 mg/dL (ref 0.44–1.00)
GFR calc Af Amer: >60 mL/min (ref >60)
GFR calc non Af Amer: >60 mL/min (ref >60)
Glucose, Bld: 103 mg/dL — ABNORMAL HIGH (ref 70–99)
Potassium: 4 mmol/L (ref 3.5–5.1)
Sodium: 136 mmol/L (ref 135–145)
Total Bilirubin: 1.2 mg/dL (ref 0.3–1.2)
Total Protein: 7.2 g/dL (ref 6.5–8.1)

## 2018-05-25 LAB — LIPASE, BLOOD: Lipase: 28 U/L (ref 11–51)

## 2018-05-25 MED ORDER — IOPAMIDOL (ISOVUE-300) INJECTION 61%
INTRAVENOUS | Status: AC
  Filled 2018-05-25: qty 100

## 2018-05-25 MED ORDER — SODIUM CHLORIDE 0.9 % IV BOLUS
500.0000 mL | Freq: Once | INTRAVENOUS | Status: AC
  Administered 2018-05-25: 500 mL via INTRAVENOUS

## 2018-05-25 MED ORDER — SODIUM CHLORIDE (PF) 0.9 % IJ SOLN
INTRAMUSCULAR | Status: AC
  Filled 2018-05-25: qty 50

## 2018-05-25 MED ORDER — SODIUM CHLORIDE 0.9% FLUSH
3.0000 mL | Freq: Once | INTRAVENOUS | Status: AC
  Administered 2018-05-25: 3 mL via INTRAVENOUS

## 2018-05-25 MED ORDER — ONDANSETRON 4 MG PO TBDP: 4.0000 mg | ORAL_TABLET | Freq: Three times a day (TID) | ORAL | 0 refills | Status: DC | PRN

## 2018-05-25 MED ORDER — IOPAMIDOL (ISOVUE-300) INJECTION 61%
100.0000 mL | Freq: Once | INTRAVENOUS | Status: AC | PRN
  Administered 2018-05-25: 100 mL via INTRAVENOUS

## 2018-05-25 MED ORDER — IOHEXOL 300 MG/ML  SOLN
30.0000 mL | Freq: Once | INTRAMUSCULAR | Status: AC | PRN
  Administered 2018-05-25: 30 mL via ORAL

## 2018-05-25 MED ORDER — ONDANSETRON 4 MG PO TBDP
4.0000 mg | ORAL_TABLET | Freq: Once | ORAL | Status: AC
  Administered 2018-05-25: 4 mg via ORAL
  Filled 2018-05-25: qty 1

## 2018-05-25 NOTE — ED Notes (Signed)
Patient transported to CT 

## 2018-05-25 NOTE — ED Notes (Signed)
Pt made aware of need for urine specimen 

## 2018-05-25 NOTE — Discharge Instructions (Addendum)
You were seen in the emergency department today for abdominal pain.  Your work-up in the emergency department was overall reassuring.  Your urine had some bacteria in it, we are culturing this to determine if you need treatment for a UTI, but will hold off at this point.  Your CT scan showed some stool in the colon and some inflammation throughout the intestines.  At this time we suspect your symptoms are related to constipation and possibly a component of a viral GI illness.  [place]ola Zofran to take as needed every 8 hours for nausea and vomiting.  Please take Tylenol per over-the-counter dosing for discomfort.  Please follow-up with your primary care provider within 3 days.  Return to the ER for new or worsening symptoms including but not limited to fever, worsening pain, inability to keep fluids down, blood in your stool, or any other concerns.

## 2018-05-25 NOTE — ED Provider Notes (Signed)
[facility] DEPT Provider Note   CSN: [id number] Arrival date & time: 05/25/18  1034     History   Chief Complaint Chief Complaint  Patient presents with  . Abdominal Pain    HPI Monica Ward is a 85 y.o. female with a history of chronic constipation, diverticulitis, prior small bowel obstructions, as well as prior abdominal surgeries including colectomy, appendectomy, and hysterectomy who presents to the emergency department with complaints of abdominal pain that started yesterday evening after eating.  The pain is located in the lower abdomen bilaterally, it is constant, initially was a 10 out of 10 in severity but has seemed to ease off to a 5 out of 10 at present.  She states that her last bowel movement was last evening after the pain had started and was typical for her, she usually has problems with constipation and this is unchanged.  Not had a bowel movement today.  She had associated nausea without vomiting.  Denies specific alleviating or aggravating factors.  She did take some aspirin earlier which did not seem to help much.  Denies fever, chills, melena, hematochezia, or dysuria.  He states that she may have had pain like this before and it was treated with a tablet that dissolves underneath the tongue, she thinks any with this was Zofran, she is requesting this in the emergency department today.  HPI  Past Medical History:  Diagnosis Date  . Arthritis    "probably" (01/28/2012)  . Cancer ([place]) Around 2002-2003   Unclear history of "7 lb" abdominal tumor treated at [facility] ~2003 s/p open abdominal colonic resection and radiation  . Complication of anesthesia    "trouble waking up; too much med given" (01/28/2012)  . Constipation 02/03/2014  . Constipation, chronic 02/06/2014  . Diverticulitis    Per chart   . Diverticulosis 02/03/2014  . High cholesterol   . 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 Patient had surgery in 2000 and mass which was presumed to be a leiomyosarcoma rather than a spindle cell cancer. I have no further records of path reports to give me a good definite path diagnosis of her tumor resection.   . Precordial pain 02/12/2012  . SBO (small bowel obstruction) ([place]) 08/2009   Early, partial SBO improved with conservative Tx     Patient Active Problem List   Diagnosis Date Noted  . Puncture wound of left hand with foreign body 09/29/2017  . History of hip fracture 10/02/2016  . 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 History of small bowel obstruction 10/02/2016  . Age-related osteoporosis without current pathological fracture 10/02/2016  . Moderate malnutrition ([place]) 02/05/2015  . Thrombocytopenia ([place]) 02/02/2015  . High cholesterol   . Constipation, chronic 02/06/2014  . Diverticulosis 02/03/2014  . B12 deficiency 02/03/2014    Past Surgical History:  Procedure Laterality Date  . ABDOMINAL HYSTERECTOMY  ~ 1985   ovaries resected B; DUB.  Marland Kitchen APPENDECTOMY  1968  . CATARACT EXTRACTION, BILATERAL  09/27/2015  . [place]  . COLECTOMY  2003   "cancer"  . TONSILLECTOMY     "when I was a child"  . TOTAL HIP ARTHROPLASTY Left 01/31/2015   Procedure:  ANTERIOR APPROACH  LEFT HEMI ARTHROPLASTY;  Surgeon: Rod Can, MD;  Location: [facility] ORS;  Service: Orthopedics;  Laterality: Left;  . TUMOR REMOVAL  1990's;  X 2   "benign"     OB History  No obstetric history on file.      Home Medications    Prior to Admission medications   Medication Sig Start Date End Date Taking? Authorizing Provider  Ascorbic Acid (VITAMIN C) 100 MG tablet Take 100 mg by mouth daily as needed (cold ppx).    Yes [provider]  aspirin EC 81 MG tablet Take 81 mg by mouth daily.   Yes [provider]  alendronate (FOSAMAX) 70 MG tablet Take 1 tablet (70 mg total) by mouth every 7 (seven) days. Take with a full glass of water on an empty  stomach. Patient not taking: Reported on 04/14/2017 09/30/16   Wardell Honour, MD  Hyoscyamine Sulfate SL (LEVSIN/SL) 0.125 MG SUBL Place 1 tablet under the tongue every 6 (six) hours as needed. Patient not taking: Reported on 09/29/2017 04/14/17   Horald Pollen, MD    Family History Family History  Problem Relation Age of Onset  . Heart failure Father 40       Heart failure  . Heart disease Father     Social History Social History   Tobacco Use  . Smoking status: Never Smoker  . Smokeless tobacco: Never Used  Substance Use Topics  . Alcohol use: Yes    Alcohol/week: 3.0 standard drinks    Types: 3 Glasses of wine per week    Comment: social  . Drug use: No     Allergies   Patient has no known allergies.   Review of Systems Review of Systems  Constitutional: Negative for chills and fever.  Respiratory: Negative for shortness of breath.   Cardiovascular: Negative for chest pain.  Gastrointestinal: Positive for abdominal pain, constipation (chronic unchanged) and nausea. Negative for anal bleeding, blood in stool, diarrhea and vomiting.  Genitourinary: Negative for dysuria.  All other systems reviewed and are negative.    Physical Exam Updated Vital Signs BP (!) 155/72 (BP Location: Right Arm)   Pulse 76   Temp 97.7 F (36.5 C) (Oral)   Resp 16   Ht 5\' 8"  (1.727 m)   Wt 49.9 kg   SpO2 97%   BMI 16.73 kg/m   Physical Exam Vitals signs and nursing note reviewed.  Constitutional:      General: She is not in acute distress.    Appearance: She is well-developed. She is not toxic-appearing.  HENT:     Head: Normocephalic and atraumatic.  Eyes:     General:        Right eye: No discharge.        Left eye: No discharge.     Conjunctiva/sclera: Conjunctivae normal.  Neck:     Musculoskeletal: Neck supple.  Cardiovascular:     Rate and Rhythm: Normal rate and regular rhythm.  Pulmonary:     Effort: Pulmonary effort is normal. No respiratory distress.      Breath sounds: Normal breath sounds. No wheezing, rhonchi or rales.  Abdominal:     General: There is no distension.     Palpations: Abdomen is soft.     Tenderness: There is abdominal tenderness (most prominent in RLQ) in the right lower quadrant, suprapubic area and left lower quadrant. There is no guarding or rebound.  Skin:    General: Skin is warm and dry.     Findings: No rash.  Neurological:     Mental Status: She is alert.     Comments: Clear speech.   Psychiatric:        Behavior: Behavior normal.  ED Treatments / Results  Labs (all labs ordered are listed, but only abnormal results are displayed) Labs Reviewed  COMPREHENSIVE METABOLIC PANEL - Abnormal; Notable for the following components:      Result Value   Glucose, Bld 103 (*)    All other components within normal limits  URINALYSIS, ROUTINE W REFLEX MICROSCOPIC - Abnormal; Notable for the following components:   Hgb urine dipstick SMALL (*)    Ketones, ur 20 (*)    Leukocytes, UA TRACE (*)    Bacteria, UA MANY (*)    All other components within normal limits  URINE CULTURE  LIPASE, BLOOD  CBC    EKG None  Radiology Ct Abdomen Pelvis W Contrast  Result Date: 05/25/2018 CLINICAL DATA:  Generalized abdominal pain and nausea last bowel movement yesterday. History of colon cancer, leiomyosarcoma, small-bowel obstruction, appendectomy, hysterectomy. EXAM: CT ABDOMEN AND PELVIS WITH CONTRAST TECHNIQUE: Multidetector CT imaging of the abdomen and pelvis was performed using the standard protocol following bolus administration of intravenous contrast. CONTRAST:  20mL OMNIPAQUE IOHEXOL 300 MG/ML SOLN, 152mL ISOVUE-300 IOPAMIDOL (ISOVUE-300) INJECTION 61% COMPARISON:  CT abdomen and pelvis February 03, 2014 FINDINGS: LOWER CHEST: Lung bases are clear. Included heart size is normal. No pericardial effusion. HEPATOBILIARY: Included liver is normal, dome of liver out of field of view. Normal gallbladder. PANCREAS: Normal.  SPLEEN: Normal. ADRENALS/URINARY TRACT: Kidneys are orthotopic, demonstrating symmetric enhancement. No nephrolithiasis, hydronephrosis or solid renal masses. 2.7 cm homogeneously hypodense benign-appearing LEFT interpolar cyst. Too small to characterize hypodensity RIGHT interpolar kidney. The unopacified ureters are normal in course and caliber. Delayed imaging through the kidneys demonstrates symmetric prompt contrast excretion within the proximal urinary collecting system. Urinary bladder is partially distended and unremarkable. Normal adrenal glands. STOMACH/BOWEL: The stomach, small and large bowel are normal in course and caliber without inflammatory changes. Enteric contrast has not yet reached the distal small bowel. Moderate to large volume retained large bowel stool. Sigmoid bowel anastomosis in mild transition point. Multiple loops of mildly thickened small bowel and LEFT lower quadrant associated with short segment small bowel intussusception. Contrast filled patulous bowel in central pelvis, possibly reflecting the cecum. VASCULAR/LYMPHATIC: Aortoiliac vessels are normal in course and caliber. Mild calcific atherosclerosis. No lymphadenopathy by CT size criteria. REPRODUCTIVE: Status post hysterectomy. OTHER: No intraperitoneal free fluid or free air. MUSCULOSKELETAL: Streak artifact from LEFT hip total arthroplasty. Anterior abdominal wall scarring. Grade 1 L4-5 anterolisthesis without spondylolysis. Moderate to severe canal stenosis L4-5. Severe L5-S1 degenerative disc and minimal retrolisthesis. Osteopenia. IMPRESSION: 1. Moderate to large volume retained large bowel stool, transition point at sigmoid colon without convincing bowel obstruction. 2. Short segment small bowel intussusception may be transient, associated mild small-bowel enteritis. Aortic Atherosclerosis (ICD10-I70.0). Electronically Signed   By: Elon Alas M.D.   On: 05/25/2018 19:12    Procedures Procedures (including  critical care time)  Medications Ordered in ED Medications  sodium chloride flush (NS) 0.9 % injection 3 mL (has no administration in time range)     Initial Impression / Assessment and Plan / ED Course  I have reviewed the triage vital signs and the nursing notes.  Pertinent labs & imaging results that were available during my care of the patient were reviewed by me and considered in my medical decision making (see chart for details).   Patient presents to the ED with complaints of abdominal pain. Nontoxic appearing, in no apparent distress, vitals WNL with the exception of elevated BP- doubt HTN emergency. Exam with lower abdominal  tenderness, most prominent in RLQ, no peritoneal signs on initial assessment. Zofran ordered as this is what patient is requesting. Will plan for CT abdomen/pelvis given age, hx, and exam.   CBC: Unremarkable. No leukocytosis or anemia.  CMP: Unremarkable. LFTs & renal function WNL. No electrolyte disturbance.  Lipase: WNL UA: Many bacteria, trace leuks- nitrite negative, no urinary sxs- will culture urine CT abdomen/pelvis: Moderate to large stool noted, no obvious obstruction or perforation. Mild small bowel enteritis.   Reevaluation patient is feeling much better after Zofran, she has been able to tolerate p.o., she is requesting discharge.  Repeat abdominal exam without peritoneal signs, doubt obstruction/perforation, diverticulitis, cholecystitis, or pancreatitis.  Possibly viral GI illness w/ enteritis superimposed on her chronic constipation.  Will discharge home with Zofran and primary care follow-up. I discussed results, treatment plan, need for follow-up, and return precautions with the patient & her daughter at bedside. Provided opportunity for questions, patient & her daughter confirmed understanding and are in agreement with plan.   Findings and plan of care discussed with supervising physician Dr. Maryan Rued who personally evaluated and examined this  patient and is in agreement.    Final Clinical Impressions(s) / ED Diagnoses   Final diagnoses:  Lower abdominal pain    ED Discharge Orders         Ordered    ondansetron (ZOFRAN ODT) 4 MG disintegrating tablet  Every 8 hours PRN     05/25/18 [address] 05/25/18 2014    Blanchie Dessert, MD 05/26/18 1715

## 2018-05-25 NOTE — ED Triage Notes (Addendum)
Patient c/o generalized abdominal pain with nausea since last. Hx abdominal surgeries. Denies V/D. Last BM yesterday.

## 2018-05-28 LAB — URINE CULTURE: Culture: >=100000 — AB

## 2019-02-03 ENCOUNTER — Ambulatory Visit: Payer: Medicare Other | Admitting: Emergency Medicine

## 2019-02-23 ENCOUNTER — Encounter: Payer: Medicare Other | Admitting: Emergency Medicine

## 2019-03-02 ENCOUNTER — Ambulatory Visit (INDEPENDENT_AMBULATORY_CARE_PROVIDER_SITE_OTHER): Payer: Medicare Other | Admitting: Emergency Medicine

## 2019-03-02 ENCOUNTER — Encounter: Payer: Self-pay | Admitting: Emergency Medicine

## 2019-03-02 ENCOUNTER — Encounter: Payer: Medicare Other | Admitting: Emergency Medicine

## 2019-03-02 ENCOUNTER — Other Ambulatory Visit: Payer: Self-pay

## 2019-03-02 VITALS — BP 145/60 | HR 62 | Temp 97.5°F | Resp 16 | Ht 68.0 in | Wt 110.6 lb

## 2019-03-02 DIAGNOSIS — Z1329 Encounter for screening for other suspected endocrine disorder: Secondary | ICD-10-CM | POA: Diagnosis not present

## 2019-03-02 DIAGNOSIS — Z13220 Encounter for screening for lipoid disorders: Secondary | ICD-10-CM

## 2019-03-02 DIAGNOSIS — E785 Hyperlipidemia, unspecified: Secondary | ICD-10-CM

## 2019-03-02 DIAGNOSIS — Z130 Encounter for screening for diseases of the blood and blood-forming organs and certain disorders involving the immune mechanism: Secondary | ICD-10-CM

## 2019-03-02 DIAGNOSIS — Z0001 Encounter for general adult medical examination with abnormal findings: Secondary | ICD-10-CM

## 2019-03-02 DIAGNOSIS — Z0000 Encounter for general adult medical examination without abnormal findings: Secondary | ICD-10-CM | POA: Diagnosis not present

## 2019-03-02 DIAGNOSIS — Z13228 Encounter for screening for other metabolic disorders: Secondary | ICD-10-CM

## 2019-03-02 NOTE — Patient Instructions (Addendum)
   If you have lab work done today you will be contacted with your lab results within the next 2 weeks.  If you have not heard from us then please contact us. The fastest way to get your results is to register for My Chart.   IF you received an x-ray today, you will receive an invoice from [facility]. Please contact [facility] at [phone] with questions or concerns regarding your invoice.   IF you received labwork today, you will receive an invoice from [facility]. Please contact [facility] at [phone] with questions or concerns regarding your invoice.   Our billing staff will not be able to assist you with questions regarding bills from these companies.  You will be contacted with the lab results as soon as they are available. The fastest way to get your results is to activate your My Chart account. Instructions are located on the last page of this paperwork. If you have not heard from us regarding the results in 2 weeks, please contact this office.      Health Maintenance, Female Adopting a healthy lifestyle and getting preventive care are important in promoting health and wellness. Ask your health care provider about:  The right schedule for you to have regular tests and exams.  Things you can do on your own to prevent diseases and keep yourself healthy. What should I know about diet, weight, and exercise? Eat a healthy diet   Eat a diet that includes plenty of vegetables, fruits, low-fat dairy products, and lean protein.  Do not eat a lot of foods that are high in solid fats, added sugars, or sodium. Maintain a healthy weight Body mass index (BMI) is used to identify weight problems. It estimates body fat based on height and weight. Your health care provider can help determine your BMI and help you achieve or maintain a healthy weight. Get regular exercise Get regular exercise. This is one of the most important things you can do for your health. Most  adults should:  Exercise for at least 150 minutes each week. The exercise should increase your heart rate and make you sweat (moderate-intensity exercise).  Do strengthening exercises at least twice a week. This is in addition to the moderate-intensity exercise.  Spend less time sitting. Even light physical activity can be beneficial. Watch cholesterol and blood lipids Have your blood tested for lipids and cholesterol at 83 years of age, then have this test every 5 years. Have your cholesterol levels checked more often if:  Your lipid or cholesterol levels are high.  You are older than 83 years of age.  You are at high risk for heart disease. What should I know about cancer screening? Depending on your health history and family history, you may need to have cancer screening at various ages. This may include screening for:  Breast cancer.  Cervical cancer.  Colorectal cancer.  Skin cancer.  Lung cancer. What should I know about heart disease, diabetes, and high blood pressure? Blood pressure and heart disease  High blood pressure causes heart disease and increases the risk of stroke. This is more likely to develop in people who have high blood pressure readings, are of African descent, or are overweight.  Have your blood pressure checked: ? Every 3-5 years if you are 18-39 years of age. ? Every year if you are 40 years old or older. Diabetes Have regular diabetes screenings. This checks your fasting blood sugar level. Have the screening done:  Once every   three years after age 40 if you are at a normal weight and have a low risk for diabetes.  More often and at a younger age if you are overweight or have a high risk for diabetes. What should I know about preventing infection? Hepatitis B If you have a higher risk for hepatitis B, you should be screened for this virus. Talk with your health care provider to find out if you are at risk for hepatitis B infection. Hepatitis  C Testing is recommended for:  Everyone born from 1945 through 1965.  Anyone with known risk factors for hepatitis C. Sexually transmitted infections (STIs)  Get screened for STIs, including gonorrhea and chlamydia, if: ? You are sexually active and are younger than 83 years of age. ? You are older than 83 years of age and your health care provider tells you that you are at risk for this type of infection. ? Your sexual activity has changed since you were last screened, and you are at increased risk for chlamydia or gonorrhea. Ask your health care provider if you are at risk.  Ask your health care provider about whether you are at high risk for HIV. Your health care provider may recommend a prescription medicine to help prevent HIV infection. If you choose to take medicine to prevent HIV, you should first get tested for HIV. You should then be tested every 3 months for as long as you are taking the medicine. Pregnancy  If you are about to stop having your period (premenopausal) and you may become pregnant, seek counseling before you get pregnant.  Take 400 to 800 micrograms (mcg) of folic acid every day if you become pregnant.  Ask for birth control (contraception) if you want to prevent pregnancy. Osteoporosis and menopause Osteoporosis is a disease in which the bones lose minerals and strength with aging. This can result in bone fractures. If you are 65 years old or older, or if you are at risk for osteoporosis and fractures, ask your health care provider if you should:  Be screened for bone loss.  Take a calcium or vitamin D supplement to lower your risk of fractures.  Be given hormone replacement therapy (HRT) to treat symptoms of menopause. Follow these instructions at home: Lifestyle  Do not use any products that contain nicotine or tobacco, such as cigarettes, e-cigarettes, and chewing tobacco. If you need help quitting, ask your health care provider.  Do not use street  drugs.  Do not share needles.  Ask your health care provider for help if you need support or information about quitting drugs. Alcohol use  Do not drink alcohol if: ? Your health care provider tells you not to drink. ? You are pregnant, may be pregnant, or are planning to become pregnant.  If you drink alcohol: ? Limit how much you use to 0-1 drink a day. ? Limit intake if you are breastfeeding.  Be aware of how much alcohol is in your drink. In the [place] one drink equals one 12 oz bottle of beer (355 mL), one 5 oz glass of wine (148 mL), or one 1 oz glass of hard liquor (44 mL). General instructions  Schedule regular health, dental, and eye exams.  Stay current with your vaccines.  Tell your health care provider if: ? You often feel depressed. ? You have ever been abused or do not feel safe at home. Summary  Adopting a healthy lifestyle and getting preventive care are important in promoting health and wellness.    Follow your health care provider's instructions about healthy diet, exercising, and getting tested or screened for diseases.  Follow your health care provider's instructions on monitoring your cholesterol and blood pressure. This information is not intended to replace advice given to you by your health care provider. Make sure you discuss any questions you have with your health care provider. Document Released: 10/28/2010 Document Revised: 04/07/2018 Document Reviewed: 04/07/2018 Elsevier Patient Education  2020 [employer]  

## 2019-03-02 NOTE — Progress Notes (Addendum)
Monica Ward 85 y.o.   Chief Complaint  Patient presents with  . Annual Exam    HISTORY OF PRESENT ILLNESS: This is a 85 y.o. female here for her annual exam. Has no complaints or medical concerns today. Health maintenance reviewed. Medical history reviewed.  HPI   Prior to Admission medications   Medication Sig Start Date End Date Taking? Authorizing Provider  aspirin EC 81 MG tablet Take 81 mg by mouth daily.   Yes [provider]  Ascorbic Acid (VITAMIN C) 100 MG tablet Take 100 mg by mouth daily as needed (cold ppx).     [provider]  ondansetron (ZOFRAN ODT) 4 MG disintegrating tablet Take 1 tablet (4 mg total) by mouth every 8 (eight) hours as needed for nausea or vomiting. Patient not taking: Reported on 03/02/2019 05/25/18   Petrucelli, Glynda Jaeger, PA-C    No Known Allergies  Patient Active Problem List   Diagnosis Date Noted  . History of hip fracture 10/02/2016  . 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 History of small bowel obstruction 10/02/2016  . Age-related osteoporosis without current pathological fracture 10/02/2016  . Moderate malnutrition ([place]) 02/05/2015  . Thrombocytopenia ([place]) 02/02/2015  . High cholesterol   . Constipation, chronic 02/06/2014  . Diverticulosis 02/03/2014  . B12 deficiency 02/03/2014    Past Medical History:  Diagnosis Date  . Arthritis    "probably" (01/28/2012)  . Cancer ([place]) Around 2002-2003   Unclear history of "7 lb" abdominal tumor treated at [facility] ~2003 s/p open abdominal colonic resection and radiation  . Complication of anesthesia    "trouble waking up; too much med given" (01/28/2012)  . Constipation 02/03/2014  . Constipation, chronic 02/06/2014  . Diverticulitis    Per chart   . Diverticulosis 02/03/2014  . High cholesterol   . 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 Patient had surgery in 2000 and mass which was presumed to be a  leiomyosarcoma rather than a spindle cell cancer. I have no further records of path reports to give me a good definite path diagnosis of her tumor resection.   . Precordial pain 02/12/2012  . SBO (small bowel obstruction) ([place]) 08/2009   Early, partial SBO improved with conservative Tx     Past Surgical History:  Procedure Laterality Date  . ABDOMINAL HYSTERECTOMY  ~ 1985   ovaries resected B; DUB.  Marland Kitchen APPENDECTOMY  1968  . CATARACT EXTRACTION, BILATERAL  09/27/2015  . [place]  . COLECTOMY  2003   "cancer"  . TONSILLECTOMY     "when I was a child"  . TOTAL HIP ARTHROPLASTY Left 01/31/2015   Procedure:  ANTERIOR APPROACH  LEFT HEMI ARTHROPLASTY;  Surgeon: Rod Can, MD;  Location: [facility];  Service: Orthopedics;  Laterality: Left;  . TUMOR REMOVAL  1990's;  X 2   "benign"    Social History   Socioeconomic History  . Marital status: Married    Spouse name: Not on file  . Number of children: 3  . Years of education: Not on file  . Highest education level: Not on file  Occupational History  . Occupation: retired  Scientific laboratory technician  . Financial resource strain: Not on file  . Food insecurity    Worry: Not on file    Inability: Not on file  . Transportation needs    Medical: Not on file    Non-medical: Not on file  Tobacco Use  .  Smoking status: Never Smoker  . Smokeless tobacco: Never Used  Substance and Sexual Activity  . Alcohol use: Yes    Alcohol/week: 3.0 standard drinks    Types: 3 Glasses of wine per week    Comment: social  . Drug use: No  . Sexual activity: Not Currently  Lifestyle  . Physical activity    Days per week: Not on file    Minutes per session: Not on file  . Stress: Not on file  Relationships  . Social Herbalist on phone: Not on file    Gets together: Not on file    Attends religious service: Not on file    Active member of club or organization: Not on file    Attends meetings of clubs or organizations: Not on file     Relationship status: Not on file  . Intimate partner violence    Fear of current or ex partner: Not on file    Emotionally abused: Not on file    Physically abused: Not on file    Forced sexual activity: Not on file  Other Topics Concern  . Not on file  Social History Narrative      Marital status: widowed in 08/2015; married x 60 yyears.      Children: 3 children in area; 4 grandchildren; no gg.      Lives: alone in [place].       Employment: Retired      Tobacco: smoked briefly in college      Alcohol: 1-2 glass of wine per week.      Exercise:  Very little in 2018; cleans house weekly; takes care garden in back and flowers.      ADLs: performs all ADLs; still drives.  No assistant devices.  Cleans townhome.  Does own grocery shopping.  Pay bills.        Advanced Directives:  FULL CODE; no prolonged measures.  HCPOA: son/David Sivertsen          Family History  Problem Relation Age of Onset  . Heart failure Father 37       Heart failure  . Heart disease Father      Review of Systems  Constitutional: Negative.  Negative for chills, fever and weight loss.  HENT: Negative for congestion and sore throat.   Respiratory: Negative.  Negative for cough and shortness of breath.   Cardiovascular: Negative.  Negative for chest pain.  Gastrointestinal: Negative.  Negative for abdominal pain, blood in stool, diarrhea, melena, nausea and vomiting.  Genitourinary: Negative.  Negative for dysuria and hematuria.  Musculoskeletal: Negative.   Skin: Negative.  Negative for rash.  Neurological: Negative.  Negative for dizziness and headaches.  All other systems reviewed and are negative.   Vitals:   03/02/19 1537  BP: (!) 145/60  Pulse: 62  Resp: 16  Temp: (!) 97.5 F (36.4 C)  SpO2: 97%    Physical Exam Vitals signs reviewed.  Constitutional:      Appearance: Normal appearance.  HENT:     Head: Normocephalic.  Eyes:     Extraocular Movements: Extraocular movements intact.      Conjunctiva/sclera: Conjunctivae normal.     Pupils: Pupils are equal, round, and reactive to light.  Neck:     Musculoskeletal: Normal range of motion and neck supple.  Cardiovascular:     Rate and Rhythm: Normal rate and regular rhythm.     Pulses: Normal pulses.     Heart sounds: Normal  heart sounds.  Pulmonary:     Effort: Pulmonary effort is normal.     Breath sounds: Normal breath sounds.  Abdominal:     General: There is no distension.     Palpations: Abdomen is soft. There is no mass.     Tenderness: There is no abdominal tenderness. There is no rebound.  Musculoskeletal: Normal range of motion.  Lymphadenopathy:     Cervical: No cervical adenopathy.  Skin:    General: Skin is warm and dry.     Capillary Refill: Capillary refill takes less than 2 seconds.  Neurological:     General: No focal deficit present.     Mental Status: She is alert and oriented to person, place, and time.  Psychiatric:        Mood and Affect: Mood normal.        Behavior: Behavior normal.      ASSESSMENT & PLAN: Monica Ward was seen today for annual exam.  Diagnoses and all orders for this visit:  Routine general medical examination at a health care facility  Screening for deficiency anemia -     CBC with Differential  Screening for lipoid disorders -     Lipid panel  Screening for endocrine, metabolic and immunity disorder -     Comprehensive metabolic panel  Dyslipidemia     Patient Instructions       If you have lab work done today you will be contacted with your lab results within the next 2 weeks.  If you have not heard from [place] then please contact us. The fastest way to get your results is to register for My Chart.   IF you received an x-ray today, you will receive an invoice from [facility]. Please contact [facility] at [phone] with questions or concerns regarding your invoice.   IF you received labwork today, you will receive an invoice  from [facility]. Please contact [facility] at [phone] with questions or concerns regarding your invoice.   Our billing staff will not be able to assist you with questions regarding bills from these companies.  You will be contacted with the lab results as soon as they are available. The fastest way to get your results is to activate your My Chart account. Instructions are located on the last page of this paperwork. If you have not heard from [place] regarding the results in 2 weeks, please contact this office.      Health Maintenance, Female Adopting a healthy lifestyle and getting preventive care are important in promoting health and wellness. Ask your health care provider about:  The right schedule for you to have regular tests and exams.  Things you can do on your own to prevent diseases and keep yourself healthy. What should I know about diet, weight, and exercise? Eat a healthy diet   Eat a diet that includes plenty of vegetables, fruits, low-fat dairy products, and lean protein.  Do not eat a lot of foods that are high in solid fats, added sugars, or sodium. Maintain a healthy weight Body mass index (BMI) is used to identify weight problems. It estimates body fat based on height and weight. Your health care provider can help determine your BMI and help you achieve or maintain a healthy weight. Get regular exercise Get regular exercise. This is one of the most important things you can do for your health. Most adults should:  Exercise for at least 150 minutes each week. The exercise should increase your heart rate and make  you sweat (moderate-intensity exercise).  Do strengthening exercises at least twice a week. This is in addition to the moderate-intensity exercise.  Spend less time sitting. Even light physical activity can be beneficial. Watch cholesterol and blood lipids Have your blood tested for lipids and cholesterol at 83 years of age, then have this test every 5 years. Have  your cholesterol levels checked more often if:  Your lipid or cholesterol levels are high.  You are older than 83 years of age.  You are at high risk for heart disease. What should I know about cancer screening? Depending on your health history and family history, you may need to have cancer screening at various ages. This may include screening for:  Breast cancer.  Cervical cancer.  Colorectal cancer.  Skin cancer.  Lung cancer. What should I know about heart disease, diabetes, and high blood pressure? Blood pressure and heart disease  High blood pressure causes heart disease and increases the risk of stroke. This is more likely to develop in people who have high blood pressure readings, are of African descent, or are overweight.  Have your blood pressure checked: ? Every 3-5 years if you are 36-58 years of age. ? Every year if you are 28 years old or older. Diabetes Have regular diabetes screenings. This checks your fasting blood sugar level. Have the screening done:  Once every three years after age 13 if you are at a normal weight and have a low risk for diabetes.  More often and at a younger age if you are overweight or have a high risk for diabetes. What should I know about preventing infection? Hepatitis B If you have a higher risk for hepatitis B, you should be screened for this virus. Talk with your health care provider to find out if you are at risk for hepatitis B infection. Hepatitis C Testing is recommended for:  Everyone born from [birth year] through 1963.  Anyone with known risk factors for hepatitis C. Sexually transmitted infections (STIs)  Get screened for STIs, including gonorrhea and chlamydia, if: ? You are sexually active and are younger than 83 years of age. ? You are older than 83 years of age and your health care provider tells you that you are at risk for this type of infection. ? Your sexual activity has changed since you were last screened, and you  are at increased risk for chlamydia or gonorrhea. Ask your health care provider if you are at risk.  Ask your health care provider about whether you are at high risk for HIV. Your health care provider may recommend a prescription medicine to help prevent HIV infection. If you choose to take medicine to prevent HIV, you should first get tested for HIV. You should then be tested every 3 months for as long as you are taking the medicine. Pregnancy  If you are about to stop having your period (premenopausal) and you may become pregnant, seek counseling before you get pregnant.  Take 400 to 800 micrograms (mcg) of folic acid every day if you become pregnant.  Ask for birth control (contraception) if you want to prevent pregnancy. Osteoporosis and menopause Osteoporosis is a disease in which the bones lose minerals and strength with aging. This can result in bone fractures. If you are 32 years old or older, or if you are at risk for osteoporosis and fractures, ask your health care provider if you should:  Be screened for bone loss.  Take a calcium or vitamin D supplement  to lower your risk of fractures.  Be given hormone replacement therapy (HRT) to treat symptoms of menopause. Follow these instructions at home: Lifestyle  Do not use any products that contain nicotine or tobacco, such as cigarettes, e-cigarettes, and chewing tobacco. If you need help quitting, ask your health care provider.  Do not use street drugs.  Do not share needles.  Ask your health care provider for help if you need support or information about quitting drugs. Alcohol use  Do not drink alcohol if: ? Your health care provider tells you not to drink. ? You are pregnant, may be pregnant, or are planning to become pregnant.  If you drink alcohol: ? Limit how much you use to 0-1 drink a day. ? Limit intake if you are breastfeeding.  Be aware of how much alcohol is in your drink. In the [place] one drink equals one 12  oz bottle of beer (355 mL), one 5 oz glass of wine (148 mL), or one 1 oz glass of hard liquor (44 mL). General instructions  Schedule regular health, dental, and eye exams.  Stay current with your vaccines.  Tell your health care provider if: ? You often feel depressed. ? You have ever been abused or do not feel safe at home. Summary  Adopting a healthy lifestyle and getting preventive care are important in promoting health and wellness.  Follow your health care provider's instructions about healthy diet, exercising, and getting tested or screened for diseases.  Follow your health care provider's instructions on monitoring your cholesterol and blood pressure. This information is not intended to replace advice given to you by your health care provider. Make sure you discuss any questions you have with your health care provider. Document Released: 10/28/2010 Document Revised: 04/07/2018 Document Reviewed: 04/07/2018 Elsevier Patient Education  2020 Elsevier Inc.      Agustina Caroli, MD [facility]

## 2019-03-03 LAB — LIPID PANEL
Chol/HDL Ratio: 3.2 ratio (ref 0.0–4.4)
Cholesterol, Total: 212 mg/dL — ABNORMAL HIGH (ref 100–199)
HDL: 66 mg/dL (ref >39)
LDL Chol Calc (NIH): 132 mg/dL — ABNORMAL HIGH (ref 0–99)
Triglycerides: 77 mg/dL (ref 0–149)
VLDL Cholesterol Cal: 14 mg/dL (ref 5–40)

## 2019-03-03 LAB — CBC WITH DIFFERENTIAL/PLATELET
Basophils Absolute: 0 10*3/uL (ref 0.0–0.2)
Basos: 0 %
EOS (ABSOLUTE): 0.1 10*3/uL (ref 0.0–0.4)
Eos: 1 %
Hematocrit: 39.1 % (ref 34.0–46.6)
Hemoglobin: 13.1 g/dL (ref 11.1–15.9)
Immature Grans (Abs): 0 10*3/uL (ref 0.0–0.1)
Immature Granulocytes: 0 %
Lymphocytes Absolute: 1.4 10*3/uL (ref 0.7–3.1)
Lymphs: 25 %
MCH: 31.3 pg (ref 26.6–33.0)
MCHC: 33.5 g/dL (ref 31.5–35.7)
MCV: 93 fL (ref 79–97)
Monocytes Absolute: 0.6 10*3/uL (ref 0.1–0.9)
Monocytes: 11 %
Neutrophils Absolute: 3.5 10*3/uL (ref 1.4–7.0)
Neutrophils: 63 %
Platelets: 300 10*3/uL (ref 150–450)
RBC: 4.19 x10E6/uL (ref 3.77–5.28)
RDW: 12.5 % (ref 11.7–15.4)
WBC: 5.5 10*3/uL (ref 3.4–10.8)

## 2019-03-03 LAB — COMPREHENSIVE METABOLIC PANEL
ALT: 16 IU/L (ref 0–32)
AST: 20 IU/L (ref 0–40)
Albumin/Globulin Ratio: 1.9 (ref 1.2–2.2)
Albumin: 4.3 g/dL (ref 3.6–4.6)
Alkaline Phosphatase: 70 IU/L (ref 39–117)
BUN/Creatinine Ratio: 34 — ABNORMAL HIGH (ref 12–28)
BUN: 21 mg/dL (ref 8–27)
Bilirubin Total: <0.2 mg/dL (ref 0.0–1.2)
CO2: 24 mmol/L (ref 20–29)
Calcium: 9.3 mg/dL (ref 8.7–10.3)
Chloride: 107 mmol/L — ABNORMAL HIGH (ref 96–106)
Creatinine, Ser: 0.62 mg/dL (ref 0.57–1.00)
GFR calc Af Amer: 94 mL/min/{1.73_m2} (ref >59)
GFR calc non Af Amer: 81 mL/min/{1.73_m2} (ref >59)
Globulin, Total: 2.3 g/dL (ref 1.5–4.5)
Glucose: 91 mg/dL (ref 65–99)
Potassium: 4.2 mmol/L (ref 3.5–5.2)
Sodium: 144 mmol/L (ref 134–144)
Total Protein: 6.6 g/dL (ref 6.0–8.5)

## 2019-03-17 ENCOUNTER — Encounter: Payer: Self-pay | Admitting: Radiology

## 2019-05-02 ENCOUNTER — Ambulatory Visit (INDEPENDENT_AMBULATORY_CARE_PROVIDER_SITE_OTHER): Payer: Medicare Other | Admitting: Emergency Medicine

## 2019-05-02 VITALS — BP 145/60 | Ht 68.0 in | Wt 110.0 lb

## 2019-05-02 DIAGNOSIS — Z0000 Encounter for general adult medical examination without abnormal findings: Secondary | ICD-10-CM

## 2019-05-02 NOTE — Progress Notes (Signed)
Presents today for [employer] Visit   Date of last exam: 03/02/2019  Interpreter used for this visit? No  I connected with  Monica Ward on 05/02/19 by a  telepheone  and verified that I am speaking with the correct person using two identifiers.   I discussed the limitations of evaluation and management by telemedicine. The patient expressed understanding and agreed to proceed.   Patient Care Team: Horald Pollen, MD as PCP - General (Internal Medicine)   Other items to address today:   Discussed immunizations Discussed Eye/Dental   Other Screening: Last screening for diabetes: 10/14/2017 Last lipid screening: 03/02/2019  ADVANCE DIRECTIVES: Discussed:  yes On File: no Materials Provided:  no  Immunization status:  Immunization History  Administered Date(s) Administered  . Influenza Whole 01/27/2012  . Influenza-Unspecified 01/29/2013, 01/29/2016, 12/30/2017  . Pneumococcal Conjugate-13 11/22/2013  . Td 09/29/2017     There are no preventive care reminders to display for this patient.   Functional Status Survey: Is the patient deaf or have difficulty hearing?: No Does the patient have difficulty seeing, even when wearing glasses/contacts?: No Does the patient have difficulty concentrating, remembering, or making decisions?: No Does the patient have difficulty walking or climbing stairs?: No Does the patient have difficulty dressing or bathing?: No Does the patient have difficulty doing errands alone such as visiting a doctor's office or shopping?: No   6CIT Screen 05/02/2019  What Year? 0 points  What month? 3 points  What time? 0 points  Count back from 20 0 points  Months in reverse 0 points  Repeat phrase 4 points  Total Score 7        Clinical Support from 05/02/2019 in [facility]  AUDIT-C Score  0       Home Environment:   Lives in one story home No scattered rugs No trouble climbing stairs Yes grab  bars Adequate lighting/no clutter  Timed warm up  N/A   Patient Active Problem List   Diagnosis Date Noted  . History of hip fracture 10/02/2016  . 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 History of small bowel obstruction 10/02/2016  . Age-related osteoporosis without current pathological fracture 10/02/2016  . Moderate malnutrition ([place]) 02/05/2015  . Thrombocytopenia ([place]) 02/02/2015  . High cholesterol   . Constipation, chronic 02/06/2014  . Diverticulosis 02/03/2014  . B12 deficiency 02/03/2014     Past Medical History:  Diagnosis Date  . Arthritis    "probably" (01/28/2012)  . Cancer ([place]) Around 2002-2003   Unclear history of "7 lb" abdominal tumor treated at [facility] ~2003 s/p open abdominal colonic resection and radiation  . Complication of anesthesia    "trouble waking up; too much med given" (01/28/2012)  . Constipation 02/03/2014  . Constipation, chronic 02/06/2014  . Diverticulitis    Per chart   . Diverticulosis 02/03/2014  . High cholesterol   . 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 Patient had surgery in 2000 and mass which was presumed to be a leiomyosarcoma rather than a spindle cell cancer. I have no further records of path reports to give me a good definite path diagnosis of her tumor resection.   . Precordial pain 02/12/2012  . SBO (small bowel obstruction) ([place]) 08/2009   Early, partial SBO improved with conservative Tx      Past Surgical History:  Procedure Laterality Date  . ABDOMINAL HYSTERECTOMY  ~ 1985  ovaries resected B; DUB.  Marland Kitchen APPENDECTOMY  1968  . CATARACT EXTRACTION, BILATERAL  09/27/2015  . [place]  . COLECTOMY  2003   "cancer"  . TONSILLECTOMY     "when I was a child"  . TOTAL HIP ARTHROPLASTY Left 01/31/2015   Procedure:  ANTERIOR APPROACH  LEFT HEMI ARTHROPLASTY;  Surgeon: Rod Can, MD;  Location: [facility];  Service: Orthopedics;  Laterality: Left;  . TUMOR REMOVAL   1990's;  X 2   "benign"     Family History  Problem Relation Age of Onset  . Heart failure Father 36       Heart failure  . Heart disease Father      Social History   Socioeconomic History  . Marital status: Married    Spouse name: Not on file  . Number of children: 3  . Years of education: Not on file  . Highest education level: Not on file  Occupational History  . Occupation: retired  Tobacco Use  . Smoking status: Never Smoker  . Smokeless tobacco: Never Used  Substance and Sexual Activity  . Alcohol use: Yes    Alcohol/week: 3.0 standard drinks    Types: 3 Glasses of wine per week    Comment: social  . Drug use: No  . Sexual activity: Not Currently  Other Topics Concern  . Not on file  Social History Narrative      Marital status: widowed in 08/2015; married x 60 yyears.      Children: 3 children in area; 4 grandchildren; no gg.      Lives: alone in [place].       Employment: Retired      Tobacco: smoked briefly in college      Alcohol: 1-2 glass of wine per week.      Exercise:  Very little in 2018; cleans house weekly; takes care garden in back and flowers.      ADLs: performs all ADLs; still drives.  No assistant devices.  Cleans townhome.  Does own grocery shopping.  Pay bills.        Advanced Directives:  FULL CODE; no prolonged measures.  HCPOA: son/David Mundo         Social Determinants of Health   Financial Resource Strain:   . Difficulty of Paying Living Expenses: Not on file  Food Insecurity:   . Worried About Charity fundraiser in the Last Year: Not on file  . Ran Out of Food in the Last Year: Not on file  Transportation Needs:   . Lack of Transportation (Medical): Not on file  . Lack of Transportation (Non-Medical): Not on file  Physical Activity:   . Days of Exercise per Week: Not on file  . Minutes of Exercise per Session: Not on file  Stress:   . Feeling of Stress : Not on file  Social Connections:   . Frequency of Communication  with Friends and Family: Not on file  . Frequency of Social Gatherings with Friends and Family: Not on file  . Attends Religious Services: Not on file  . Active Member of Clubs or Organizations: Not on file  . Attends Archivist Meetings: Not on file  . Marital Status: Not on file  Intimate Partner Violence:   . Fear of Current or Ex-Partner: Not on file  . Emotionally Abused: Not on file  . Physically Abused: Not on file  . Sexually Abused: Not on file     No  Known Allergies   Prior to Admission medications   Medication Sig Start Date End Date Taking? Authorizing Provider  aspirin EC 81 MG tablet Take 81 mg by mouth daily.   Yes [provider]  Ascorbic Acid (VITAMIN C) 100 MG tablet Take 100 mg by mouth daily as needed (cold ppx).     [provider]  ondansetron (ZOFRAN ODT) 4 MG disintegrating tablet Take 1 tablet (4 mg total) by mouth every 8 (eight) hours as needed for nausea or vomiting. Patient not taking: Reported on 03/02/2019 05/25/18   Amaryllis Dyke, PA-C     Depression screen [facility] 2/9 05/02/2019 03/02/2019 10/14/2017 09/29/2017 04/14/2017  Decreased Interest 0 0 0 0 0  Down, Depressed, Hopeless 0 0 0 0 0  PHQ - 2 Score 0 0 0 0 0     Fall Risk  05/02/2019 03/02/2019 10/14/2017 09/29/2017 04/14/2017  Falls in the past year? 0 0 No No No  Number falls in past yr: - - - - -  Injury with Fall? 0 - - - -  Follow up Falls evaluation completed;Education provided Falls evaluation completed - - -      PHYSICAL EXAM: BP (!) 145/60 Comment: taken from previous visit  Ht 5\' 8"  (1.727 m)   Wt 110 lb (49.9 kg)   BMI 16.73 kg/m    Wt Readings from Last 3 Encounters:  05/02/19 110 lb (49.9 kg)  03/02/19 110 lb 9.6 oz (50.2 kg)  05/25/18 110 lb (49.9 kg)    Medicare annual wellness visit, subsequent    Education/Counseling provided regarding diet and exercise, prevention of chronic diseases, smoking/tobacco cessation, if applicable, and  reviewed "Covered Medicare Preventive Services."

## 2019-05-02 NOTE — Patient Instructions (Addendum)
Thank you for taking time to come for your Medicare Wellness Visit. I appreciate your ongoing commitment to your health goals. Please review the following plan we discussed and let me know if I can assist you in the future.    LPN   Preventive Care 65 Years and Older, Female Preventive care refers to lifestyle choices and visits with your health care provider that can promote health and wellness. This includes:  A yearly physical exam. This is also called an annual well check.  Regular dental and eye exams.  Immunizations.  Screening for certain conditions.  Healthy lifestyle choices, such as diet and exercise. What can I expect for my preventive care visit? Physical exam Your health care provider will check:  Height and weight. These may be used to calculate body mass index (BMI), which is a measurement that tells if you are at a healthy weight.  Heart rate and blood pressure.  Your skin for abnormal spots. Counseling Your health care provider may ask you questions about:  Alcohol, tobacco, and drug use.  Emotional well-being.  Home and relationship well-being.  Sexual activity.  Eating habits.  History of falls.  Memory and ability to understand (cognition).  Work and work environment.  Pregnancy and menstrual history. What immunizations do I need?  Influenza (flu) vaccine  This is recommended every year. Tetanus, diphtheria, and pertussis (Tdap) vaccine  You may need a Td booster every 10 years. Varicella (chickenpox) vaccine  You may need this vaccine if you have not already been vaccinated. Zoster (shingles) vaccine  You may need this after age 60. Pneumococcal conjugate (PCV13) vaccine  One dose is recommended after age 65. Pneumococcal polysaccharide (PPSV23) vaccine  One dose is recommended after age 65. Measles, mumps, and rubella (MMR) vaccine  You may need at least one dose of MMR if you were born in 1957 or later. You may also  need a second dose. Meningococcal conjugate (MenACWY) vaccine  You may need this if you have certain conditions. Hepatitis A vaccine  You may need this if you have certain conditions or if you travel or work in places where you may be exposed to hepatitis A. Hepatitis B vaccine  You may need this if you have certain conditions or if you travel or work in places where you may be exposed to hepatitis B. Haemophilus influenzae type b (Hib) vaccine  You may need this if you have certain conditions. You may receive vaccines as individual doses or as more than one vaccine together in one shot (combination vaccines). Talk with your health care provider about the risks and benefits of combination vaccines. What tests do I need? Blood tests  Lipid and cholesterol levels. These may be checked every 5 years, or more frequently depending on your overall health.  Hepatitis C test.  Hepatitis B test. Screening  Lung cancer screening. You may have this screening every year starting at age 55 if you have a 30-pack-year history of smoking and currently smoke or have quit within the past 15 years.  Colorectal cancer screening. All adults should have this screening starting at age 50 and continuing until age 75. Your health care provider may recommend screening at age 45 if you are at increased risk. You will have tests every 1-10 years, depending on your results and the type of screening test.  Diabetes screening. This is done by checking your blood sugar (glucose) after you have not eaten for a while (fasting). You may have this done every   1-3 years.  Mammogram. This may be done every 1-2 years. Talk with your health care provider about how often you should have regular mammograms.  BRCA-related cancer screening. This may be done if you have a family history of breast, ovarian, tubal, or peritoneal cancers. Other tests  Sexually transmitted disease (STD) testing.  Bone density scan. This is done  to screen for osteoporosis. You may have this done starting at age 21. Follow these instructions at home: Eating and drinking  Eat a diet that includes fresh fruits and vegetables, whole grains, lean protein, and low-fat dairy products. Limit your intake of foods with high amounts of sugar, saturated fats, and salt.  Take vitamin and mineral supplements as recommended by your health care provider.  Do not drink alcohol if your health care provider tells you not to drink.  If you drink alcohol: ? Limit how much you have to 0-1 drink a day. ? Be aware of how much alcohol is in your drink. In the [place] one drink equals one 12 oz bottle of beer (355 mL), one 5 oz glass of wine (148 mL), or one 1 oz glass of hard liquor (44 mL). Lifestyle  Take daily care of your teeth and gums.  Stay active. Exercise for at least 30 minutes on 5 or more days each week.  Do not use any products that contain nicotine or tobacco, such as cigarettes, e-cigarettes, and chewing tobacco. If you need help quitting, ask your health care provider.  If you are sexually active, practice safe sex. Use a condom or other form of protection in order to prevent STIs (sexually transmitted infections).  Talk with your health care provider about taking a low-dose aspirin or statin. What's next?  Go to your health care provider once a year for a well check visit.  Ask your health care provider how often you should have your eyes and teeth checked.  Stay up to date on all vaccines. This information is not intended to replace advice given to you by your health care provider. Make sure you discuss any questions you have with your health care provider. Document Revised: 04/08/2018 Document Reviewed: 04/08/2018 Elsevier Patient Education  2020 [employer].

## 2019-12-27 ENCOUNTER — Other Ambulatory Visit: Payer: Self-pay | Admitting: Emergency Medicine

## 2019-12-27 ENCOUNTER — Telehealth: Payer: Self-pay

## 2019-12-27 NOTE — Telephone Encounter (Signed)
Called and left patient a message to return the call.

## 2019-12-27 NOTE — Telephone Encounter (Signed)
Call and inquire about her condition.  What is going on?  Why is she nauseous?  What does she need the medication for?  Thanks.

## 2019-12-27 NOTE — Telephone Encounter (Signed)
   Notes to clinic:  medication filled by a different provider Review for refill   Requested Prescriptions  Pending Prescriptions Disp Refills   ondansetron (ZOFRAN ODT) 4 MG disintegrating tablet 5 tablet 0    Sig: Take 1 tablet (4 mg total) by mouth every 8 (eight) hours as needed for nausea or vomiting.      Not Delegated - Gastroenterology: Antiemetics Failed - 12/27/2019 10:05 AM      Failed - This refill cannot be delegated      Failed - Valid encounter within last 6 months    Recent Outpatient Visits           7 months ago Medicare annual wellness visit, subsequent   [facility], Ines Bloomer, MD   10 months ago Routine general medical examination at a health care facility   [facility], Ines Bloomer, MD   2 years ago Routine general medical examination at a health care facility   [facility], Ines Bloomer, MD   2 years ago Puncture wound of left hand with foreign body, initial encounter   [facility] at [facility], Ines Bloomer, MD   2 years ago Generalized abdominal pain   Primary Care at [facility], Ines Bloomer, MD

## 2019-12-27 NOTE — Telephone Encounter (Signed)
PT need a refill  ondansetron (ZOFRAN ODT) 4 MG disintegrating tablet [[id number]] [id number] [place], [place]  [address] [place] [address]  Phone: [phone] Fax: [phone]

## 2019-12-27 NOTE — Telephone Encounter (Signed)
Patient is requesting a refill of the following medications: Requested Prescriptions   Pending Prescriptions Disp Refills  . ondansetron (ZOFRAN ODT) 4 MG disintegrating tablet 5 tablet 0    Sig: Take 1 tablet (4 mg total) by mouth every 8 (eight) hours as needed for nausea or vomiting.    Date of patient request:12/27/2019 Last office visit: 05/02/2019 Date of last refill: 05/25/2018 Last refill amount: 5 tablets  Follow up time period per chart: NONE

## 2019-12-27 NOTE — Telephone Encounter (Signed)
She should not wait to see me tomorrow to be evaluated.  These symptoms in an 86 year old need immediate evaluation in an emergency department or urgent care center.  Cancel the appointment and encourage her to be seen today.

## 2019-12-27 NOTE — Telephone Encounter (Signed)
See other message

## 2019-12-28 ENCOUNTER — Other Ambulatory Visit: Payer: Self-pay

## 2019-12-28 ENCOUNTER — Encounter: Payer: Self-pay | Admitting: Emergency Medicine

## 2019-12-28 ENCOUNTER — Telehealth (INDEPENDENT_AMBULATORY_CARE_PROVIDER_SITE_OTHER): Payer: Medicare Other | Admitting: Emergency Medicine

## 2019-12-28 VITALS — Ht 67.0 in | Wt 105.0 lb

## 2019-12-28 DIAGNOSIS — R110 Nausea: Secondary | ICD-10-CM | POA: Diagnosis not present

## 2019-12-28 DIAGNOSIS — R1084 Generalized abdominal pain: Secondary | ICD-10-CM

## 2019-12-28 NOTE — Progress Notes (Signed)
Telemedicine Encounter- SOAP NOTE Established Patient  This telephone encounter was conducted with the patient's (or proxy's) verbal consent via audio telecommunications: yes/no: Yes Patient was instructed to have this encounter in a suitably private space; and to only have persons present to whom they give permission to participate. In addition, patient identity was confirmed by use of name plus two identifiers (DOB and address).  I discussed the limitations, risks, security and privacy concerns of performing an evaluation and management service by telephone and the availability of in person appointments. I also discussed with the patient that there may be a patient responsible charge related to this service. The patient expressed understanding and agreed to proceed.  I spent a total of TIME; 0 MIN TO 60 MIN: 20 minutes talking with the patient or their proxy.  Chief Complaint  Patient presents with  . Abdominal Pain    530am yesterday and today.  . Nausea    started yesterday     Subjective   Monica Ward is a 86 y.o. female established patient. Telephone visit today complaining of progressive diffuse abdominal pain that started 1 week ago and is not getting better.  Has nausea without vomiting.  No diarrhea.  Denies fever or chills.  Son helping with history.  No other significant symptoms. Patient has a history of diverticulosis/diverticulitis, small bowel obstruction, ischemic colitis.  HPI   Patient Active Problem List   Diagnosis Date Noted  . History of hip fracture 10/02/2016  . 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 History of small bowel obstruction 10/02/2016  . Age-related osteoporosis without current pathological fracture 10/02/2016  . Moderate malnutrition ([place]) 02/05/2015  . Thrombocytopenia ([place]) 02/02/2015  . High cholesterol   . Constipation, chronic 02/06/2014  . Diverticulosis 02/03/2014  . B12 deficiency 02/03/2014    Past Medical History:   Diagnosis Date  . Arthritis    "probably" (01/28/2012)  . Cancer ([place]) Around 2002-2003   Unclear history of "7 lb" abdominal tumor treated at [facility] ~2003 s/p open abdominal colonic resection and radiation  . Complication of anesthesia    "trouble waking up; too much med given" (01/28/2012)  . Constipation 02/03/2014  . Constipation, chronic 02/06/2014  . Diverticulitis    Per chart   . Diverticulosis 02/03/2014  . High cholesterol   . 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 Patient had surgery in 2000 and mass which was presumed to be a leiomyosarcoma rather than a spindle cell cancer. I have no further records of path reports to give me a good definite path diagnosis of her tumor resection.   . Precordial pain 02/12/2012  . SBO (small bowel obstruction) ([place]) 08/2009   Early, partial SBO improved with conservative Tx     Current Outpatient Medications  Medication Sig Dispense Refill  . aspirin EC 81 MG tablet Take 81 mg by mouth daily.    . Ascorbic Acid (VITAMIN C) 100 MG tablet Take 100 mg by mouth daily as needed (cold ppx).  (Patient not taking: Reported on 12/28/2019)    . ondansetron (ZOFRAN ODT) 4 MG disintegrating tablet Take 1 tablet (4 mg total) by mouth every 8 (eight) hours as needed for nausea or vomiting. (Patient not taking: Reported on 03/02/2019) 5 tablet 0   No current facility-administered medications for this visit.    No Known Allergies  Social History   Socioeconomic History  . Marital status: Married    Spouse name: Not  on file  . Number of children: 3  . Years of education: Not on file  . Highest education level: Not on file  Occupational History  . Occupation: retired  Tobacco Use  . Smoking status: Never Smoker  . Smokeless tobacco: Never Used  Substance and Sexual Activity  . Alcohol use: Yes    Alcohol/week: 3.0 standard drinks    Types: 3 Glasses of wine per week    Comment: social  . Drug use: No   . Sexual activity: Not Currently  Other Topics Concern  . Not on file  Social History Narrative      Marital status: widowed in 08/2015; married x 60 yyears.      Children: 3 children in area; 4 grandchildren; no gg.      Lives: alone in [place].       Employment: Retired      Tobacco: smoked briefly in college      Alcohol: 1-2 glass of wine per week.      Exercise:  Very little in 2018; cleans house weekly; takes care garden in back and flowers.      ADLs: performs all ADLs; still drives.  No assistant devices.  Cleans townhome.  Does own grocery shopping.  Pay bills.        Advanced Directives:  FULL CODE; no prolonged measures.  HCPOA: son/David Erichsen         Social Determinants of Health   Financial Resource Strain:   . Difficulty of Paying Living Expenses: Not on file  Food Insecurity:   . Worried About Charity fundraiser in the Last Year: Not on file  . Ran Out of Food in the Last Year: Not on file  Transportation Needs:   . Lack of Transportation (Medical): Not on file  . Lack of Transportation (Non-Medical): Not on file  Physical Activity:   . Days of Exercise per Week: Not on file  . Minutes of Exercise per Session: Not on file  Stress:   . Feeling of Stress : Not on file  Social Connections:   . Frequency of Communication with Friends and Family: Not on file  . Frequency of Social Gatherings with Friends and Family: Not on file  . Attends Religious Services: Not on file  . Active Member of Clubs or Organizations: Not on file  . Attends Archivist Meetings: Not on file  . Marital Status: Not on file  Intimate Partner Violence:   . Fear of Current or Ex-Partner: Not on file  . Emotionally Abused: Not on file  . Physically Abused: Not on file  . Sexually Abused: Not on file    Review of Systems  Constitutional: Negative.  Negative for chills and fever.  HENT: Negative.  Negative for congestion and sore throat.   Respiratory: Negative.  Negative  for cough.   Cardiovascular: Negative.  Negative for chest pain.  Gastrointestinal: Positive for abdominal pain and nausea. Negative for blood in stool, melena and vomiting.  Genitourinary: Negative for dysuria.  Skin: Negative.  Negative for rash.  Neurological: Negative.  Negative for dizziness.  All other systems reviewed and are negative.   Objective  Alert and oriented x3 in no apparent respiratory distress Vitals as reported by the patient: Today's Vitals   12/28/19 1412  Weight: 105 lb (47.6 kg)  Height: 5\' 7"  (1.702 m)    There are no diagnoses linked to this encounter.  [place] was seen today for abdominal pain and nausea.  Diagnoses and all orders for this visit:  Generalized abdominal pain  Nausea without vomiting  Advised to go to emergency department or urgent care center now.  I discussed the assessment and treatment plan with the patient. The patient was provided an opportunity to ask questions and all were answered. The patient agreed with the plan and demonstrated an understanding of the instructions.   The patient was advised to call back or seek an in-person evaluation if the symptoms worsen or if the condition fails to improve as anticipated.  I provided 20 minutes of non-face-to-face time during this encounter.  Horald Pollen, MD  Primary Care at [facility]

## 2019-12-28 NOTE — Progress Notes (Signed)
630 am Vitals EMS  BP- 120/76 HR-70 02- 100%  Blood sugar- 161

## 2019-12-28 NOTE — Patient Instructions (Signed)
° ° ° °  If you have lab work done today you will be contacted with your lab results within the next 2 weeks.  If you have not heard from us then please contact us. The fastest way to get your results is to register for My Chart. ° ° °IF you received an x-ray today, you will receive an invoice from [facility]. Please contact [facility] at [phone] with questions or concerns regarding your invoice.  ° °IF you received labwork today, you will receive an invoice from [facility]. Please contact [facility] at [phone] with questions or concerns regarding your invoice.  ° °Our billing staff will not be able to assist you with questions regarding bills from these companies. ° °You will be contacted with the lab results as soon as they are available. The fastest way to get your results is to activate your My Chart account. Instructions are located on the last page of this paperwork. If you have not heard from us regarding the results in 2 weeks, please contact this office. °  ° ° ° °

## 2019-12-28 NOTE — Telephone Encounter (Signed)
Patient has an appointment for today

## 2019-12-29 NOTE — Telephone Encounter (Signed)
Monica Ward called and informed pt after this message.

## 2020-03-09 ENCOUNTER — Telehealth: Payer: Self-pay | Admitting: Emergency Medicine

## 2020-03-09 NOTE — Telephone Encounter (Signed)
Philis Kendall NP with [facility] calling to report she did  [employer] gtest / and patients clock was abnormal and unable to recall 2 out of 3 items .   Tie can be reached at [phone] if you have any questions    Full report will be sent within 2-3 weeks

## 2020-03-09 NOTE — Telephone Encounter (Signed)
Noted.   FYI to provider  

## 2020-04-26 ENCOUNTER — Encounter: Payer: Self-pay | Admitting: Family Medicine

## 2020-04-26 ENCOUNTER — Other Ambulatory Visit: Payer: Self-pay

## 2020-04-26 ENCOUNTER — Ambulatory Visit: Payer: Medicare Other | Admitting: Family Medicine

## 2020-04-26 DIAGNOSIS — L97329 Non-pressure chronic ulcer of left ankle with unspecified severity: Secondary | ICD-10-CM

## 2020-04-26 DIAGNOSIS — R609 Edema, unspecified: Secondary | ICD-10-CM

## 2020-04-26 DIAGNOSIS — R413 Other amnesia: Secondary | ICD-10-CM

## 2020-04-26 DIAGNOSIS — I872 Venous insufficiency (chronic) (peripheral): Secondary | ICD-10-CM

## 2020-04-26 LAB — BASIC METABOLIC PANEL
BUN/Creatinine Ratio: 31 — ABNORMAL HIGH (ref 12–28)
BUN: 19 mg/dL (ref 8–27)
CO2: 22 mmol/L (ref 20–29)
Calcium: 9.2 mg/dL (ref 8.7–10.3)
Chloride: 102 mmol/L (ref 96–106)
Creatinine, Ser: 0.61 mg/dL (ref 0.57–1.00)
GFR calc Af Amer: 94 mL/min/{1.73_m2} (ref >59)
GFR calc non Af Amer: 81 mL/min/{1.73_m2} (ref >59)
Glucose: 89 mg/dL (ref 65–99)
Potassium: 4.3 mmol/L (ref 3.5–5.2)
Sodium: 142 mmol/L (ref 134–144)

## 2020-04-26 LAB — CBC
Hematocrit: 39 % (ref 34.0–46.6)
Hemoglobin: 12.8 g/dL (ref 11.1–15.9)
MCH: 30.5 pg (ref 26.6–33.0)
MCHC: 32.8 g/dL (ref 31.5–35.7)
MCV: 93 fL (ref 79–97)
Platelets: 298 10*3/uL (ref 150–450)
RBC: 4.19 x10E6/uL (ref 3.77–5.28)
RDW: 12.6 % (ref 11.7–15.4)
WBC: 4.8 10*3/uL (ref 3.4–10.8)

## 2020-04-26 MED ORDER — MUPIROCIN 2 % EX OINT: 1.0000 "application " | TOPICAL_OINTMENT | Freq: Three times a day (TID) | CUTANEOUS | 1 refills | Status: DC

## 2020-04-26 NOTE — Progress Notes (Signed)
Patient ID: Monica Ward, female    DOB: Oct 22, 1929  Age: 86 y.o. MRN: [id number]  Chief Complaint  Patient presents with  . Edema    Pt has had edema x 2 weeks note no changes in urination no dietary changes, bilateral legs. Elevation doesn't help much blistering and scabs present pt has not seen any weeping     Subjective:   86 year old lady who has been having problems with the skin on her ankles.  She has had more edema of late.  She has some dark areas and crusted areas that have developed.  She lives independently but her son stays with her and helps her out.  She bathes herself.  She is not on any regular medications.  She denies memory concerns, but constantly forgets what I have told her.  The ankles have on them some dark areas and a crusted area she is concerned about.  They have stopped using compression stockings for fear that it was compressing the circulation too much.  She has had her Covid vaccinations but has not had a booster.    Current allergies, medications, problem list, past/family and social histories reviewed.  Objective:  There were no vitals taken for this visit.  No major acute distress.  Pleasant elderly lady.  Short-term memory is not good.  No carotid bruits.  Chest clear.  Heart regular without murmurs.  1-2+ edema of her ankles but this is early in the day.  The skin has the appearance of having been swollen and is a little wrinkled.  She has some senile ecchymoses of both ankles and a crusted area about 2 to 3 cm in diameter on the left ankle that is not particularly inflamed or infected appearing.  Pedal pulses are present but weak.  Assessment & Plan:   Assessment: 1. Venous stasis dermatitis of both lower extremities   2. Venous stasis ulcer of left ankle without varicose veins, unspecified ulcer stage ([place])   3. Poor short term memory   4. Edema, unspecified type       Plan: See instructions  Orders Placed This Encounter  Procedures   . CBC  . Basic metabolic panel    Meds ordered this encounter  Medications  . mupirocin ointment (BACTROBAN) 2 %    Sig: Apply 1 application topically 3 (three) times daily.    Dispense:  22 g    Refill:  1         Patient Instructions    Try and keep feet elevated.  Gently debrided the crusting after showers.  If there are raw areas apply a small amount of mupirocin ointment once or twice daily, and use a stick free dressing if needed.  Try to check to make sure there are not things in her living area that she bumps her legs into.  I recommend getting the COVID-19 booster  If you develop increased swelling, redness, enlarging sores, or other issues come in to get rechecked.  Sometimes it is good to take a photo on your cell phone to have to compare with from time to time to see whether the places are looking better or worse.  Use compression hose as discussed  Return to see Dr. Mitchel Honour as needed.   If you have lab work done today you will be contacted with your lab results within the next 2 weeks.  If you have not heard from [place] then please contact us. The fastest way to get your results is to register  for My Chart.   IF you received an x-ray today, you will receive an invoice from [facility]. Please contact [facility] at [phone] with questions or concerns regarding your invoice.   IF you received labwork today, you will receive an invoice from [facility]. Please contact [facility] at [phone] with questions or concerns regarding your invoice.   Our billing staff will not be able to assist you with questions regarding bills from these companies.  You will be contacted with the lab results as soon as they are available. The fastest way to get your results is to activate your My Chart account. Instructions are located on the last page of this paperwork. If you have not heard from [place] regarding the results in 2 weeks, please contact this office.         Return if symptoms worsen or fail to improve.   Janace Hoard, MD 04/26/2020

## 2020-04-26 NOTE — Patient Instructions (Addendum)
  Try and keep feet elevated.  Gently debrided the crusting after showers.  If there are raw areas apply a small amount of mupirocin ointment once or twice daily, and use a stick free dressing if needed.  Try to check to make sure there are not things in her living area that she bumps her legs into.  I recommend getting the COVID-19 booster  If you develop increased swelling, redness, enlarging sores, or other issues come in to get rechecked.  Sometimes it is good to take a photo on your cell phone to have to compare with from time to time to see whether the places are looking better or worse.  Use compression hose as discussed  Return to see Dr. Alvy Bimler as needed.   If you have lab work done today you will be contacted with your lab results within the next 2 weeks.  If you have not heard from [place] then please contact us. The fastest way to get your results is to register for My Chart.   IF you received an x-ray today, you will receive an invoice from [facility]. Please contact [facility] at [phone] with questions or concerns regarding your invoice.   IF you received labwork today, you will receive an invoice from [facility]. Please contact [facility] at [phone] with questions or concerns regarding your invoice.   Our billing staff will not be able to assist you with questions regarding bills from these companies.  You will be contacted with the lab results as soon as they are available. The fastest way to get your results is to activate your My Chart account. Instructions are located on the last page of this paperwork. If you have not heard from [place] regarding the results in 2 weeks, please contact this office.

## 2020-07-17 ENCOUNTER — Observation Stay (HOSPITAL_COMMUNITY): Payer: Medicare Other

## 2020-07-17 ENCOUNTER — Emergency Department (HOSPITAL_COMMUNITY): Payer: Medicare Other

## 2020-07-17 ENCOUNTER — Encounter (HOSPITAL_COMMUNITY): Payer: Self-pay

## 2020-07-17 ENCOUNTER — Inpatient Hospital Stay (HOSPITAL_COMMUNITY)
Admission: EM | Admit: 2020-07-17 | Discharge: 2020-07-20 | DRG: 065 | Disposition: A | Discharge status: Skilled Nursing Facility | Payer: Medicare Other | Attending: Internal Medicine | Admitting: Internal Medicine

## 2020-07-17 DIAGNOSIS — Z8589 Personal history of malignant neoplasm of other organs and systems: Secondary | ICD-10-CM

## 2020-07-17 DIAGNOSIS — W19XXXA Unspecified fall, initial encounter: Secondary | ICD-10-CM | POA: Diagnosis not present

## 2020-07-17 DIAGNOSIS — I110 Hypertensive heart disease with heart failure: Secondary | ICD-10-CM | POA: Diagnosis present

## 2020-07-17 DIAGNOSIS — I480 Paroxysmal atrial fibrillation: Secondary | ICD-10-CM | POA: Diagnosis not present

## 2020-07-17 DIAGNOSIS — I2510 Atherosclerotic heart disease of native coronary artery without angina pectoris: Secondary | ICD-10-CM | POA: Diagnosis present

## 2020-07-17 DIAGNOSIS — Z8249 Family history of ischemic heart disease and other diseases of the circulatory system: Secondary | ICD-10-CM

## 2020-07-17 DIAGNOSIS — Z8719 Personal history of other diseases of the digestive system: Secondary | ICD-10-CM

## 2020-07-17 DIAGNOSIS — Z7982 Long term (current) use of aspirin: Secondary | ICD-10-CM

## 2020-07-17 DIAGNOSIS — I5032 Chronic diastolic (congestive) heart failure: Secondary | ICD-10-CM | POA: Diagnosis present

## 2020-07-17 DIAGNOSIS — S81812A Laceration without foreign body, left lower leg, initial encounter: Secondary | ICD-10-CM | POA: Diagnosis present

## 2020-07-17 DIAGNOSIS — I63411 Cerebral infarction due to embolism of right middle cerebral artery: Principal | ICD-10-CM | POA: Diagnosis present

## 2020-07-17 DIAGNOSIS — N390 Urinary tract infection, site not specified: Secondary | ICD-10-CM | POA: Diagnosis present

## 2020-07-17 DIAGNOSIS — I63511 Cerebral infarction due to unspecified occlusion or stenosis of right middle cerebral artery: Secondary | ICD-10-CM | POA: Diagnosis present

## 2020-07-17 DIAGNOSIS — Z96642 Presence of left artificial hip joint: Secondary | ICD-10-CM | POA: Diagnosis present

## 2020-07-17 DIAGNOSIS — S80812A Abrasion, left lower leg, initial encounter: Secondary | ICD-10-CM | POA: Diagnosis present

## 2020-07-17 DIAGNOSIS — I6529 Occlusion and stenosis of unspecified carotid artery: Secondary | ICD-10-CM | POA: Diagnosis present

## 2020-07-17 DIAGNOSIS — R29702 NIHSS score 2: Secondary | ICD-10-CM | POA: Diagnosis present

## 2020-07-17 DIAGNOSIS — E785 Hyperlipidemia, unspecified: Secondary | ICD-10-CM | POA: Diagnosis present

## 2020-07-17 DIAGNOSIS — R54 Age-related physical debility: Secondary | ICD-10-CM | POA: Diagnosis present

## 2020-07-17 DIAGNOSIS — R471 Dysarthria and anarthria: Secondary | ICD-10-CM | POA: Diagnosis present

## 2020-07-17 DIAGNOSIS — S5012XA Contusion of left forearm, initial encounter: Secondary | ICD-10-CM | POA: Diagnosis present

## 2020-07-17 DIAGNOSIS — Z8673 Personal history of transient ischemic attack (TIA), and cerebral infarction without residual deficits: Secondary | ICD-10-CM

## 2020-07-17 DIAGNOSIS — Z20822 Contact with and (suspected) exposure to covid-19: Secondary | ICD-10-CM | POA: Diagnosis present

## 2020-07-17 DIAGNOSIS — Z923 Personal history of irradiation: Secondary | ICD-10-CM

## 2020-07-17 DIAGNOSIS — Z9049 Acquired absence of other specified parts of digestive tract: Secondary | ICD-10-CM

## 2020-07-17 DIAGNOSIS — R451 Restlessness and agitation: Secondary | ICD-10-CM | POA: Diagnosis present

## 2020-07-17 DIAGNOSIS — Z90710 Acquired absence of both cervix and uterus: Secondary | ICD-10-CM

## 2020-07-17 DIAGNOSIS — E7800 Pure hypercholesterolemia, unspecified: Secondary | ICD-10-CM | POA: Diagnosis present

## 2020-07-17 DIAGNOSIS — Y92012 Bathroom of single-family (private) house as the place of occurrence of the external cause: Secondary | ICD-10-CM

## 2020-07-17 DIAGNOSIS — I639 Cerebral infarction, unspecified: Secondary | ICD-10-CM | POA: Diagnosis present

## 2020-07-17 LAB — HEMOGLOBIN A1C
Hgb A1c MFr Bld: 5.6 % (ref 4.8–5.6)
Mean Plasma Glucose: 114.02 mg/dL

## 2020-07-17 LAB — CBC WITH DIFFERENTIAL/PLATELET
Abs Immature Granulocytes: 0.01 10*3/uL (ref 0.00–0.07)
Basophils Absolute: 0 10*3/uL (ref 0.0–0.1)
Basophils Relative: 0 %
Eosinophils Absolute: 0 10*3/uL (ref 0.0–0.5)
Eosinophils Relative: 0 %
HCT: 44.1 % (ref 36.0–46.0)
Hemoglobin: 14.9 g/dL (ref 12.0–15.0)
Immature Granulocytes: 0 %
Lymphocytes Relative: 12 %
Lymphs Abs: 1 10*3/uL (ref 0.7–4.0)
MCH: 31.3 pg (ref 26.0–34.0)
MCHC: 33.8 g/dL (ref 30.0–36.0)
MCV: 92.6 fL (ref 80.0–100.0)
Monocytes Absolute: 0.5 10*3/uL (ref 0.1–1.0)
Monocytes Relative: 6 %
Neutro Abs: 6.8 10*3/uL (ref 1.7–7.7)
Neutrophils Relative %: 82 %
Platelets: 319 10*3/uL (ref 150–400)
RBC: 4.76 MIL/uL (ref 3.87–5.11)
RDW: 14.4 % (ref 11.5–15.5)
WBC: 8.4 10*3/uL (ref 4.0–10.5)
nRBC: 0 % (ref 0.0–0.2)

## 2020-07-17 LAB — COMPREHENSIVE METABOLIC PANEL
ALT: 16 U/L (ref 0–44)
AST: 23 U/L (ref 15–41)
Albumin: 3.9 g/dL (ref 3.5–5.0)
Alkaline Phosphatase: 55 U/L (ref 38–126)
Anion gap: 8 (ref 5–15)
BUN: 21 mg/dL (ref 8–23)
CO2: 26 mmol/L (ref 22–32)
Calcium: 9.5 mg/dL (ref 8.9–10.3)
Chloride: 107 mmol/L (ref 98–111)
Creatinine, Ser: 0.73 mg/dL (ref 0.44–1.00)
GFR, Estimated: >60 mL/min (ref >60)
Glucose, Bld: 97 mg/dL (ref 70–99)
Potassium: 3.6 mmol/L (ref 3.5–5.1)
Sodium: 141 mmol/L (ref 135–145)
Total Bilirubin: 0.4 mg/dL (ref 0.3–1.2)
Total Protein: 6.9 g/dL (ref 6.5–8.1)

## 2020-07-17 LAB — URINALYSIS, ROUTINE W REFLEX MICROSCOPIC
Bilirubin Urine: NEGATIVE
Glucose, UA: NEGATIVE mg/dL
Ketones, ur: NEGATIVE mg/dL
Nitrite: NEGATIVE
Protein, ur: NEGATIVE mg/dL
Specific Gravity, Urine: 1.008 (ref 1.005–1.030)
pH: 6 (ref 5.0–8.0)

## 2020-07-17 LAB — LIPID PANEL
Cholesterol: 243 mg/dL — ABNORMAL HIGH (ref 0–200)
HDL: 68 mg/dL (ref >40)
LDL Cholesterol: 160 mg/dL — ABNORMAL HIGH (ref 0–99)
Total CHOL/HDL Ratio: 3.6 RATIO
Triglycerides: 76 mg/dL (ref <150)
VLDL: 15 mg/dL (ref 0–40)

## 2020-07-17 LAB — LIPASE, BLOOD: Lipase: 27 U/L (ref 11–51)

## 2020-07-17 LAB — CK: Total CK: 81 U/L (ref 38–234)

## 2020-07-17 MED ORDER — ACETAMINOPHEN 160 MG/5ML PO SOLN: 650.0000 mg | ORAL | Status: DC | PRN

## 2020-07-17 MED ORDER — CLOPIDOGREL BISULFATE 75 MG PO TABS: 75.0000 mg | ORAL_TABLET | Freq: Every day | ORAL | Status: DC

## 2020-07-17 MED ORDER — BACITRACIN ZINC 500 UNIT/GM EX OINT
TOPICAL_OINTMENT | Freq: Two times a day (BID) | CUTANEOUS | Status: DC
  Administered 2020-07-17: 2 via TOPICAL
  Filled 2020-07-17: qty 28.4
  Filled 2020-07-17: qty 1.8

## 2020-07-17 MED ORDER — CLOPIDOGREL BISULFATE 75 MG PO TABS
75.0000 mg | ORAL_TABLET | Freq: Every day | ORAL | Status: DC
  Administered 2020-07-17 – 2020-07-19 (×3): 75 mg via ORAL
  Filled 2020-07-17 (×4): qty 1

## 2020-07-17 MED ORDER — STROKE: EARLY STAGES OF RECOVERY BOOK
Freq: Once | Status: AC
  Filled 2020-07-17: qty 1

## 2020-07-17 MED ORDER — MORPHINE SULFATE (PF) 2 MG/ML IV SOLN
2.0000 mg | Freq: Once | INTRAVENOUS | Status: AC
Start: 2020-07-17 — End: 2020-07-17
  Administered 2020-07-17: 2 mg via INTRAVENOUS
  Filled 2020-07-17: qty 1

## 2020-07-17 MED ORDER — SENNOSIDES-DOCUSATE SODIUM 8.6-50 MG PO TABS
1.0000 | ORAL_TABLET | Freq: Every evening | ORAL | Status: DC | PRN
  Administered 2020-07-19: 1 via ORAL
  Filled 2020-07-17: qty 1

## 2020-07-17 MED ORDER — ASPIRIN 300 MG RE SUPP: 300.0000 mg | Freq: Once | RECTAL | Status: DC

## 2020-07-17 MED ORDER — LACTATED RINGERS IV SOLN: INTRAVENOUS | Status: DC

## 2020-07-17 MED ORDER — SODIUM CHLORIDE 0.9 % IV SOLN: INTRAVENOUS | Status: AC

## 2020-07-17 MED ORDER — IOHEXOL 350 MG/ML SOLN
75.0000 mL | Freq: Once | INTRAVENOUS | Status: AC | PRN
  Administered 2020-07-17: 75 mL via INTRAVENOUS

## 2020-07-17 MED ORDER — LORAZEPAM 2 MG/ML IJ SOLN
1.0000 mg | Freq: Once | INTRAMUSCULAR | Status: AC
  Administered 2020-07-17: 1 mg via INTRAVENOUS
  Filled 2020-07-17: qty 1

## 2020-07-17 MED ORDER — ASPIRIN 81 MG PO CHEW
81.0000 mg | CHEWABLE_TABLET | Freq: Every day | ORAL | Status: DC
  Administered 2020-07-18: 81 mg via ORAL
  Filled 2020-07-17: qty 1

## 2020-07-17 MED ORDER — SODIUM CHLORIDE 0.9 % IV SOLN
1.0000 g | Freq: Once | INTRAVENOUS | Status: AC
  Administered 2020-07-17: 1 g via INTRAVENOUS
  Filled 2020-07-17: qty 10

## 2020-07-17 MED ORDER — ENOXAPARIN SODIUM 40 MG/0.4ML ~~LOC~~ SOLN
40.0000 mg | SUBCUTANEOUS | Status: AC
  Administered 2020-07-17 – 2020-07-19 (×3): 40 mg via SUBCUTANEOUS
  Filled 2020-07-17 (×3): qty 0.4

## 2020-07-17 MED ORDER — METOCLOPRAMIDE HCL 5 MG/ML IJ SOLN
10.0000 mg | Freq: Once | INTRAMUSCULAR | Status: AC
  Administered 2020-07-17: 10 mg via INTRAVENOUS
  Filled 2020-07-17: qty 2

## 2020-07-17 MED ORDER — ASPIRIN 325 MG PO TABS
325.0000 mg | ORAL_TABLET | Freq: Once | ORAL | Status: AC
  Administered 2020-07-17: 325 mg via ORAL
  Filled 2020-07-17: qty 1

## 2020-07-17 MED ORDER — ACETAMINOPHEN 650 MG RE SUPP: 650.0000 mg | RECTAL | Status: DC | PRN

## 2020-07-17 MED ORDER — ACETAMINOPHEN 325 MG PO TABS
650.0000 mg | ORAL_TABLET | ORAL | Status: DC | PRN
  Administered 2020-07-18 – 2020-07-19 (×5): 650 mg via ORAL
  Filled 2020-07-17 (×5): qty 2

## 2020-07-17 NOTE — H&P (Signed)
History and Physical    Monica Ward OFB:[id number] DOB: Aug 29, 1929 DOA: 07/17/2020  PCP: Horald Pollen, MD  Patient coming from: Home via EMS  I have personally briefly reviewed patient's old medical records in [place]  Chief Complaint: Fall  HPI: Monica Ward is a 87 y.o. female with medical history significant for hyperlipidemia, SBO, leiomyosarcoma who presents to the ED for evaluation after she was found down at home.  History is limited from patient due to excessive somnolence and dysarthria and is otherwise supplemented by EDP, chart review, and patient's son at bedside.  Patient lives at home with her son.  She was last seen normal around 9:30 PM night of 07/16/2020.  This morning around 9 AM her son found the patient down on the bathroom floor with an abrasion on her anterior left shin as well as bruising on her left upper extremity.  He suspects that she was down for prolonged period throughout the night.  He was able to transfer her to bed and called EMS.  He said she appeared anxious and stated that she needed to use the bathroom but otherwise did not have specific complaints.  He says she is very drowsy now after receiving IV morphine.  During my exam, patient is very lethargic with dysarthric speech.  She is following simple commands but not able to provide significant history.  ED Course:  Initial vitals showed BP 156/78, pulse 70, RR 18, temp 97.5 F, SPO2 99% on room air.  Labs show WBC 8.4, hemoglobin 14.9, platelets 319,000, sodium 141, potassium 3.6, bicarb 26, BUN 21, creatinine 0.73, LFTs within normal limits, lipase 27, CK 81.  Urinalysis shows negative nitrites, small leukocytes, 0-5 RBC/hpf, 0-5 WBC/hpf, many bacteria microscopy.  Urine culture obtained and pending.  Pelvic, left ankle, and right ankle x-rays negative for evidence of fractures.  Abdominal x-ray negative for evidence of bowel obstruction or ileus.  CT head and cervical  spine without contrast are negative for acute abnormalities.  MRI brain without contrast is significantly motion degraded but shows likely acute to subacute right MCA territory infarct.  Neurology were consulted and have ordered aspirin for patient.  The hospitalist service was consulted to admit for further evaluation and management.  Review of Systems:  Unable to obtain full review of systems due to somnolence and dysarthria.   Past Medical History:  Diagnosis Date  . Arthritis    "probably" (01/28/2012)  . Cancer ([place]) Around 2002-2003   Unclear history of "7 lb" abdominal tumor treated at [facility] ~2003 s/p open abdominal colonic resection and radiation  . Complication of anesthesia    "trouble waking up; too much med given" (01/28/2012)  . Constipation 02/03/2014  . Constipation, chronic 02/06/2014  . Diverticulitis    Per chart   . Diverticulosis 02/03/2014  . High cholesterol   . 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 Patient had surgery in 2000 and mass which was presumed to be a leiomyosarcoma rather than a spindle cell cancer. I have no further records of path reports to give me a good definite path diagnosis of her tumor resection.   . Precordial pain 02/12/2012  . SBO (small bowel obstruction) ([place]) 08/2009   Early, partial SBO improved with conservative Tx     Past Surgical History:  Procedure Laterality Date  . ABDOMINAL HYSTERECTOMY  ~ 1985   ovaries resected B; DUB.  [facility] Kitchen APPENDECTOMY  1968  . CATARACT  EXTRACTION, BILATERAL  09/27/2015  . [place]  . COLECTOMY  2003   "cancer"  . TONSILLECTOMY     "when I was a child"  . TOTAL HIP ARTHROPLASTY Left 01/31/2015   Procedure:  ANTERIOR APPROACH  LEFT HEMI ARTHROPLASTY;  Surgeon: Rod Can, MD;  Location: [facility];  Service: Orthopedics;  Laterality: Left;  . TUMOR REMOVAL  1990's;  X 2   "benign"    Social History:  reports that she has never smoked. She  has never used smokeless tobacco. She reports current alcohol use of about 3.0 standard drinks of alcohol per week. She reports that she does not use drugs.  No Known Allergies  Family History  Problem Relation Age of Onset  . Heart failure Father 107       Heart failure  . Heart disease Father      Prior to Admission medications   Medication Sig Start Date End Date Taking? Authorizing Provider  aspirin EC 81 MG tablet Take 81 mg by mouth daily.    [provider]  mupirocin ointment (BACTROBAN) 2 % Apply 1 application topically 3 (three) times daily. 04/26/20   Posey Boyer, MD    Physical Exam: Vitals:   07/17/20 1800 07/17/20 1900 07/17/20 2231 07/17/20 2356  BP: 140/62 (!) 117/49 (!) 178/80 (!) 155/70  Pulse: 62  66 62  Resp: 16 13 18 18   Temp: 97.6 F (36.4 C)  (!) 97.5 F (36.4 C) 98.6 F (37 C)  TempSrc: Oral  Oral Oral  SpO2: 97%  99% 99%  Weight:    49.4 kg  Height:    5\' 4"  (1.626 m)   Constitutional: Thin elderly woman resting supine in bed, somnolent Eyes: PERRL, lids and conjunctivae normal, right gaze preference ENMT: Mucous membranes are dry. Posterior pharynx clear of any exudate or lesions.Normal dentition.  Neck: normal, supple, no masses. Respiratory: clear to auscultation anteriorly, normal respiratory effort. No accessory muscle use.  Cardiovascular: Irregular rhythm, PACs on telemetry., no murmurs / rubs / gallops. No extremity edema. 2+ pedal pulses. Abdomen: no tenderness, no masses palpated. No hepatosplenomegaly.  Musculoskeletal: no clubbing / cyanosis. No joint deformity upper and lower extremities.  ROM right upper lower extremities intact, somewhat diminished left upper extremity. Skin: Ecchymosis left forearm, abrasion with surrounding ecchymosis anterior left lower leg Neurologic: Dysarthric speech, right gaze preference. Sensation intact. Strength 3/5 left upper and lower extremities, 5/5 right upper extremity. Psychiatric:  Somnolent but will awaken and follow simple commands.  Unable to obtain orientation questioning due to dysarthric speech.  Labs on Admission: I have personally reviewed following labs and imaging studies  CBC: Recent Labs  Lab 07/17/20 1222  WBC 8.4  NEUTROABS 6.8  HGB 14.9  HCT 44.1  MCV 92.6  PLT 412   Basic Metabolic Panel: Recent Labs  Lab 07/17/20 1222  NA 141  K 3.6  CL 107  CO2 26  GLUCOSE 97  BUN 21  CREATININE 0.73  CALCIUM 9.5   GFR: Estimated Creatinine Clearance: 37.9 mL/min (by C-G formula based on SCr of 0.73 mg/dL). Liver Function Tests: Recent Labs  Lab 07/17/20 1222  AST 23  ALT 16  ALKPHOS 55  BILITOT 0.4  PROT 6.9  ALBUMIN 3.9   Recent Labs  Lab 07/17/20 1222  LIPASE 27   No results for input(s): AMMONIA in the last 168 hours. Coagulation Profile: No results for input(s): INR, PROTIME in the last 168 hours. Cardiac Enzymes: Recent Labs  Lab 07/17/20 1222  CKTOTAL 81   BNP (last 3 results) No results for input(s): PROBNP in the last 8760 hours. HbA1C: Recent Labs    07/17/20 1222  HGBA1C 5.6   CBG: No results for input(s): GLUCAP in the last 168 hours. Lipid Profile: Recent Labs    07/17/20 1222  CHOL 243*  HDL 68  LDLCALC 160*  TRIG 76  CHOLHDL 3.6   Thyroid Function Tests: No results for input(s): TSH, T4TOTAL, FREET4, T3FREE, THYROIDAB in the last 72 hours. Anemia Panel: No results for input(s): VITAMINB12, FOLATE, FERRITIN, TIBC, IRON, RETICCTPCT in the last 72 hours. Urine analysis:    Component Value Date/Time   COLORURINE YELLOW 07/17/2020 1253   APPEARANCEUR CLEAR 07/17/2020 1253   LABSPEC 1.008 07/17/2020 1253   PHURINE 6.0 07/17/2020 1253   GLUCOSEU NEGATIVE 07/17/2020 1253   HGBUR SMALL (A) 07/17/2020 1253   BILIRUBINUR NEGATIVE 07/17/2020 1253   BILIRUBINUR negative 01/01/2016 0950   BILIRUBINUR neg 12/27/2014 0952   KETONESUR NEGATIVE 07/17/2020 1253   PROTEINUR NEGATIVE 07/17/2020 1253    UROBILINOGEN 0.2 01/01/2016 0950   UROBILINOGEN 1.0 01/31/2015 1140   NITRITE NEGATIVE 07/17/2020 1253   LEUKOCYTESUR SMALL (A) 07/17/2020 1253    Radiological Exams on Admission: CT ANGIO HEAD W OR WO CONTRAST  Result Date: 07/17/2020 CLINICAL DATA:  Infarcts on MRI EXAM: CT ANGIOGRAPHY HEAD AND NECK TECHNIQUE: Multidetector CT imaging of the head and neck was performed using the standard protocol during bolus administration of intravenous contrast. Multiplanar CT image reconstructions and MIPs were obtained to evaluate the vascular anatomy. Carotid stenosis measurements (when applicable) are obtained utilizing NASCET criteria, using the distal internal carotid diameter as the denominator. CONTRAST:  69mL OMNIPAQUE IOHEXOL 350 MG/ML SOLN COMPARISON:  Correlation made with same day brain FINDINGS: CTA NECK Aortic arch: Great vessel origins are patent. Right carotid system: Patent. Mild calcified plaque at the ICA origin causing minimal stenosis. Left carotid system: Patent. Minimal calcified plaque at the ICA origin without stenosis. Vertebral arteries: Patent and codominant. Skeleton: Degenerative changes of the cervical spine. Other neck: Negative. Upper chest: Included upper lungs are clear. Review of the MIP images confirms the above findings CTA HEAD Anterior circulation: Intracranial internal carotid arteries are patent with mild calcified plaque. Right M1 MCA is patent. There is nearly occlusive thrombus within the proximal posterior division resulting in diminished opacification of distal vessels. Left middle and both anterior cerebral arteries are patent. Posterior circulation: Intracranial vertebral arteries, basilar artery, and posterior cerebral arteries are patent. Left posterior communicating artery is present. Moderate stenosis at the proximal right P2 PCA. Venous sinuses: Patent as allowed by contrast bolus timing. Review of the MIP images confirms the above findings IMPRESSION: Nearly  occlusive thrombus within the proximal posterior division of the right MCA. Diminished flow within more distal vessels. Moderate stenosis proximal right P2 PCA. No significant stenosis in the neck. Primary result called by telephone at the time of interpretation on 07/17/2020 at 8:40 pm to provider Pecolia Ades, who verbally acknowledged these results. Electronically Signed   By: Macy Mis M.D.   On: 07/17/2020 20:46   DG Pelvis 1-2 Views  Result Date: 07/17/2020 CLINICAL DATA:  Fall. EXAM: PELVIS - 1-2 VIEW COMPARISON:  January 31, 2015. FINDINGS: Status post left hip arthroplasty. No fracture or dislocation is noted. Mild degenerative changes seen involving the right hip. IMPRESSION: No acute abnormality seen in the pelvis. Electronically Signed   By: Marijo Conception M.D.   On: 07/17/2020  13:58   DG Ankle Complete Left  Result Date: 07/17/2020 CLINICAL DATA:  Fall. EXAM: LEFT ANKLE COMPLETE - 3+ VIEW COMPARISON:  None. FINDINGS: There is no evidence of fracture, dislocation, or joint effusion. There is no evidence of arthropathy or other focal bone abnormality. Soft tissues are unremarkable. IMPRESSION: Negative. Electronically Signed   By: Marijo Conception M.D.   On: 07/17/2020 14:01   DG Ankle Complete Right  Result Date: 07/17/2020 CLINICAL DATA:  Fall. EXAM: RIGHT ANKLE - COMPLETE 3+ VIEW COMPARISON:  None. FINDINGS: There is no evidence of fracture, dislocation, or joint effusion. There is no evidence of arthropathy or other focal bone abnormality. Soft tissues are unremarkable. IMPRESSION: Negative. Electronically Signed   By: Marijo Conception M.D.   On: 07/17/2020 14:01   DG Abdomen 1 View  Result Date: 07/17/2020 CLINICAL DATA:  Fall. EXAM: ABDOMEN - 1 VIEW COMPARISON:  February 04, 2014. FINDINGS: The bowel gas pattern is normal. No radio-opaque calculi or other significant radiographic abnormality are seen. Surgical clips are seen in the pelvis. IMPRESSION: No evidence of bowel  obstruction or ileus. Aortic Atherosclerosis (ICD10-I70.0). Electronically Signed   By: Marijo Conception M.D.   On: 07/17/2020 14:00   CT Head Wo Contrast  Result Date: 07/17/2020 CLINICAL DATA:  Status post fall.  Initial encounter. EXAM: CT HEAD WITHOUT CONTRAST CT CERVICAL SPINE WITHOUT CONTRAST TECHNIQUE: Multidetector CT imaging of the head and cervical spine was performed following the standard protocol without intravenous contrast. Multiplanar CT image reconstructions of the cervical spine were also generated. COMPARISON:  None. FINDINGS: CT HEAD FINDINGS Brain: No evidence of acute infarction, hemorrhage, hydrocephalus, extra-axial collection or mass lesion/mass effect. Atrophy and chronic microvascular ischemic change noted. Vascular: No hyperdense vessel or unexpected calcification. Skull: No fracture or focal lesion. Sinuses/Orbits: Motion limits evaluation. No abnormality is seen. The patient is status post cataract surgery Other: None. CT CERVICAL SPINE FINDINGS Alignment: Trace anterolisthesis C3 on C4. Straightening of lordosis. Skull base and vertebrae: No acute fracture. No primary bone lesion or focal pathologic process. Soft tissues and spinal canal: No prevertebral fluid or swelling. No visible canal hematoma. Disc levels: Mild loss of disc space height and endplate spurring are seen at C5-6. Upper chest: Clear. Other: None. IMPRESSION: No acute abnormality head or cervical spine. Cortical atrophy and chronic microvascular ischemic change. Mild degenerative disc disease C5-6. Electronically Signed   By: Inge Rise M.D.   On: 07/17/2020 14:42   CT ANGIO NECK W OR WO CONTRAST  Result Date: 07/17/2020 CLINICAL DATA:  Infarcts on MRI EXAM: CT ANGIOGRAPHY HEAD AND NECK TECHNIQUE: Multidetector CT imaging of the head and neck was performed using the standard protocol during bolus administration of intravenous contrast. Multiplanar CT image reconstructions and MIPs were obtained to evaluate  the vascular anatomy. Carotid stenosis measurements (when applicable) are obtained utilizing NASCET criteria, using the distal internal carotid diameter as the denominator. CONTRAST:  80mL OMNIPAQUE IOHEXOL 350 MG/ML SOLN COMPARISON:  Correlation made with same day brain FINDINGS: CTA NECK Aortic arch: Great vessel origins are patent. Right carotid system: Patent. Mild calcified plaque at the ICA origin causing minimal stenosis. Left carotid system: Patent. Minimal calcified plaque at the ICA origin without stenosis. Vertebral arteries: Patent and codominant. Skeleton: Degenerative changes of the cervical spine. Other neck: Negative. Upper chest: Included upper lungs are clear. Review of the MIP images confirms the above findings CTA HEAD Anterior circulation: Intracranial internal carotid arteries are patent with mild calcified plaque.  Right M1 MCA is patent. There is nearly occlusive thrombus within the proximal posterior division resulting in diminished opacification of distal vessels. Left middle and both anterior cerebral arteries are patent. Posterior circulation: Intracranial vertebral arteries, basilar artery, and posterior cerebral arteries are patent. Left posterior communicating artery is present. Moderate stenosis at the proximal right P2 PCA. Venous sinuses: Patent as allowed by contrast bolus timing. Review of the MIP images confirms the above findings IMPRESSION: Nearly occlusive thrombus within the proximal posterior division of the right MCA. Diminished flow within more distal vessels. Moderate stenosis proximal right P2 PCA. No significant stenosis in the neck. Primary result called by telephone at the time of interpretation on 07/17/2020 at 8:40 pm to provider Pecolia Ades, who verbally acknowledged these results. Electronically Signed   By: Macy Mis M.D.   On: 07/17/2020 20:46   CT Cervical Spine Wo Contrast  Result Date: 07/17/2020 CLINICAL DATA:  Status post fall.  Initial  encounter. EXAM: CT HEAD WITHOUT CONTRAST CT CERVICAL SPINE WITHOUT CONTRAST TECHNIQUE: Multidetector CT imaging of the head and cervical spine was performed following the standard protocol without intravenous contrast. Multiplanar CT image reconstructions of the cervical spine were also generated. COMPARISON:  None. FINDINGS: CT HEAD FINDINGS Brain: No evidence of acute infarction, hemorrhage, hydrocephalus, extra-axial collection or mass lesion/mass effect. Atrophy and chronic microvascular ischemic change noted. Vascular: No hyperdense vessel or unexpected calcification. Skull: No fracture or focal lesion. Sinuses/Orbits: Motion limits evaluation. No abnormality is seen. The patient is status post cataract surgery Other: None. CT CERVICAL SPINE FINDINGS Alignment: Trace anterolisthesis C3 on C4. Straightening of lordosis. Skull base and vertebrae: No acute fracture. No primary bone lesion or focal pathologic process. Soft tissues and spinal canal: No prevertebral fluid or swelling. No visible canal hematoma. Disc levels: Mild loss of disc space height and endplate spurring are seen at C5-6. Upper chest: Clear. Other: None. IMPRESSION: No acute abnormality head or cervical spine. Cortical atrophy and chronic microvascular ischemic change. Mild degenerative disc disease C5-6. Electronically Signed   By: Inge Rise M.D.   On: 07/17/2020 14:42   MR BRAIN WO CONTRAST  Result Date: 07/17/2020 CLINICAL DATA:  Unwitnessed fall, questionable left arm/grip weakness EXAM: MRI HEAD WITHOUT CONTRAST TECHNIQUE: Multiplanar, multiecho pulse sequences of the brain and surrounding structures were obtained without intravenous contrast. COMPARISON:  None. FINDINGS: Motion degraded DWI and sagittal T1 sequences were obtained. Patient could not tolerate remainder of the study. There is patchy diffusion hyperintensity in the right frontal, parietal, and temporal lobes. IMPRESSION: Two sequences performed with significant  motion degradation. Likely acute to subacute right MCA territory infarcts. Electronically Signed   By: Macy Mis M.D.   On: 07/17/2020 17:35    EKG: Personally reviewed.  Sinus rhythm with PACs, motion artifact. PACs new when compared to prior.  Assessment/Plan Principal Problem:   Acute ischemic right MCA stroke [facility]) Active Problems:   Acute lower UTI   CHELSIE BUREL is a 87 y.o. female with medical history significant for hyperlipidemia, SBO, leiomyosarcoma who is admitted with acute right MCA territory infarcts.  Acute to subacute right MCA territory infarcts: Seen on motion degraded MRI brain.  CTA head reveals nearly occlusive thrombus within proximal posterior division of the right MCA.  No significant stenosis on CTA neck.  Discussed with neurology, plan is for conservative management with DAPT at this time.  Initiate planned NG tube placement to administer antiplatelets, however she is tolerating oral meds now per nursing. -s/p aspirin  325 mg orally once, continue aspirin 81 mg daily -Continue Plavix 75 mg daily -Echocardiogram -Lipid panel, A1c -PT/OT/SLP eval -Monitor on telemetry, continue neurochecks -Allow permissive hypertension, treat SBP of >180  UTI: Patient reported dysuria and urinary frequency on arrival.  Continue empiric ceftriaxone.  Follow-up urine culture.  DVT prophylaxis: Lovenox Code Status: Full code per patient's son Family Communication: Discussed with patient's son at bedside Disposition Plan: From home, dispo pending PT/OT/SLP eval Consults called: Neurology Level of care: Telemetry Medical Admission status:  Status is: Observation  The patient remains OBS appropriate and will d/c before 2 midnights.  Dispo: The patient is from: Home              Anticipated d/c is to: SNF              Patient currently is not medically stable to d/c.   Zada Finders MD [facility]  If 7PM-7AM, please contact  night-coverage [email or web address]  07/18/2020, 12:50 AM

## 2020-07-17 NOTE — ED Provider Notes (Signed)
[facility] EMERGENCY DEPARTMENT Provider Note   CSN: [id number] Arrival date & time: 07/17/20  1157     History Chief Complaint  Patient presents with  . Fall  . Urinary Frequency    Monica Ward is a 87 y.o. female with history of chronic constipation, diverticulitis, SBO's, colectomy, appendectomy, hysterectomy presents to the ED for evaluation of unwitnessed fall.  History obtained from son who stays with her and directly from patient.  Son went in to check in on her at around 64 AM and found her laying on her bathroom floor.  She was mostly on her back with her legs curled up.  She had placed a bath towel behind her neck so he thinks she probably has been laying down for some time and was trying to comfort herself.  He was able to lift her up and walked her onto the bed.  She was able to put weight on her legs.  He noticed an abrasion in her left shin but has not noticed any other significant physical injury.  Son states that was slower to respond and seemed confused earlier this morning but now seems to be back to baseline.  She has been complaining of "gas pains" for the last 2 days and asked him to get gas medicine.  He noticed that she had "gas pills" on her nightstand.  He wonders if she was going to the bathroom to have a bowel movement.  Patient states that she remembers sliding off the toilet onto the ground.  She thinks she was trying to have a bowel movement but is not sure.  She reports lower abdominal pain.  She feels like she has to urinate but cannot.  Reports having "irritating" gas pains and points to her lower abdomen.  Feels like she cannot pass gas from below.  Her last BM was maybe 2 days ago.  She denies nausea, vomiting, chills.  Denies chest pain or shortness of breath.  Reports pain in both of her ankles but denies any other physical injury.  No headache or neck pain.  No interventions.  HPI     Past Medical History:  Diagnosis Date  .  Arthritis    "probably" (01/28/2012)  . Cancer ([place]) Around 2002-2003   Unclear history of "7 lb" abdominal tumor treated at [facility] ~2003 s/p open abdominal colonic resection and radiation  . Complication of anesthesia    "trouble waking up; too much med given" (01/28/2012)  . Constipation 02/03/2014  . Constipation, chronic 02/06/2014  . Diverticulitis    Per chart   . Diverticulosis 02/03/2014  . High cholesterol   . 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 Patient had surgery in 2000 and mass which was presumed to be a leiomyosarcoma rather than a spindle cell cancer. I have no further records of path reports to give me a good definite path diagnosis of her tumor resection.   . Precordial pain 02/12/2012  . SBO (small bowel obstruction) ([place]) 08/2009   Early, partial SBO improved with conservative Tx     Patient Active Problem List   Diagnosis Date Noted  . Acute ischemic right MCA stroke ([place]) 07/17/2020  . History of hip fracture 10/02/2016  . 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 History of small bowel obstruction 10/02/2016  . Age-related osteoporosis without current pathological fracture 10/02/2016  . Moderate malnutrition ([place]) 02/05/2015  . Thrombocytopenia ([place]) 02/02/2015  . High cholesterol   .  Constipation, chronic 02/06/2014  . Diverticulosis 02/03/2014  . B12 deficiency 02/03/2014    Past Surgical History:  Procedure Laterality Date  . ABDOMINAL HYSTERECTOMY  ~ 1985   ovaries resected B; DUB.  Mar[facility] Kitchen APPENDECTOMY  1968  . CATARACT EXTRACTION, BILATERAL  09/27/2015  . [place]  . COLECTOMY  2003   "cancer"  . TONSILLECTOMY     "when I was a child"  . TOTAL HIP ARTHROPLASTY Left 01/31/2015   Procedure:  ANTERIOR APPROACH  LEFT HEMI ARTHROPLASTY;  Surgeon: Rod Can, MD;  Location: [facility] ORS;  Service: Orthopedics;  Laterality: Left;  . TUMOR REMOVAL  1990's;  X 2   "benign"     OB History   No obstetric  history on file.     Family History  Problem Relation Age of Onset  . Heart failure Father 28       Heart failure  . Heart disease Father     Social History   Tobacco Use  . Smoking status: Never Smoker  . Smokeless tobacco: Never Used  Substance Use Topics  . Alcohol use: Yes    Alcohol/week: 3.0 standard drinks    Types: 3 Glasses of wine per week    Comment: social  . Drug use: No    Home Medications Prior to Admission medications   Medication Sig Start Date End Date Taking? Authorizing Provider  aspirin EC 81 MG tablet Take 81 mg by mouth daily.    [provider]  mupirocin ointment (BACTROBAN) 2 % Apply 1 application topically 3 (three) times daily. 04/26/20   Posey Boyer, MD    Allergies    Patient has no known allergies.  Review of Systems   Review of Systems  Gastrointestinal: Positive for abdominal pain.       Gas pains   Genitourinary: Positive for difficulty urinating.  Skin: Positive for wound.  Psychiatric/Behavioral: Positive for confusion.  All other systems reviewed and are negative.   Physical Exam Updated Vital Signs BP (!) 117/49   Pulse 62   Temp 97.6 F (36.4 C) (Oral)   Resp 13   SpO2 97%   Physical Exam Vitals and nursing note reviewed.  Constitutional:      Appearance: She is well-developed.     Comments: Frail, alert.  No acute distress.  Nontoxic.  Son at bedside.  HENT:     Head: Normocephalic and atraumatic.     Comments: No tenderness or signs of injury of the facial or skull bones.    Nose: Nose normal.     Mouth/Throat:     Mouth: Mucous membranes are dry.     Comments: Lips are dry.  Moist mucous membranes are tacky. Eyes:     Conjunctiva/sclera: Conjunctivae normal.  Neck:     Comments: No cervical collar in place.  No midline or paraspinal tenderness.  Full range of motion of the neck without any obvious discomfort. Cardiovascular:     Rate and Rhythm: Normal rate and regular rhythm.     Comments:  No lower extremity edema.  1+ radial and DP pulses bilaterally. Pulmonary:     Effort: Pulmonary effort is normal.     Breath sounds: Normal breath sounds.     Comments: Normal work of breathing.  Lungs clear.  No A.P.L. chest tenderness or signs of injury. Abdominal:     General: Bowel sounds are normal.     Palpations: Abdomen is soft.     Tenderness: There is  abdominal tenderness (Suprapubic). There is guarding (Suprapubic).     Comments: Flat.  No distention.  No bowel sounds auscultated.  Large ventral surgical scar noted.  Guarding, firmness, tenderness in the suprapubic area.  No CVA tenderness.  Negative Murphy's and McBurney's.  Genitourinary:    Comments: Perianal, buttock skin normal  Musculoskeletal:        General: Normal range of motion.     Cervical back: Normal range of motion.     Comments:  TL spine: No midline or paraspinal tenderness.  No bruising.  Pelvis: Left leg is longer than the right.  No pain with logroll bilaterally.  No pain with hip flexion, IR or ER.  No bony tenderness of hips bilaterally.  No A.P.L. instability with compression  Lower extremities: Patient spontaneously moving lower extremities and holding them up against gravity.  No reproducible bony tenderness of the knees or ankles however patient reporting pain in both ankles.  Skin:    General: Skin is warm and dry.     Capillary Refill: Capillary refill takes less than 2 seconds.     Comments:  1 in superficial  Linear abrasion across mid tib, hemostatic, minimally tender. No surrounding tibial/fibular bony tenderness   Neurological:     Mental Status: She is alert.     Motor: Weakness (left) present.     Comments:   Mental Status: Patient is awake, alert, oriented to hospital, full name and situation (somewhat). Recognizes her son. Speech is fluent and clear without dysarthria or aphasia.  No signs of neglect.  Cranial Nerves: I not tested II visual fields unable to be tested due to  patient cooperation. PERRL.   III, IV, VI EOMs intact without ptosis V sensation to light touch intact in all 3 divisions of trigeminal nerve bilaterally  VII facial movements symmetric bilaterally VIII hearing intact to voice/conversation  IX, X no uvula deviation, symmetric rise of soft palate/uvula XI 5/5 SCM and trapezius strength bilaterally  XII tongue protrusion midline, symmetric L/R movements  Motor: left hand grip weakness, push weakness, questionable left arm pronator drift (could be from contusions on forearm?). Left leg drift.  Strength 5/5 on the right.  Sensation to light touch intact in face, upper/lower extremities. No pronator drift. No leg drop.  Cerebellar: questionable ataxia with finger to nose with left  Psychiatric:        Behavior: Behavior normal.     ED Results / Procedures / Treatments   Labs (all labs ordered are listed, but only abnormal results are displayed) Labs Reviewed  URINALYSIS, ROUTINE W REFLEX MICROSCOPIC - Abnormal; Notable for the following components:      Result Value   Hgb urine dipstick SMALL (*)    Leukocytes,Ua SMALL (*)    Bacteria, UA MANY (*)    All other components within normal limits  URINE CULTURE  CBC WITH DIFFERENTIAL/PLATELET  COMPREHENSIVE METABOLIC PANEL  LIPASE, BLOOD  CK  HEMOGLOBIN A1C  LIPID PANEL    EKG EKG Interpretation  Date/Time:  Tuesday July 17 2020 12:07:43 EDT Ventricular Rate:  72 PR Interval:    QRS Duration: 94 QT Interval:  432 QTC Calculation: 446 R Axis:   -57 Text Interpretation: Sinus rhythm Atrial premature complexes Biatrial enlargement Left axis deviation Repol abnrm suggests ischemia, lateral leads Interpretation limited secondary to artifact Confirmed by Fredia Sorrow [id number]) on 07/17/2020 12:13:49 PM   Radiology DG Pelvis 1-2 Views  Result Date: 07/17/2020 CLINICAL DATA:  Fall. EXAM: PELVIS - 1-2 VIEW  COMPARISON:  January 31, 2015. FINDINGS: Status post left hip arthroplasty.  No fracture or dislocation is noted. Mild degenerative changes seen involving the right hip. IMPRESSION: No acute abnormality seen in the pelvis. Electronically Signed   By: Marijo Conception M.D.   On: 07/17/2020 13:58   DG Ankle Complete Left  Result Date: 07/17/2020 CLINICAL DATA:  Fall. EXAM: LEFT ANKLE COMPLETE - 3+ VIEW COMPARISON:  None. FINDINGS: There is no evidence of fracture, dislocation, or joint effusion. There is no evidence of arthropathy or other focal bone abnormality. Soft tissues are unremarkable. IMPRESSION: Negative. Electronically Signed   By: Marijo Conception M.D.   On: 07/17/2020 14:01   DG Ankle Complete Right  Result Date: 07/17/2020 CLINICAL DATA:  Fall. EXAM: RIGHT ANKLE - COMPLETE 3+ VIEW COMPARISON:  None. FINDINGS: There is no evidence of fracture, dislocation, or joint effusion. There is no evidence of arthropathy or other focal bone abnormality. Soft tissues are unremarkable. IMPRESSION: Negative. Electronically Signed   By: Marijo Conception M.D.   On: 07/17/2020 14:01   DG Abdomen 1 View  Result Date: 07/17/2020 CLINICAL DATA:  Fall. EXAM: ABDOMEN - 1 VIEW COMPARISON:  February 04, 2014. FINDINGS: The bowel gas pattern is normal. No radio-opaque calculi or other significant radiographic abnormality are seen. Surgical clips are seen in the pelvis. IMPRESSION: No evidence of bowel obstruction or ileus. Aortic Atherosclerosis (ICD10-I70.0). Electronically Signed   By: Marijo Conception M.D.   On: 07/17/2020 14:00   CT Head Wo Contrast  Result Date: 07/17/2020 CLINICAL DATA:  Status post fall.  Initial encounter. EXAM: CT HEAD WITHOUT CONTRAST CT CERVICAL SPINE WITHOUT CONTRAST TECHNIQUE: Multidetector CT imaging of the head and cervical spine was performed following the standard protocol without intravenous contrast. Multiplanar CT image reconstructions of the cervical spine were also generated. COMPARISON:  None. FINDINGS: CT HEAD FINDINGS Brain: No evidence of acute  infarction, hemorrhage, hydrocephalus, extra-axial collection or mass lesion/mass effect. Atrophy and chronic microvascular ischemic change noted. Vascular: No hyperdense vessel or unexpected calcification. Skull: No fracture or focal lesion. Sinuses/Orbits: Motion limits evaluation. No abnormality is seen. The patient is status post cataract surgery Other: None. CT CERVICAL SPINE FINDINGS Alignment: Trace anterolisthesis C3 on C4. Straightening of lordosis. Skull base and vertebrae: No acute fracture. No primary bone lesion or focal pathologic process. Soft tissues and spinal canal: No prevertebral fluid or swelling. No visible canal hematoma. Disc levels: Mild loss of disc space height and endplate spurring are seen at C5-6. Upper chest: Clear. Other: None. IMPRESSION: No acute abnormality head or cervical spine. Cortical atrophy and chronic microvascular ischemic change. Mild degenerative disc disease C5-6. Electronically Signed   By: Inge Rise M.D.   On: 07/17/2020 14:42   CT Cervical Spine Wo Contrast  Result Date: 07/17/2020 CLINICAL DATA:  Status post fall.  Initial encounter. EXAM: CT HEAD WITHOUT CONTRAST CT CERVICAL SPINE WITHOUT CONTRAST TECHNIQUE: Multidetector CT imaging of the head and cervical spine was performed following the standard protocol without intravenous contrast. Multiplanar CT image reconstructions of the cervical spine were also generated. COMPARISON:  None. FINDINGS: CT HEAD FINDINGS Brain: No evidence of acute infarction, hemorrhage, hydrocephalus, extra-axial collection or mass lesion/mass effect. Atrophy and chronic microvascular ischemic change noted. Vascular: No hyperdense vessel or unexpected calcification. Skull: No fracture or focal lesion. Sinuses/Orbits: Motion limits evaluation. No abnormality is seen. The patient is status post cataract surgery Other: None. CT CERVICAL SPINE FINDINGS Alignment: Trace anterolisthesis C3 on C4. Straightening  of lordosis. Skull base  and vertebrae: No acute fracture. No primary bone lesion or focal pathologic process. Soft tissues and spinal canal: No prevertebral fluid or swelling. No visible canal hematoma. Disc levels: Mild loss of disc space height and endplate spurring are seen at C5-6. Upper chest: Clear. Other: None. IMPRESSION: No acute abnormality head or cervical spine. Cortical atrophy and chronic microvascular ischemic change. Mild degenerative disc disease C5-6. Electronically Signed   By: Inge Rise M.D.   On: 07/17/2020 14:42   MR BRAIN WO CONTRAST  Result Date: 07/17/2020 CLINICAL DATA:  Unwitnessed fall, questionable left arm/grip weakness EXAM: MRI HEAD WITHOUT CONTRAST TECHNIQUE: Multiplanar, multiecho pulse sequences of the brain and surrounding structures were obtained without intravenous contrast. COMPARISON:  None. FINDINGS: Motion degraded DWI and sagittal T1 sequences were obtained. Patient could not tolerate remainder of the study. There is patchy diffusion hyperintensity in the right frontal, parietal, and temporal lobes. IMPRESSION: Two sequences performed with significant motion degradation. Likely acute to subacute right MCA territory infarcts. Electronically Signed   By: Macy Mis M.D.   On: 07/17/2020 17:35    Procedures .Critical Care Performed by: Kinnie Feil, PA-C Authorized by: Kinnie Feil, PA-C   Critical care provider statement:    Critical care time (minutes):  45   Critical care was necessary to treat or prevent imminent or life-threatening deterioration of the following conditions:  CNS failure or compromise (stroke)   Critical care was time spent personally by me on the following activities:  Discussions with consultants, evaluation of patient's response to treatment, examination of patient, ordering and performing treatments and interventions, ordering and review of laboratory studies, ordering and review of radiographic studies, pulse oximetry, re-evaluation of  patient's condition, obtaining history from patient or surrogate, review of old charts and development of treatment plan with patient or surrogate   I assumed direction of critical care for this patient from another provider in my specialty: no       Medications Ordered in ED Medications  lactated ringers infusion ( Intravenous New Bag/Given 07/17/20 1351)  bacitracin ointment (2 application Topical Given 07/17/20 1837)  aspirin suppository 300 mg (has no administration in time range)  aspirin chewable tablet 81 mg (has no administration in time range)  metoCLOPramide (REGLAN) injection 10 mg (10 mg Intravenous Given 07/17/20 1549)  morphine 2 MG/ML injection 2 mg (2 mg Intravenous Given 07/17/20 1550)  cefTRIAXone (ROCEPHIN) 1 g in sodium chloride 0.9 % 100 mL IVPB (0 g Intravenous Stopped 07/17/20 1931)  LORazepam (ATIVAN) injection 1 mg (1 mg Intravenous Given 07/17/20 1702)    ED Course  I have reviewed the triage vital signs and the nursing notes.  Pertinent labs & imaging results that were available during my care of the patient were reviewed by me and considered in my medical decision making (see chart for details).  Clinical Course as of 07/17/20 2040  Tue Jul 17, 2020  1508 Bacteria, UA(!): MANY [CG]  1508 WBC, UA: 0-5 [CG]  1508 Chalmers Guest): SMALL [CG]  1508 CT Head Wo Contrast [CG]  1508 CT Cervical Spine Wo Contrast IMPRESSION: No acute abnormality head or cervical spine.  Cortical atrophy and chronic microvascular ischemic change.  Mild degenerative disc disease C5-6. [CG]  1509 DG Ankle Complete Right Negative  [CG]  1509 DG Ankle Complete Left Negative  [CG]  1509 DG Abdomen 1 View IMPRESSION: No evidence of bowel obstruction or ileus.  Aortic Atherosclerosis (ICD10-I70.0). [CG]  1509 DG Pelvis 1-2  Views FINDINGS: Status post left hip arthroplasty. No fracture or dislocation is noted. Mild degenerative changes seen involving the right hip. [CG]  1509  WBC: 8.4 [CG]  1509 Hemoglobin: 14.9 [CG]  1509 Creatinine: 0.73 [CG]  1520 Re-evaluated patient - reports starting to get headache. Reports "gas". Repeat exam she has left hand grip weakness, pronator drift. Questionable left leg drift. Bruising on left forearm. Will give reglan and tylenol for headache, plan for rectal exam, possible MRI given weakness.  [CG]  1741 MR BRAIN WO CONTRAST IMPRESSION: Two sequences performed with significant motion degradation. Likely acute to subacute right MCA territory infarcts. [CG]  2039 Right MCA nearly occlusive in M2 segment per Dr Posey Pronto Radiologist  [CG]    Clinical Course User Index [CG] Arlean Hopping   MDM Rules/Calculators/A&P                          87 year old female brought to the ED after an unwitnessed fall.  She reports bilateral ankle pain, "gas pains" and has suprapubic tenderness and guarding.  History of several abdominal surgeries, SBO's. Confusion per son. LKN 930 pm last night when son put her in bed.  Additional information obtained from chart review, triage nursing notes. Obtained additional information from patient's son at bedside reports patient seems confused and slower to respond earlier this morning when he found on the ground.  Chart review reveals patient has had multiple abdominal surgeries, SBO's.  Has been seen for abdominal pain and constipation that appears to be chronic.  ER lab work, imaging ordered by me as above  ER lab work, imaging personally visualized and interpreted by me  Labs reveal -  Vastly reassuring lab work. Normal WBC, hemoglobin, electrolytes and creatinine. UA with many bacteria, small leukocytes.  She has suprapubic tenderness but given confusion difficulty to determine if symptomatic, will err on side of caution and treat for UTI pending culture. CK normal.   Imaging - no traumatic injuries on CT/x-rays. MRI shows acute/subacute right MCA infarcts.   Medicines ordered - rocephin,  ativan for MRI, morphine/reglan for reported headache, LR infusion  Patient re-evaluated, serial exams. Has become more confused. Updated son. Will request admission, consult neurology.   Consults in ER : neurology Dr Cheral Marker, hospitalist Dr Posey Pronto who will admit.   Discussed with EDP Zackowski.  Final Clinical Impression(s) / ED Diagnoses Final diagnoses:  Fall, initial encounter  Acute right MCA stroke [facility])    Rx / DC Orders ED Discharge Orders    None       Arlean Hopping 07/17/20 2015    Fredia Sorrow, MD 07/21/20 [phone]

## 2020-07-17 NOTE — Consult Note (Signed)
NEURO HOSPITALIST CONSULT NOTE   Requestig physician: Dr. Rogene Houston  Reason for Consult: Acute onset of confusion and agitation   History obtained from:  Chart and Patient's son at bedside  HPI:                                                                                                                                          NYAISHA SIMAO is an 87 y.o. female with a PMHx of abdominal tumor s/p open abdominal colonic resection and radiation in 2003, diverticulitis, hypercholesterolemia, leiomyosarcoma and SBO, presenting from home via EMS with agitation and confusion after being found on the floor by her son at 30 AM today. There was bruising to her left forearm and left lower leg with a skin tear on the left shin. The son does not know when the patient fell, but believes that it was some time in the early morning. LKN was 9:30 PM yesterday when she was going to bed. He states when he was assisting her to stand this morning, she was confused with left sided weakness and slurred speech. On arrival to the ED the patient was grabbing at vaginal area stating it hurts and also had frequent urination. She was confused and "pulling/grabbing at everything". A UTI is suspected by the ED physician.   Exam by EDP revealed left hand weakness and LUE drift.    CT of head showed no acute abnormality. Cortical atrophy and chronic microvascular ischemic change was noted.  CT of cervical spine showed no acute abnormality. Mild degenerative disc disease C5-6 was noted.   MRI brain was subsequently obtained, revealing a medium sized region of patchy DWI signal abnormality in the right MCA territory appearing most consistent with an acute stroke.   Past Medical History:  Diagnosis Date  . Arthritis    "probably" (01/28/2012)  . Cancer ([place]) Around 2002-2003   Unclear history of "7 lb" abdominal tumor treated at [facility] ~2003 s/p open abdominal colonic resection and radiation  .  Complication of anesthesia    "trouble waking up; too much med given" (01/28/2012)  . Constipation 02/03/2014  . Constipation, chronic 02/06/2014  . Diverticulitis    Per chart   . Diverticulosis 02/03/2014  . High cholesterol   . 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 Patient had surgery in 2000 and mass which was presumed to be a leiomyosarcoma rather than a spindle cell cancer. I have no further records of path reports to give me a good definite path diagnosis of her tumor resection.   . Precordial pain 02/12/2012  . SBO (small bowel obstruction) ([place]) 08/2009   Early, partial SBO improved with conservative Tx    Past Surgical History:  Procedure Laterality Date  .  ABDOMINAL HYSTERECTOMY  ~ 1985   ovaries resected B; DUB.  [facility] APPENDECTOMY  1968  . CATARACT EXTRACTION, BILATERAL  09/27/2015  . [place]  . COLECTOMY  2003   "cancer"  . TONSILLECTOMY     "when I was a child"  . TOTAL HIP ARTHROPLASTY Left 01/31/2015   Procedure:  ANTERIOR APPROACH  LEFT HEMI ARTHROPLASTY;  Surgeon: Rod Can, MD;  Location: [facility]S;  Service: Orthopedics;  Laterality: Left;  . TUMOR REMOVAL  1990's;  X 2   "benign"   Family History  Problem Relation Age of Onset  . Heart failure Father 35       Heart failure  . Heart disease Father              Social History:  reports that she has never smoked. She has never used smokeless tobacco. She reports current alcohol use of about 3.0 standard drinks of alcohol per week. She reports that she does not use drugs.  No Known Allergies  MEDICATIONS:                                                                                                                     No current facility-administered medications on file prior to encounter.   Current Outpatient Medications on File Prior to Encounter  Medication Sig Dispense Refill  . aspirin EC 81 MG tablet Take 81 mg by mouth daily.    . mupirocin  ointment (BACTROBAN) 2 % Apply 1 application topically 3 (three) times daily. 22 g 1    ROS:                                                                                                                                       Unable to be assessed due to altered mental status s/p Ativan for agitation  Blood pressure (!) 145/46, pulse (!) 43, temperature (!) 97.5 F (36.4 C), resp. rate 13, SpO2 96 %.  General Examination:  Physical Exam  HEENT-  Normocephalic, no lesions, without obvious abnormality.  Normal external eye and conjunctiva.   Cardiovascular: extremities warm, without edema, pulses palpable throughout   Lungs: no excessive working breathing, regular respirations Abdomen: Abdomen soft, non-distended Extremities: Warm, without obvious deformity, skin tear over left shin Musculoskeletal: no joint tenderness, deformity or swelling Skin: scattered ecchymosis over left upper and lower extremities with skin tear on left shin without active hemorrhage  Neurological Examination Mental Status: Patient obtunded, unable to provide any history of present illness.  She does not follow commands.  She does have some intermittent spontaneous movements of the right upper extremity. Attempts to open eyes to loud voice and tactile stimulation inconsistently.  Attempts to answer name once when asked and made the "K" sound but was unable to verbalize name or other orientation questions. Patient moaned slightly with application of noxious stimuli but did not provide further verbalization.  Cranial Nerves: II: Pupils 2 mm, equal, round, and briskly reactive bilaterally  III,IV, VI: Gaze is midline without fixation or tracking noted on examination V,VII: Facial movement difficult to assess due to patient positioning and without significant movement with grimace, appears symmetric resting.  VIII:  Unable to assess due to patient mental status IX,X: Unable to assess throat or vocalization due to patient mental status XI, XII: UTA, patient does not follow commands. Patient head turned towards the right.  Motor: Right : Upper extremity   4/5    Left:     Upper extremity   0/5  Lower extremity   2/5     Lower extremity   1/5 Right upper extremity with purposeful and spontaneous antigravity movement without pronator drift, visualized patient holding right arm up in the air for 10 seconds spontaneously but does not hold antigravity movement to command. Left upper extremity flaccid on examination without movement to noxious stimuli and with immediate drift to bed. Left lower extremity with minimal movement to noxious stimuli and right lower extremity with withdrawal to noxious stimuli. Does not attempt antigravity movement in bilateral lower extremities to command.  Tone normal, bulk decreased throughout.  Sensory: Grimaces more briskly with application of noxious stimuli to the right face, right upper extremity, and right lower extremity compared. Requires application of more stimuli for grimace on the left side throughout.  Deep Tendon Reflexes: 2+ in bilateral biceps, 3+ and symmetric in bilateral patellae Plantars: Right: upgoing   Left: mute Cerebellar: Unable to assess due to patient mental status Gait: deferred   Lab Results: Basic Metabolic Panel: Recent Labs  Lab 07/17/20 1222  NA 141  K 3.6  CL 107  CO2 26  GLUCOSE 97  BUN 21  CREATININE 0.73  CALCIUM 9.5   CBC: Recent Labs  Lab 07/17/20 1222  WBC 8.4  NEUTROABS 6.8  HGB 14.9  HCT 44.1  MCV 92.6  PLT 319   Cardiac Enzymes: Recent Labs  Lab 07/17/20 1222  CKTOTAL 81   Lipid Panel: No results for input(s): CHOL, TRIG, HDL, CHOLHDL, VLDL, LDLCALC in the last 168 hours.  Imaging: DG Pelvis 1-2 Views  Result Date: 07/17/2020 CLINICAL DATA:  Fall. EXAM: PELVIS - 1-2 VIEW COMPARISON:  January 31, 2015. FINDINGS:  Status post left hip arthroplasty. No fracture or dislocation is noted. Mild degenerative changes seen involving the right hip. IMPRESSION: No acute abnormality seen in the pelvis. Electronically Signed   By: Marijo Conception M.D.   On: 07/17/2020 13:58   DG Ankle Complete Left  Result Date:  07/17/2020 CLINICAL DATA:  Fall. EXAM: LEFT ANKLE COMPLETE - 3+ VIEW COMPARISON:  None. FINDINGS: There is no evidence of fracture, dislocation, or joint effusion. There is no evidence of arthropathy or other focal bone abnormality. Soft tissues are unremarkable. IMPRESSION: Negative. Electronically Signed   By: Marijo Conception M.D.   On: 07/17/2020 14:01   DG Ankle Complete Right  Result Date: 07/17/2020 CLINICAL DATA:  Fall. EXAM: RIGHT ANKLE - COMPLETE 3+ VIEW COMPARISON:  None. FINDINGS: There is no evidence of fracture, dislocation, or joint effusion. There is no evidence of arthropathy or other focal bone abnormality. Soft tissues are unremarkable. IMPRESSION: Negative. Electronically Signed   By: Marijo Conception M.D.   On: 07/17/2020 14:01   DG Abdomen 1 View  Result Date: 07/17/2020 CLINICAL DATA:  Fall. EXAM: ABDOMEN - 1 VIEW COMPARISON:  February 04, 2014. FINDINGS: The bowel gas pattern is normal. No radio-opaque calculi or other significant radiographic abnormality are seen. Surgical clips are seen in the pelvis. IMPRESSION: No evidence of bowel obstruction or ileus. Aortic Atherosclerosis (ICD10-I70.0). Electronically Signed   By: Marijo Conception M.D.   On: 07/17/2020 14:00   CT Head Wo Contrast  Result Date: 07/17/2020 CLINICAL DATA:  Status post fall.  Initial encounter. EXAM: CT HEAD WITHOUT CONTRAST CT CERVICAL SPINE WITHOUT CONTRAST TECHNIQUE: Multidetector CT imaging of the head and cervical spine was performed following the standard protocol without intravenous contrast. Multiplanar CT image reconstructions of the cervical spine were also generated. COMPARISON:  None. FINDINGS: CT HEAD FINDINGS  Brain: No evidence of acute infarction, hemorrhage, hydrocephalus, extra-axial collection or mass lesion/mass effect. Atrophy and chronic microvascular ischemic change noted. Vascular: No hyperdense vessel or unexpected calcification. Skull: No fracture or focal lesion. Sinuses/Orbits: Motion limits evaluation. No abnormality is seen. The patient is status post cataract surgery Other: None. CT CERVICAL SPINE FINDINGS Alignment: Trace anterolisthesis C3 on C4. Straightening of lordosis. Skull base and vertebrae: No acute fracture. No primary bone lesion or focal pathologic process. Soft tissues and spinal canal: No prevertebral fluid or swelling. No visible canal hematoma. Disc levels: Mild loss of disc space height and endplate spurring are seen at C5-6. Upper chest: Clear. Other: None. IMPRESSION: No acute abnormality head or cervical spine. Cortical atrophy and chronic microvascular ischemic change. Mild degenerative disc disease C5-6. Electronically Signed   By: Inge Rise M.D.   On: 07/17/2020 14:42   CT Cervical Spine Wo Contrast  Result Date: 07/17/2020 CLINICAL DATA:  Status post fall.  Initial encounter. EXAM: CT HEAD WITHOUT CONTRAST CT CERVICAL SPINE WITHOUT CONTRAST TECHNIQUE: Multidetector CT imaging of the head and cervical spine was performed following the standard protocol without intravenous contrast. Multiplanar CT image reconstructions of the cervical spine were also generated. COMPARISON:  None. FINDINGS: CT HEAD FINDINGS Brain: No evidence of acute infarction, hemorrhage, hydrocephalus, extra-axial collection or mass lesion/mass effect. Atrophy and chronic microvascular ischemic change noted. Vascular: No hyperdense vessel or unexpected calcification. Skull: No fracture or focal lesion. Sinuses/Orbits: Motion limits evaluation. No abnormality is seen. The patient is status post cataract surgery Other: None. CT CERVICAL SPINE FINDINGS Alignment: Trace anterolisthesis C3 on C4.  Straightening of lordosis. Skull base and vertebrae: No acute fracture. No primary bone lesion or focal pathologic process. Soft tissues and spinal canal: No prevertebral fluid or swelling. No visible canal hematoma. Disc levels: Mild loss of disc space height and endplate spurring are seen at C5-6. Upper chest: Clear. Other: None. IMPRESSION: No acute abnormality head or  cervical spine. Cortical atrophy and chronic microvascular ischemic change. Mild degenerative disc disease C5-6. Electronically Signed   By: Inge Rise M.D.   On: 07/17/2020 14:42   MR BRAIN WO CONTRAST  Result Date: 07/17/2020 CLINICAL DATA:  Unwitnessed fall, questionable left arm/grip weakness EXAM: MRI HEAD WITHOUT CONTRAST TECHNIQUE: Multiplanar, multiecho pulse sequences of the brain and surrounding structures were obtained without intravenous contrast. COMPARISON:  None. FINDINGS: Motion degraded DWI and sagittal T1 sequences were obtained. Patient could not tolerate remainder of the study. There is patchy diffusion hyperintensity in the right frontal, parietal, and temporal lobes. IMPRESSION: Two sequences performed with significant motion degradation. Likely acute to subacute right MCA territory infarcts. Electronically Signed   By: Macy Mis M.D.   On: 07/17/2020 17:35    Assessment: 87 year old female presenting with acute onset of agitation and confusion after unwitnessed fall with left-sided weakness.  1. Exam limited due to Ativan administration for imaging. Initial examination reveals left upper extremity flaccid, obtunded patient, left lower extremity with minimal motor response, and minimal vocalization.  2. MRI brain: Motion degraded DWI and sagittal T1 sequences were obtained. Patient could not tolerate remainder of the study. There is patchy diffusion hyperintensity in the right frontal, parietal, and temporal lobes, most likely representing acute to subacute right MCA territory infarcts. 3. Out of the tPA  time window 4. Exam by EDP prior to sedation for MRI revealed patient to be awake, alert, oriented to hospital and her name with fluent speech, no dysarthria, intact FT to face, symmetric facial movements, midline tongue, left hand weakness and LUE push weakness with equivocal pronator drift; left leg drift and questionable ataxia with left FNF. Neurological exam after sedation not reliable for purposes of correlating with stroke severity but does localize to the right hemisphere.  5. Based on EDP exam prior to sedation, the patient's deficits are not consistent with LVO. MRI findings and duration of symptoms are most consistent with a completed right MCA ischemic stroke with occurrence prior to 9 AM. Risks of reperfusion injury in the setting of a frail elderly patient with increased bleeding risk outweighs potential benefits. Thrombectomy is not indicated.    Recommendations: - HgbA1c, fasting lipid panel; goal LDL <70, initiate high intensity statin if LDL > 70.  - CT angio head and neck for vessel imaging - Frequent neuro checks - Echocardiogram - Prophylactic therapy- Antiplatelet med: Aspirin - dose 325mg  PO or 300mg  PR now followed by 81 mg PO daily  - Risk factor modification - Telemetry monitoring - PT consult, OT consult, Speech consult - Modified permissive HTN protocol given advanced age. Treat SBP if > 180.  - IV hydration  Addendum: - CTA of head reveals nearly occlusive thrombus within the proximal posterior division of the right MCA. Diminished flow within more distal vessels. Moderate stenosis proximal right P2 PCA is also noted. - CTA of neck reveals no significant stenosis. - Will add Plavix via NGT, first dose now.   Electronically signed: Dr. Kerney Elbe 07/17/2020, 5:53 PM

## 2020-07-17 NOTE — Progress Notes (Signed)
Pt would not follow command to hold still, Pt tried pulling off head coil, drawing up legs, pulling at the lights in the machine. RN came over gave meds, pt calmed down until machine started making noise. Pt began moving all over the place again. Unable to obtain diagnostic images at this time.

## 2020-07-17 NOTE — ED Notes (Signed)
Patient transported to MRI 

## 2020-07-17 NOTE — ED Triage Notes (Signed)
Pt arrived to ED via EMS from home. Pt was found on the floor by her son this am. Son does not know when she, but believes some time early morning, Pt with bruising to left forearm, left lower leg with skin tear. Pt is grabbing at vaginal area stating it hurts and frequent urination. Pt is confused and pulling/ grabbing at everything. Son states this is not her baseline

## 2020-07-17 NOTE — ED Notes (Signed)
Patient transported to X-ray 

## 2020-07-18 ENCOUNTER — Inpatient Hospital Stay (HOSPITAL_COMMUNITY): Payer: Medicare Other

## 2020-07-18 DIAGNOSIS — Z923 Personal history of irradiation: Secondary | ICD-10-CM | POA: Diagnosis not present

## 2020-07-18 DIAGNOSIS — Z8719 Personal history of other diseases of the digestive system: Secondary | ICD-10-CM | POA: Diagnosis not present

## 2020-07-18 DIAGNOSIS — Z90710 Acquired absence of both cervix and uterus: Secondary | ICD-10-CM | POA: Diagnosis not present

## 2020-07-18 DIAGNOSIS — I639 Cerebral infarction, unspecified: Secondary | ICD-10-CM | POA: Diagnosis present

## 2020-07-18 DIAGNOSIS — I6529 Occlusion and stenosis of unspecified carotid artery: Secondary | ICD-10-CM | POA: Diagnosis present

## 2020-07-18 DIAGNOSIS — S5012XA Contusion of left forearm, initial encounter: Secondary | ICD-10-CM | POA: Diagnosis present

## 2020-07-18 DIAGNOSIS — N390 Urinary tract infection, site not specified: Secondary | ICD-10-CM | POA: Diagnosis present

## 2020-07-18 DIAGNOSIS — I480 Paroxysmal atrial fibrillation: Secondary | ICD-10-CM | POA: Diagnosis not present

## 2020-07-18 DIAGNOSIS — S81812A Laceration without foreign body, left lower leg, initial encounter: Secondary | ICD-10-CM | POA: Diagnosis present

## 2020-07-18 DIAGNOSIS — R451 Restlessness and agitation: Secondary | ICD-10-CM | POA: Diagnosis present

## 2020-07-18 DIAGNOSIS — Z7982 Long term (current) use of aspirin: Secondary | ICD-10-CM | POA: Diagnosis not present

## 2020-07-18 DIAGNOSIS — R54 Age-related physical debility: Secondary | ICD-10-CM | POA: Diagnosis present

## 2020-07-18 DIAGNOSIS — I5032 Chronic diastolic (congestive) heart failure: Secondary | ICD-10-CM | POA: Diagnosis present

## 2020-07-18 DIAGNOSIS — R471 Dysarthria and anarthria: Secondary | ICD-10-CM | POA: Diagnosis present

## 2020-07-18 DIAGNOSIS — Z9049 Acquired absence of other specified parts of digestive tract: Secondary | ICD-10-CM | POA: Diagnosis not present

## 2020-07-18 DIAGNOSIS — I6389 Other cerebral infarction: Secondary | ICD-10-CM

## 2020-07-18 DIAGNOSIS — I63411 Cerebral infarction due to embolism of right middle cerebral artery: Secondary | ICD-10-CM | POA: Diagnosis present

## 2020-07-18 DIAGNOSIS — Y92012 Bathroom of single-family (private) house as the place of occurrence of the external cause: Secondary | ICD-10-CM | POA: Diagnosis not present

## 2020-07-18 DIAGNOSIS — I2510 Atherosclerotic heart disease of native coronary artery without angina pectoris: Secondary | ICD-10-CM | POA: Diagnosis present

## 2020-07-18 DIAGNOSIS — Z20822 Contact with and (suspected) exposure to covid-19: Secondary | ICD-10-CM | POA: Diagnosis present

## 2020-07-18 DIAGNOSIS — E7800 Pure hypercholesterolemia, unspecified: Secondary | ICD-10-CM | POA: Diagnosis present

## 2020-07-18 DIAGNOSIS — Z96642 Presence of left artificial hip joint: Secondary | ICD-10-CM | POA: Diagnosis present

## 2020-07-18 DIAGNOSIS — Z8589 Personal history of malignant neoplasm of other organs and systems: Secondary | ICD-10-CM | POA: Diagnosis not present

## 2020-07-18 DIAGNOSIS — I110 Hypertensive heart disease with heart failure: Secondary | ICD-10-CM | POA: Diagnosis present

## 2020-07-18 DIAGNOSIS — Z8249 Family history of ischemic heart disease and other diseases of the circulatory system: Secondary | ICD-10-CM | POA: Diagnosis not present

## 2020-07-18 DIAGNOSIS — S80812A Abrasion, left lower leg, initial encounter: Secondary | ICD-10-CM | POA: Diagnosis present

## 2020-07-18 DIAGNOSIS — W19XXXA Unspecified fall, initial encounter: Secondary | ICD-10-CM | POA: Diagnosis present

## 2020-07-18 DIAGNOSIS — I63511 Cerebral infarction due to unspecified occlusion or stenosis of right middle cerebral artery: Secondary | ICD-10-CM | POA: Diagnosis present

## 2020-07-18 DIAGNOSIS — E785 Hyperlipidemia, unspecified: Secondary | ICD-10-CM | POA: Diagnosis present

## 2020-07-18 LAB — ECHOCARDIOGRAM COMPLETE
Area-P 1/2: 2.16 cm2
Height: 64 in
S' Lateral: 3.1 cm
Weight: 1742.52 oz

## 2020-07-18 MED ORDER — ASPIRIN EC 325 MG PO TBEC
325.0000 mg | DELAYED_RELEASE_TABLET | Freq: Every day | ORAL | Status: DC
  Administered 2020-07-19: 325 mg via ORAL
  Filled 2020-07-18: qty 1

## 2020-07-18 MED ORDER — ASPIRIN EC 325 MG PO TBEC: 325.0000 mg | DELAYED_RELEASE_TABLET | Freq: Every day | ORAL | Status: DC

## 2020-07-18 MED ORDER — ATORVASTATIN CALCIUM 40 MG PO TABS: 40.0000 mg | ORAL_TABLET | Freq: Every day | ORAL | Status: DC

## 2020-07-18 MED ORDER — ATORVASTATIN CALCIUM 80 MG PO TABS
80.0000 mg | ORAL_TABLET | Freq: Every day | ORAL | Status: DC
Start: 2020-07-18 — End: 2020-07-20
  Administered 2020-07-18 – 2020-07-20 (×3): 80 mg via ORAL
  Filled 2020-07-18 (×3): qty 1

## 2020-07-18 MED ORDER — LABETALOL HCL 5 MG/ML IV SOLN: 5.0000 mg | INTRAVENOUS | Status: DC | PRN

## 2020-07-18 MED ORDER — SODIUM CHLORIDE 0.9 % IV SOLN
1.0000 g | INTRAVENOUS | Status: DC
  Administered 2020-07-18: 1 g via INTRAVENOUS
  Filled 2020-07-18: qty 10

## 2020-07-18 NOTE — Progress Notes (Signed)
Lower extremity venous bilateral study completed.   Please see CV Proc for preliminary results.   Cherre Kothari, RDMS, RVT  

## 2020-07-18 NOTE — TOC Initial Note (Signed)
Transition of Care [facility]) - Initial/Assessment Note    Patient Details  Name: Monica Ward MRN: [id number] Date of Birth: 1930/01/03  Transition of Care [facility]) CM/SW Contact:    Marney Setting, [place] Work Phone Number: 07/18/2020, 4:03 PM  Clinical Narrative:  MSW spoke with patient's daughter Opal Sidles and son Shanon Brow about PT/OT recommending SNF. They are in agreement and want to send her info out to [place] and [place] areas. Waiting for bed options, will follow up.                  Expected Discharge Plan: Skilled Nursing Facility Barriers to Discharge: Continued Medical Work up,Insurance Authorization   Patient Goals and CMS Choice Patient states their goals for this hospitalization and ongoing recovery are:: Unable to assess CMS Medicare.gov Compare Post Acute Care list provided to:: Patient Represenative (must comment) Choice offered to / list presented to : Adult Children  Expected Discharge Plan and Services Expected Discharge Plan: [place] Acute Care Choice: [place] arrangements for the past 2 months: Single Family Home                                      Prior Living Arrangements/Services Living arrangements for the past 2 months: Single Family Home Lives with:: Adult Children Patient language and need for interpreter reviewed:: Yes Do you feel safe going back to the place where you live?: Yes      Need for Family Participation in Patient Care: Yes (Comment) Care giver support system in place?: No (comment)   Criminal Activity/Legal Involvement Pertinent to Current Situation/Hospitalization: No - Comment as needed  Activities of Daily Living      Permission Sought/Granted Permission sought to share information with : Facility Retail banker granted to share information with : Yes, Verbal Permission Granted  Share Information with NAME:  Shanon Brow  Permission granted to share info w AGENCY: SNF  Permission granted to share info w Relationship: Brother     Emotional Assessment Appearance:: Appears stated age Attitude/Demeanor/Rapport: Unable to Assess Affect (typically observed): Unable to Assess Orientation: : Oriented to Self Alcohol / Substance Use: Not Applicable Psych Involvement: No (comment)  Admission diagnosis:  Acute ischemic right MCA stroke [facility]) [I63.511] Acute right MCA stroke ([place]) [I63.511] Fall, initial encounter [W19.XXXA] CVA (cerebral vascular accident) [facility]) [I63.9] Patient Active Problem List   Diagnosis Date Noted  . Acute lower UTI 07/18/2020  . CVA (cerebral vascular accident) ([place]) 07/18/2020  . Acute ischemic right MCA stroke ([place]) 07/17/2020  . History of hip fracture 10/02/2016  . 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 History of small bowel obstruction 10/02/2016  . Age-related osteoporosis without current pathological fracture 10/02/2016  . Moderate malnutrition ([place]) 02/05/2015  . Thrombocytopenia ([place]) 02/02/2015  . High cholesterol   . Constipation, chronic 02/06/2014  . Diverticulosis 02/03/2014  . B12 deficiency 02/03/2014   PCP:  Horald Pollen, MD Pharmacy:   [id number] - [place], [place] [address] Phone: [phone] Fax: [phone]     Social Determinants of Health (SDOH) Interventions    Readmission Risk Interventions No flowsheet data found.

## 2020-07-18 NOTE — Progress Notes (Addendum)
STROKE TEAM PROGRESS NOTE   INTERVAL HISTORY Her son and daughter are at the bedside.  The son reports that he lives with her and found her yesterday morning. He thinks that at some point she got up to go to the bathroom. Then while in there she feel and hit the tube given the bruising pattern she had on her left leg and arm. This makes it most likely that her stroke happened first then her fall. Will have loop recorder placed.  Hospitalist service is Primary.  Vitals:   07/17/20 2231 07/17/20 2356 07/18/20 0334 07/18/20 0738  BP: (!) 178/80 (!) 155/70 (!) 155/68 (!) 152/73  Pulse: 66 62 73 (!) 58  Resp: 18 18 18 17   Temp: (!) 97.5 F (36.4 C) 98.6 F (37 C) 97.7 F (36.5 C) 98.3 F (36.8 C)  TempSrc: Oral Oral Oral Oral  SpO2: 99% 99% 97% 100%  Weight:  49.4 kg    Height:  5\' 4"  (1.626 m)     CBC:  Recent Labs  Lab 07/17/20 1222  WBC 8.4  NEUTROABS 6.8  HGB 14.9  HCT 44.1  MCV 92.6  PLT 160   Basic Metabolic Panel:  Recent Labs  Lab 07/17/20 1222  NA 141  K 3.6  CL 107  CO2 26  GLUCOSE 97  BUN 21  CREATININE 0.73  CALCIUM 9.5   Lipid Panel:  Recent Labs  Lab 07/17/20 1222  CHOL 243*  TRIG 76  HDL 68  CHOLHDL 3.6  VLDL 15  LDLCALC 160*   HgbA1c:  Recent Labs  Lab 07/17/20 1222  HGBA1C 5.6   Urine Drug Screen: No results for input(s): LABOPIA, COCAINSCRNUR, LABBENZ, AMPHETMU, THCU, LABBARB in the last 168 hours.  Alcohol Level No results for input(s): ETH in the last 168 hours.  IMAGING past 24 hours CT ANGIO HEAD W OR WO CONTRAST  Result Date: 07/17/2020 CLINICAL DATA:  Infarcts on MRI EXAM: CT ANGIOGRAPHY HEAD AND NECK TECHNIQUE: Multidetector CT imaging of the head and neck was performed using the standard protocol during bolus administration of intravenous contrast. Multiplanar CT image reconstructions and MIPs were obtained to evaluate the vascular anatomy. Carotid stenosis measurements (when applicable) are obtained utilizing NASCET criteria,  using the distal internal carotid diameter as the denominator. CONTRAST:  39mL OMNIPAQUE IOHEXOL 350 MG/ML SOLN COMPARISON:  Correlation made with same day brain FINDINGS: CTA NECK Aortic arch: Great vessel origins are patent. Right carotid system: Patent. Mild calcified plaque at the ICA origin causing minimal stenosis. Left carotid system: Patent. Minimal calcified plaque at the ICA origin without stenosis. Vertebral arteries: Patent and codominant. Skeleton: Degenerative changes of the cervical spine. Other neck: Negative. Upper chest: Included upper lungs are clear. Review of the MIP images confirms the above findings CTA HEAD Anterior circulation: Intracranial internal carotid arteries are patent with mild calcified plaque. Right M1 MCA is patent. There is nearly occlusive thrombus within the proximal posterior division resulting in diminished opacification of distal vessels. Left middle and both anterior cerebral arteries are patent. Posterior circulation: Intracranial vertebral arteries, basilar artery, and posterior cerebral arteries are patent. Left posterior communicating artery is present. Moderate stenosis at the proximal right P2 PCA. Venous sinuses: Patent as allowed by contrast bolus timing. Review of the MIP images confirms the above findings IMPRESSION: Nearly occlusive thrombus within the proximal posterior division of the right MCA. Diminished flow within more distal vessels. Moderate stenosis proximal right P2 PCA. No significant stenosis in the neck. Primary result called  by telephone at the time of interpretation on 07/17/2020 at 8:40 pm to provider Pecolia Ades, who verbally acknowledged these results. Electronically Signed   By: Macy Mis M.D.   On: 07/17/2020 20:46   DG Pelvis 1-2 Views  Result Date: 07/17/2020 CLINICAL DATA:  Fall. EXAM: PELVIS - 1-2 VIEW COMPARISON:  January 31, 2015. FINDINGS: Status post left hip arthroplasty. No fracture or dislocation is noted. Mild  degenerative changes seen involving the right hip. IMPRESSION: No acute abnormality seen in the pelvis. Electronically Signed   By: Marijo Conception M.D.   On: 07/17/2020 13:58   DG Ankle Complete Left  Result Date: 07/17/2020 CLINICAL DATA:  Fall. EXAM: LEFT ANKLE COMPLETE - 3+ VIEW COMPARISON:  None. FINDINGS: There is no evidence of fracture, dislocation, or joint effusion. There is no evidence of arthropathy or other focal bone abnormality. Soft tissues are unremarkable. IMPRESSION: Negative. Electronically Signed   By: Marijo Conception M.D.   On: 07/17/2020 14:01   DG Ankle Complete Right  Result Date: 07/17/2020 CLINICAL DATA:  Fall. EXAM: RIGHT ANKLE - COMPLETE 3+ VIEW COMPARISON:  None. FINDINGS: There is no evidence of fracture, dislocation, or joint effusion. There is no evidence of arthropathy or other focal bone abnormality. Soft tissues are unremarkable. IMPRESSION: Negative. Electronically Signed   By: Marijo Conception M.D.   On: 07/17/2020 14:01   DG Abdomen 1 View  Result Date: 07/17/2020 CLINICAL DATA:  Fall. EXAM: ABDOMEN - 1 VIEW COMPARISON:  February 04, 2014. FINDINGS: The bowel gas pattern is normal. No radio-opaque calculi or other significant radiographic abnormality are seen. Surgical clips are seen in the pelvis. IMPRESSION: No evidence of bowel obstruction or ileus. Aortic Atherosclerosis (ICD10-I70.0). Electronically Signed   By: Marijo Conception M.D.   On: 07/17/2020 14:00   CT Head Wo Contrast  Result Date: 07/17/2020 CLINICAL DATA:  Status post fall.  Initial encounter. EXAM: CT HEAD WITHOUT CONTRAST CT CERVICAL SPINE WITHOUT CONTRAST TECHNIQUE: Multidetector CT imaging of the head and cervical spine was performed following the standard protocol without intravenous contrast. Multiplanar CT image reconstructions of the cervical spine were also generated. COMPARISON:  None. FINDINGS: CT HEAD FINDINGS Brain: No evidence of acute infarction, hemorrhage, hydrocephalus,  extra-axial collection or mass lesion/mass effect. Atrophy and chronic microvascular ischemic change noted. Vascular: No hyperdense vessel or unexpected calcification. Skull: No fracture or focal lesion. Sinuses/Orbits: Motion limits evaluation. No abnormality is seen. The patient is status post cataract surgery Other: None. CT CERVICAL SPINE FINDINGS Alignment: Trace anterolisthesis C3 on C4. Straightening of lordosis. Skull base and vertebrae: No acute fracture. No primary bone lesion or focal pathologic process. Soft tissues and spinal canal: No prevertebral fluid or swelling. No visible canal hematoma. Disc levels: Mild loss of disc space height and endplate spurring are seen at C5-6. Upper chest: Clear. Other: None. IMPRESSION: No acute abnormality head or cervical spine. Cortical atrophy and chronic microvascular ischemic change. Mild degenerative disc disease C5-6. Electronically Signed   By: Inge Rise M.D.   On: 07/17/2020 14:42   CT ANGIO NECK W OR WO CONTRAST  Result Date: 07/17/2020 CLINICAL DATA:  Infarcts on MRI EXAM: CT ANGIOGRAPHY HEAD AND NECK TECHNIQUE: Multidetector CT imaging of the head and neck was performed using the standard protocol during bolus administration of intravenous contrast. Multiplanar CT image reconstructions and MIPs were obtained to evaluate the vascular anatomy. Carotid stenosis measurements (when applicable) are obtained utilizing NASCET criteria, using the distal internal carotid diameter  as the denominator. CONTRAST:  2mL OMNIPAQUE IOHEXOL 350 MG/ML SOLN COMPARISON:  Correlation made with same day brain FINDINGS: CTA NECK Aortic arch: Great vessel origins are patent. Right carotid system: Patent. Mild calcified plaque at the ICA origin causing minimal stenosis. Left carotid system: Patent. Minimal calcified plaque at the ICA origin without stenosis. Vertebral arteries: Patent and codominant. Skeleton: Degenerative changes of the cervical spine. Other neck:  Negative. Upper chest: Included upper lungs are clear. Review of the MIP images confirms the above findings CTA HEAD Anterior circulation: Intracranial internal carotid arteries are patent with mild calcified plaque. Right M1 MCA is patent. There is nearly occlusive thrombus within the proximal posterior division resulting in diminished opacification of distal vessels. Left middle and both anterior cerebral arteries are patent. Posterior circulation: Intracranial vertebral arteries, basilar artery, and posterior cerebral arteries are patent. Left posterior communicating artery is present. Moderate stenosis at the proximal right P2 PCA. Venous sinuses: Patent as allowed by contrast bolus timing. Review of the MIP images confirms the above findings IMPRESSION: Nearly occlusive thrombus within the proximal posterior division of the right MCA. Diminished flow within more distal vessels. Moderate stenosis proximal right P2 PCA. No significant stenosis in the neck. Primary result called by telephone at the time of interpretation on 07/17/2020 at 8:40 pm to provider Pecolia Ades, who verbally acknowledged these results. Electronically Signed   By: Macy Mis M.D.   On: 07/17/2020 20:46   CT Cervical Spine Wo Contrast  Result Date: 07/17/2020 CLINICAL DATA:  Status post fall.  Initial encounter. EXAM: CT HEAD WITHOUT CONTRAST CT CERVICAL SPINE WITHOUT CONTRAST TECHNIQUE: Multidetector CT imaging of the head and cervical spine was performed following the standard protocol without intravenous contrast. Multiplanar CT image reconstructions of the cervical spine were also generated. COMPARISON:  None. FINDINGS: CT HEAD FINDINGS Brain: No evidence of acute infarction, hemorrhage, hydrocephalus, extra-axial collection or mass lesion/mass effect. Atrophy and chronic microvascular ischemic change noted. Vascular: No hyperdense vessel or unexpected calcification. Skull: No fracture or focal lesion. Sinuses/Orbits: Motion  limits evaluation. No abnormality is seen. The patient is status post cataract surgery Other: None. CT CERVICAL SPINE FINDINGS Alignment: Trace anterolisthesis C3 on C4. Straightening of lordosis. Skull base and vertebrae: No acute fracture. No primary bone lesion or focal pathologic process. Soft tissues and spinal canal: No prevertebral fluid or swelling. No visible canal hematoma. Disc levels: Mild loss of disc space height and endplate spurring are seen at C5-6. Upper chest: Clear. Other: None. IMPRESSION: No acute abnormality head or cervical spine. Cortical atrophy and chronic microvascular ischemic change. Mild degenerative disc disease C5-6. Electronically Signed   By: Inge Rise M.D.   On: 07/17/2020 14:42   MR BRAIN WO CONTRAST  Result Date: 07/17/2020 CLINICAL DATA:  Unwitnessed fall, questionable left arm/grip weakness EXAM: MRI HEAD WITHOUT CONTRAST TECHNIQUE: Multiplanar, multiecho pulse sequences of the brain and surrounding structures were obtained without intravenous contrast. COMPARISON:  None. FINDINGS: Motion degraded DWI and sagittal T1 sequences were obtained. Patient could not tolerate remainder of the study. There is patchy diffusion hyperintensity in the right frontal, parietal, and temporal lobes. IMPRESSION: Two sequences performed with significant motion degradation. Likely acute to subacute right MCA territory infarcts. Electronically Signed   By: Macy Mis M.D.   On: 07/17/2020 17:35    PHYSICAL EXAM Blood pressure (!) 152/73, pulse (!) 58, temperature 98.3 F (36.8 C), temperature source Oral, resp. rate 17, height 5\' 4"  (1.626 m), weight 49.4 kg, SpO2 100 %.  General: alert and awake, elderly Caucasian female, no apparent distress  Lungs: Symmetrical Chest rise, no labored breathing  Cardio: Regular Rate and Rhythm  Abdomen: Soft, non-tender  Neuro: Alert,only oriented to person and place, not time, thought content appropriate.  Speech not fully fluent.  Able to follow simple commands with repetition. Cranial Nerves: II:  Possible Left inferior quaddrantopia, pupils equal, round, reactive to light and accommodation III,IV, VI: ptosis not present, extra-ocular motions intact bilaterally V,VII: smile symmetric, facial light touch sensation normal bilaterally VIII: hearing normal bilaterally IX,X: uvula rises symmetrically XI: bilateral shoulder shrug XII: midline tongue extension without atrophy or fasciculations  Motor: Right : Upper extremity   3/5    Left:     Upper extremity   3/5  Lower extremity   3/5     Lower extremity   3/5 Tone and bulk:normal tone throughout Sensory: light touch intact throughout, bilaterally Cerebellar: normal finger-to-nose Gait: deferred    ASSESSMENT/PLAN Ms. Monica Ward is a 87 y.o. female with history of chronic constipation, diverticulitis, SBO's, colectomy, appendectomy, hysterectomy presenting with acute onset of confusion and agitation. Was brought from home via EMS with agitation and confusion after being found on the floor by her son at 65 AM today. There was bruising to her left forearm and left lower leg with a skin tear on the left shin. The son does not know when the patient fell, but believes that it was some time in the early morning. LKN was 9:30 PM yesterday when she was going to bed. He states when he was assisting her to stand this morning, she was confused with left sided weakness and slurred speech. On arrival to the ED the patient was grabbing at vaginal area stating it hurts and also had frequent urination. She was confused and "pulling/grabbing at everything". A UTI is suspected by the ED physician.  2D Echo and DVT are ordered and pending. Evaluation by PT/OT are pending. Will continue with DAPT but will increase Aspirin to 325 mg. Will start Atorvastatin 40 mg. Will have loop recorder placed.  Right MCA Acute/Subacute Infarct, embolic pattern, concerning for cardiac source  CT  head - No acute abnormality head.  CTA head & neck - Nearly occlusive thrombus within the proximal posterior division of the right MCA. Diminished flow within more distal vessels. Moderate stenosis proximal right P2 PCA. No significant stenosis in the neck.  MRI - limited sequences but acute to subacute right MCA territory infarcts.  2D Echo EF 60-65%  LE venous Doppler - no DVT  Recommend loop recorder to rule out A. Fib   LDL 160  HgbA1c 5.6  VTE prophylaxis - Lovenox  aspirin 81 mg daily prior to admission, now on aspirin 325 mg daily and clopidogrel 75 mg daily.  Continue DAPT for 3 months and then Plavix alone given right MCA occlusion.  Therapy recommendations:  Pending  Disposition:  Pending  Hypertension  Home meds: none  Permissive hypertension (OK if <220/120) for 24-48 hours post stroke and then gradually decrease to goal within 3-5 days. . Long-term BP goal 130-150 given right MCA occlusion  Hyperlipidemia  Home meds:  none  LDL 160, goal < 70  Add Atorvastatin 40 mg daily   High intensity statin Atorvastatin 40 mg daily  Continue statin at discharge  Other Stroke Risk Factors  Advanced Age >/= 62   Other Active Problems  ? 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 Pt was seen and examined.   87 year old female with history of hyperlipidemia, abdominal tumor status post resection and radiation nearly 30 years ago without recurrence admitted for fell at home and found on the floor by son with confusion, left-sided weakness, slurred speech.  CT head no acute abnormality.  CTA head and neck right M2 occlusion, right P2 stenosis.  MRI showed right MCA territory scattered patchy infarcts.  2D echo EF 60 to 65%, LE venous Doppler no DVT.   A1c 5.6, LDL 160.  UA negative.  On exam, patient daughter and son are at bedside.  Patient lethargic, however eyes open, following simple commands.  Paucity of speech, however no aphasia, able to name and repeat.  No significant facial droop, suspicious left lower quadrantanopsia, no gaze palsy.  Left upper extremity mild pronator drift and decreased dexterity on left hand.  Mild lower extremity drift on the left. Right LE no drift and BLE distal ankle DF/PF 4/5.  Sensation symmetrical subjectively.  Finger-to-nose grossly intact bilaterally.  Etiology for patient stroke concerning for cardioembolic source.  Loop recorder to rule out A. fib.  Per daughter, patient does have history of irregular heartbeat but cannot further elaborate on that.  However, patient denies any heart history.  Currently no A. fib on telemetry.  Continue aspirin and Plavix for 3 months and then Plavix alone given right MCA occlusion.  Continue Lipitor 40.  PT/OT recommend SNF.  Encourage patient working hard with PT/OT.  Will follow.  For detailed assessment and plan, please refer to above as I have made changes wherever appropriate.   Rosalin Hawking, MD PhD Stroke Neurology 07/18/2020 2:30 PM     To contact Stroke Continuity provider, please refer to [email or web address] After hours, contact General Neurology

## 2020-07-18 NOTE — Evaluation (Signed)
Physical Therapy Evaluation Patient Details Name: Monica Ward MRN: [id number] DOB: [birth year] Today's Date: 07/18/2020   History of Present Illness  Pt is an 87 y/o female admitted 07/17/20 after being found down at home for unknown amount of time. 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 PMH significant for SBO 2011, leiomyosarcoma, diverticulitis, diverticuosis, CA, L THR.    Clinical Impression  Pt admitted with above diagnosis. Pt currently with functional limitations due to the deficits listed below (see PT Problem List). At the time of PT eval pt was able to perform transfers with up to +2 max assist for balance support and safety. Max cues required for sequencing at times. Formal MMT difficult as pt with poor sitting balance and slightly impulsive at times. She is grossly weak and may have some mild L sided weakness compared to R, but will further assess next session. Pt also with difficulty finding midline and with R gaze preference. Son and daughter present and we discussed rehab options. At this time SNF would be the most appropriate discharge disposition. Family inquired about inpatient rehab, however based on performance today I do not feel she would be appropriate for a high intensity rehab program. If pt perks up and is able to tolerate more functional activity in the future, we can revisit the possibility of a CIR admission. Acutely, pt will benefit from skilled PT to increase their independence and safety with mobility to allow discharge to the venue listed below.       Follow Up Recommendations SNF;Supervision/Assistance - 24 hour    Equipment Recommendations  Rolling walker with 5" wheels;Wheelchair (measurements PT);Wheelchair cushion (measurements PT)    Recommendations for Other Services       Precautions / Restrictions Precautions Precautions: Fall Restrictions Weight Bearing Restrictions: No      Mobility  Bed Mobility Overal bed mobility: Needs  Assistance Bed Mobility: Rolling;Sidelying to Sit;Sit to Supine Rolling: Max assist Sidelying to sit: Max assist   Sit to supine: Max assist;+2 for physical assistance   General bed mobility comments: Hand-over-hand assist required to reach for the railings and max assist to facilitate transition to EOB. Pt impulsive with attempting to stand up and moments later attempting to lay back down. +2 required for return to supine due to fatigue.    Transfers Overall transfer level: Needs assistance Equipment used: 2 person hand held assist Transfers: Sit to/from [facility] Sit to Stand: +2 physical assistance;Mod assist;Max assist Stand pivot transfers: +2 physical assistance;Mod assist;Max assist       General transfer comment: +2 assist for transfer to/from [facility]. Transfer to [facility] required +2 mod assist and transfer back to the bed required +2 max assist. On return to EOB, pt with inreased difficulty advancing feet around and frantically reaching out for external support of bedrail, pushing herself away from the direction we were attempting to go. Therapist manually removing pt's hand from the bed rail in order to complete transfer back to sitting EOB.  Ambulation/Gait             General Gait Details: Unable to progress to gait training this session.  Stairs            Wheelchair Mobility    Modified Rankin (Stroke Patients Only) Modified Rankin (Stroke Patients Only) Pre-Morbid Rankin Score: No significant disability Modified Rankin: Moderately severe disability     Balance Overall balance assessment: Needs assistance Sitting-balance support: Bilateral upper extremity supported;Feet supported Sitting balance-Leahy Scale: Poor   Postural  control: Posterior lean Standing balance support: Bilateral upper extremity supported;During functional activity Standing balance-Leahy Scale: Zero Standing balance comment: +2 assist required to maintain standing.                              Pertinent Vitals/Pain Pain Assessment: Faces Faces Pain Scale: Hurts little more Pain Location: Generalized, but also complains of headache and "soreness" under arms. Pain Descriptors / Indicators: Headache;Sore Pain Intervention(s): Limited activity within patient's tolerance;Monitored during session;Repositioned    Home Living Family/patient expects to be discharged to:: Private residence Living Arrangements: Alone;Children Available Help at Discharge: Family Type of Home: Apartment Home Access: Level entry (1 step up)     Home Layout: One level Home Equipment: Bedside commode;Shower seat;Hand held shower head;Grab bars - tub/shower;Cane - single point Additional Comments: Son checks in on her - sees her in the morning and night. He was working remotely but is now back at work and unable to provide 24 hour assist.    Prior Function Level of Independence: Independent with assistive device(s)         Comments: Washes hair in the kitchen sink, and sits side of tub to wash. Son states she has a formal shower about once a month. Son does most of the cooking. Pt does not drive. She goes out to the store 1x/week with son/daughter.     Hand Dominance   Dominant Hand: Right    Extremity/Trunk Assessment   Upper Extremity Assessment Upper Extremity Assessment: Defer to OT evaluation    Lower Extremity Assessment Lower Extremity Assessment: Generalized weakness;LLE deficits/detail LLE Deficits / Details: Noted laceration on shin and bruising on shin and foot. Unable to formally test strength due to poor sitting balance, impulsivity to try and stand up, and diffculty following commands.    Cervical / Trunk Assessment Cervical / Trunk Assessment: Kyphotic;Other exceptions Cervical / Trunk Exceptions: Forward head posture with rounded shoulders.  Communication   Communication: Expressive difficulties  Cognition Arousal/Alertness:  Awake/alert Behavior During Therapy: Impulsive Overall Cognitive Status: Impaired/Different from baseline Area of Impairment: Memory;Orientation;Attention;Following commands;Safety/judgement;Awareness;Problem solving                 Orientation Level: Disoriented to;Place;Time;Situation Current Attention Level: Sustained Memory: Decreased short-term memory Following Commands: Follows one step commands inconsistently;Follows one step commands with increased time Safety/Judgement: Decreased awareness of safety;Decreased awareness of deficits Awareness: Intellectual Problem Solving: Slow processing;Decreased initiation;Requires verbal cues;Difficulty sequencing;Requires tactile cues General Comments: Difficulty understanding what therapist was asking her to do at times and still struggled to follow commands even with hand-over-hand assist. Slow processing and pt very confused about why she is here. Unable to tell she was in the hospital. May have some L inattention.      General Comments      Exercises     Assessment/Plan    PT Assessment Patient needs continued PT services  PT Problem List Decreased strength;Decreased range of motion;Decreased activity tolerance;Decreased balance;Decreased mobility;Decreased knowledge of use of DME;Decreased safety awareness;Decreased knowledge of precautions;Pain       PT Treatment Interventions DME instruction;Gait training;Stair training;Functional mobility training;Therapeutic activities;Therapeutic exercise;Neuromuscular re-education;Cognitive remediation;Patient/family education;Balance training    PT Goals (Current goals can be found in the Care Plan section)  Acute Rehab PT Goals Patient Stated Goal: Pt did not state goals. PT Goal Formulation: Patient unable to participate in goal setting Time For Goal Achievement: 08/01/20 Potential to Achieve Goals: Good    Frequency Min 3X/week  Barriers to discharge        Co-evaluation                AM-PAC PT "6 Clicks" Mobility  Outcome Measure Help needed turning from your back to your side while in a flat bed without using bedrails?: A Lot Help needed moving from lying on your back to sitting on the side of a flat bed without using bedrails?: A Lot Help needed moving to and from a bed to a chair (including a wheelchair)?: A Lot Help needed standing up from a chair using your arms (e.g., wheelchair or bedside chair)?: A Lot Help needed to walk in hospital room?: Total Help needed climbing 3-5 steps with a railing? : Total 6 Click Score: 10    End of Session Equipment Utilized During Treatment: Gait belt Activity Tolerance: Patient tolerated treatment well Patient left: in bed;with call bell/phone within reach;with bed alarm set;with nursing/sitter in room;with family/visitor present Nurse Communication: Mobility status PT Visit Diagnosis: Unsteadiness on feet (R26.81);Other abnormalities of gait and mobility (R26.89);Repeated falls (R29.6);Muscle weakness (generalized) (M62.81);History of falling (Z91.81);Difficulty in walking, not elsewhere classified (R26.2);Ataxic gait (R26.0);Adult, failure to thrive (R62.7)    Time: 1322-1406 PT Time Calculation (min) (ACUTE ONLY): 44 min   Charges:   PT Evaluation $PT Eval Moderate Complexity: 1 Mod PT Treatments $Therapeutic Activity: 23-37 mins        [name], PT, DPT Acute Rehabilitation Services Pager: [phone] Office: [phone]   [name] 07/18/2020, 2:50 PM

## 2020-07-18 NOTE — Plan of Care (Signed)

## 2020-07-18 NOTE — NC FL2 (Addendum)
[place] LEVEL OF CARE SCREENING TOOL     IDENTIFICATION  Patient Name: Monica Ward Birthdate: 08-17-1929 Sex: female Admission Date (Current Location): 07/17/2020  [facility] [place] Number:  Herbalist and Address:  [facility] [place]. [facility], [place] [address], [place], [place] [address]      Provider Number: [id number]  Attending Physician Name and Address:  Charlynne Cousins, MD  Relative Name and Phone Number:       Current Level of Care: Hospital Recommended Level of Care: [place] Prior Approval Number:    Date Approved/Denied:   PASRR Number: [id number]  Discharge Plan: SNF    Current Diagnoses: Patient Active Problem List   Diagnosis Date Noted  . Acute lower UTI 07/18/2020  . CVA (cerebral vascular accident) ([place]) 07/18/2020  . Acute ischemic right MCA stroke ([place]) 07/17/2020  . History of hip fracture 10/02/2016  . 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 History of small bowel obstruction 10/02/2016  . Age-related osteoporosis without current pathological fracture 10/02/2016  . Moderate malnutrition ([place]) 02/05/2015  . Thrombocytopenia ([place]) 02/02/2015  . High cholesterol   . Constipation, chronic 02/06/2014  . Diverticulosis 02/03/2014  . B12 deficiency 02/03/2014    Orientation RESPIRATION BLADDER Height & Weight     Self  Normal Incontinent Weight: 108 lb 14.5 oz (49.4 kg) Height:  5\' 4"  (162.6 cm)  BEHAVIORAL SYMPTOMS/MOOD NEUROLOGICAL BOWEL NUTRITION STATUS      Continent Diet (See DC Summary)  AMBULATORY STATUS COMMUNICATION OF NEEDS Skin   Extensive Assist Verbally Normal                       Personal Care Assistance Level of Assistance  Bathing,Dressing,Feeding Bathing Assistance: Maximum assistance Feeding assistance: Maximum assistance Dressing Assistance: Maximum assistance     Functional Limitations Info  Sight,Hearing,Speech Sight Info: Adequate Hearing Info:  Adequate Speech Info: Impaired (Dysarthria)    SPECIAL CARE FACTORS FREQUENCY  PT (By licensed PT),OT (By licensed OT),Speech therapy     PT Frequency: 5x/week OT Frequency: 5x/week     Speech Therapy Frequency: 5x/week      Contractures Contractures Info: Not present    Additional Factors Info  Code Status,Allergies Code Status Info: Full Allergies Info: NKA           Current Medications (07/18/2020):  This is the current hospital active medication list Current Facility-Administered Medications  Medication Dose Route Frequency Provider Last Rate Last Admin  . acetaminophen (TYLENOL) tablet 650 mg  650 mg Oral Q4H PRN Lenore Cordia, MD   650 mg at 07/18/20 1056   Or  . acetaminophen (TYLENOL) 160 MG/5ML solution 650 mg  650 mg Per Tube Q4H PRN Lenore Cordia, MD       Or  . acetaminophen (TYLENOL) suppository 650 mg  650 mg Rectal Q4H PRN Lenore Cordia, MD      . Derrill Memo ON 07/19/2020] aspirin EC tablet 325 mg  325 mg Oral Daily Rosalin Hawking, MD      . atorvastatin (LIPITOR) tablet 80 mg  80 mg Oral Daily Charlynne Cousins, MD   80 mg at 07/18/20 1100  . bacitracin ointment   Topical BID Kinnie Feil, [place]   Given at 07/18/20 0932  . cefTRIAXone (ROCEPHIN) 1 g in sodium chloride 0.9 % 100 mL IVPB  1 g Intravenous Q24H Zada Finders R, MD      . clopidogrel (PLAVIX) tablet 75  mg  75 mg Oral Daily Lenore Cordia, MD   75 mg at 07/18/20 1783  . enoxaparin (LOVENOX) injection 40 mg  40 mg Subcutaneous Q24H Zada Finders R, MD   40 mg at 07/17/20 2255  . labetalol (NORMODYNE) injection 5 mg  5 mg Intravenous Q4H PRN Zada Finders R, MD      . lactated ringers infusion   Intravenous Continuous Kinnie Feil, PA-C 100 mL/hr at 07/17/20 1351 New Bag at 07/17/20 1351  . senna-docusate (Senokot-S) tablet 1 tablet  1 tablet Oral QHS PRN Lenore Cordia, MD         Discharge Medications: Please see discharge summary for a list of discharge medications.  Relevant  Imaging Results:  Relevant Lab Results:   Additional Information SSN: [id number]  Marney Setting, Student-Social Work

## 2020-07-18 NOTE — Evaluation (Signed)
Speech Language Pathology Evaluation Patient Details Name: Monica Ward MRN: [id number] DOB: 11/29/1929 Today's Date: 07/18/2020 Time: 8099-8338 SLP Time Calculation (min) (ACUTE ONLY): 27 min  Problem List:  Patient Active Problem List   Diagnosis Date Noted  . Acute lower UTI 07/18/2020  . CVA (cerebral vascular accident) ([place]) 07/18/2020  . Acute ischemic right MCA stroke ([place]) 07/17/2020  . History of hip fracture 10/02/2016  . 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 History of small bowel obstruction 10/02/2016  . Age-related osteoporosis without current pathological fracture 10/02/2016  . Moderate malnutrition ([place]) 02/05/2015  . Thrombocytopenia ([place]) 02/02/2015  . High cholesterol   . Constipation, chronic 02/06/2014  . Diverticulosis 02/03/2014  . B12 deficiency 02/03/2014   Past Medical History:  Past Medical History:  Diagnosis Date  . Arthritis    "probably" (01/28/2012)  . Cancer ([place]) Around 2002-2003   Unclear history of "7 lb" abdominal tumor treated at [facility] ~2003 s/p open abdominal colonic resection and radiation  . Complication of anesthesia    "trouble waking up; too much med given" (01/28/2012)  . Constipation 02/03/2014  . Constipation, chronic 02/06/2014  . Diverticulitis    Per chart   . Diverticulosis 02/03/2014  . High cholesterol   . Leiomyosarcoma ([place]) 10/28/2011   Patient had surgery for a spindle cell tumor in 1999. Patient had surgery in 2000 and mass which was presumed to be a leiomyosarcoma rather than a spindle cell cancer. I have no further records of path reports to give me a good definite path diagnosis of her tumor resection.   . Precordial pain 02/12/2012  . SBO (small bowel obstruction) ([place]) 08/2009   Early, partial SBO improved with conservative Tx    Past Surgical History:  Past Surgical History:  Procedure Laterality Date  . ABDOMINAL HYSTERECTOMY  ~ 1985   ovaries resected B; DUB.  Marland Kitchen APPENDECTOMY  1968  . CATARACT EXTRACTION,  BILATERAL  09/27/2015  . [place]  . COLECTOMY  2003   "cancer"  . TONSILLECTOMY     "when I was a child"  . TOTAL HIP ARTHROPLASTY Left 01/31/2015   Procedure:  ANTERIOR APPROACH  LEFT HEMI ARTHROPLASTY;  Surgeon: Rod Can, MD;  Location: [facility] ORS;  Service: Orthopedics;  Laterality: Left;  . TUMOR REMOVAL  1990's;  X 2   "benign"   HPI:  Pt is an 87 y.o. female with R MCA. MRI 3/22: Two sequences performed with significant motion degradation. Likely acute to subacute right MCA territory infarcts. PMH significant for diverticulosis, diverticulities, cancer, arthritis.   Assessment / Plan / Recommendation Clinical Impression  Pt demonstrates cognitive impairments that are different from her baseline. Her son reports diffiuclty with recall and STM but believes her communication has improved since admission. Her daughter also reports concerns about her memory as well as potential for mild dementia. Her strengths include immediate recall as well as following simple commands. She shows good potential in storage of information as well as working Marine scientist but long term recall will likely remain an area of difficulty for her. She also showed decreased intellectual awareness as well as the ability to generate information during a divergent naming task. However, she showed good sustained attention during this task and throughout today's assessment until she began to felt fatigued. SLP will sign off at this time but she is stimulable for working on her memory with speech therapy at her next level of care.    SLP Assessment  SLP Recommendation/Assessment: All further  Speech [place] Pathology  needs can be addressed in the next venue of care SLP Visit Diagnosis: Cognitive communication deficit (R41.841)    Follow Up Recommendations   (tba)    Frequency and Duration           SLP Evaluation Cognition  Overall Cognitive Status: Impaired/Different from baseline Arousal/Alertness:  Awake/alert Orientation Level: Oriented to place Attention: Sustained Sustained Attention: Appears intact Memory: Impaired Memory Impairment: Storage deficit;Decreased recall of new information Awareness: Impaired Awareness Impairment: Intellectual impairment Problem Solving: Impaired Problem Solving Impairment: Verbal basic Safety/Judgment: Impaired       Comprehension  Auditory Comprehension Overall Auditory Comprehension: Appears within functional limits for tasks assessed    Expression Expression Primary Mode of Expression: Verbal   Oral / Motor      GO                    Jeanine Luz., SLP Student 07/18/2020, 1:54 PM

## 2020-07-18 NOTE — Progress Notes (Signed)
[facility] PROGRESS NOTE    Progress Note  Monica Ward  INO:[id number] DOB: 1930-04-11 DOA: 07/17/2020 PCP: Horald Pollen, MD     Brief Narrative:   Monica Ward is an 87 y.o. female with past medical history of hyperlipidemia, small bowel obstruction leiomyosarcoma presents to the ED for evaluation after she was found down at home most of the history was obtained from the ED note in sinus the patient cannot provide history.  Was found on the day of admission in the bathroom was transferred to the ED was found lethargic and with a dysarthric speech, was found to have an acute to subacute right MCA CVA  Significant studies: 07/17/2020 CT of the head showed no acute abnormalities. 07/17/2020 MRI of the brain showed an acute right MCA infarct 07/17/2020 showed nearly occlusive thrombus within the proximal posterior division of the right MCA  Antibiotics: None  Microbiology data: Blood culture:  Procedures: None  Assessment/Plan:   Acute ischemic right MCA stroke (HCC) HgbA1c 5.6, fasting lipid panel HDL > 40, LDL > 150 MRI, MRA of the brain without contrast  as above PT, OT, Speech consult keep the patient n.p.o. transthoracic Echo,   pending Start patient on ASA 81mg  daily and plavix 75mg  daily  Atorvastatin 80  BP goal: permissive HTN upto 220/120 mmHg Telemetry monitoring has shown no events NG tube placed  Possible acute lower UTI: As per family started empirically on Rocephin urine cultures are pending.    DVT prophylaxis: lovenox Family Communication:son Status is: Observation  The patient will require care spanning > 2 midnights and should be moved to inpatient because: Hemodynamically unstable  Dispo: The patient is from: Home              Anticipated d/c is to: SNF              Patient currently is not medically stable to d/c.   Difficult to place patient No  Code Status:     Code Status Orders  (From admission, onward)          Start     Ordered   07/17/20 2227  Full code  Continuous        07/17/20 2227        Code Status History    Date Active Date Inactive Code Status Order ID Comments User Context   01/31/2015 2011 02/02/2015 1940 Full Code [id number]  Janece Canterbury, MD Inpatient   02/03/2014 2001 02/06/2014 1826 Full Code [id number]  Louellen Molder, MD Inpatient   01/28/2012 1710 01/29/2012 1614 Full Code [id number]  Alfonzo Feller, RN Inpatient   08/18/2011 0124 08/19/2011 1534 Full Code [id number]  Roosvelt Harps, RN Inpatient   Advance Care Planning Activity        IV Access:    Peripheral IV   Procedures and diagnostic studies:   CT ANGIO HEAD W OR WO CONTRAST  Result Date: 07/17/2020 CLINICAL DATA:  Infarcts on MRI EXAM: CT ANGIOGRAPHY HEAD AND NECK TECHNIQUE: Multidetector CT imaging of the head and neck was performed using the standard protocol during bolus administration of intravenous contrast. Multiplanar CT image reconstructions and MIPs were obtained to evaluate the vascular anatomy. Carotid stenosis measurements (when applicable) are obtained utilizing NASCET criteria, using the distal internal carotid diameter as the denominator. CONTRAST:  73mL OMNIPAQUE IOHEXOL 350 MG/ML SOLN COMPARISON:  Correlation made with same day brain FINDINGS: CTA NECK Aortic arch: Great vessel origins are patent. Right carotid system: Patent. Mild calcified  plaque at the ICA origin causing minimal stenosis. Left carotid system: Patent. Minimal calcified plaque at the ICA origin without stenosis. Vertebral arteries: Patent and codominant. Skeleton: Degenerative changes of the cervical spine. Other neck: Negative. Upper chest: Included upper lungs are clear. Review of the MIP images confirms the above findings CTA HEAD Anterior circulation: Intracranial internal carotid arteries are patent with mild calcified plaque. Right M1 MCA is patent. There is nearly occlusive thrombus within the proximal posterior  division resulting in diminished opacification of distal vessels. Left middle and both anterior cerebral arteries are patent. Posterior circulation: Intracranial vertebral arteries, basilar artery, and posterior cerebral arteries are patent. Left posterior communicating artery is present. Moderate stenosis at the proximal right P2 PCA. Venous sinuses: Patent as allowed by contrast bolus timing. Review of the MIP images confirms the above findings IMPRESSION: Nearly occlusive thrombus within the proximal posterior division of the right MCA. Diminished flow within more distal vessels. Moderate stenosis proximal right P2 PCA. No significant stenosis in the neck. Primary result called by telephone at the time of interpretation on 07/17/2020 at 8:40 pm to provider Pecolia Ades, who verbally acknowledged these results. Electronically Signed   By: Macy Mis M.D.   On: 07/17/2020 20:46   DG Pelvis 1-2 Views  Result Date: 07/17/2020 CLINICAL DATA:  Fall. EXAM: PELVIS - 1-2 VIEW COMPARISON:  January 31, 2015. FINDINGS: Status post left hip arthroplasty. No fracture or dislocation is noted. Mild degenerative changes seen involving the right hip. IMPRESSION: No acute abnormality seen in the pelvis. Electronically Signed   By: Marijo Conception M.D.   On: 07/17/2020 13:58   DG Ankle Complete Left  Result Date: 07/17/2020 CLINICAL DATA:  Fall. EXAM: LEFT ANKLE COMPLETE - 3+ VIEW COMPARISON:  None. FINDINGS: There is no evidence of fracture, dislocation, or joint effusion. There is no evidence of arthropathy or other focal bone abnormality. Soft tissues are unremarkable. IMPRESSION: Negative. Electronically Signed   By: Marijo Conception M.D.   On: 07/17/2020 14:01   DG Ankle Complete Right  Result Date: 07/17/2020 CLINICAL DATA:  Fall. EXAM: RIGHT ANKLE - COMPLETE 3+ VIEW COMPARISON:  None. FINDINGS: There is no evidence of fracture, dislocation, or joint effusion. There is no evidence of arthropathy or other focal  bone abnormality. Soft tissues are unremarkable. IMPRESSION: Negative. Electronically Signed   By: Marijo Conception M.D.   On: 07/17/2020 14:01   DG Abdomen 1 View  Result Date: 07/17/2020 CLINICAL DATA:  Fall. EXAM: ABDOMEN - 1 VIEW COMPARISON:  February 04, 2014. FINDINGS: The bowel gas pattern is normal. No radio-opaque calculi or other significant radiographic abnormality are seen. Surgical clips are seen in the pelvis. IMPRESSION: No evidence of bowel obstruction or ileus. Aortic Atherosclerosis (ICD10-I70.0). Electronically Signed   By: Marijo Conception M.D.   On: 07/17/2020 14:00   CT Head Wo Contrast  Result Date: 07/17/2020 CLINICAL DATA:  Status post fall.  Initial encounter. EXAM: CT HEAD WITHOUT CONTRAST CT CERVICAL SPINE WITHOUT CONTRAST TECHNIQUE: Multidetector CT imaging of the head and cervical spine was performed following the standard protocol without intravenous contrast. Multiplanar CT image reconstructions of the cervical spine were also generated. COMPARISON:  None. FINDINGS: CT HEAD FINDINGS Brain: No evidence of acute infarction, hemorrhage, hydrocephalus, extra-axial collection or mass lesion/mass effect. Atrophy and chronic microvascular ischemic change noted. Vascular: No hyperdense vessel or unexpected calcification. Skull: No fracture or focal lesion. Sinuses/Orbits: Motion limits evaluation. No abnormality is seen. The patient is status  post cataract surgery Other: None. CT CERVICAL SPINE FINDINGS Alignment: Trace anterolisthesis C3 on C4. Straightening of lordosis. Skull base and vertebrae: No acute fracture. No primary bone lesion or focal pathologic process. Soft tissues and spinal canal: No prevertebral fluid or swelling. No visible canal hematoma. Disc levels: Mild loss of disc space height and endplate spurring are seen at C5-6. Upper chest: Clear. Other: None. IMPRESSION: No acute abnormality head or cervical spine. Cortical atrophy and chronic microvascular ischemic change.  Mild degenerative disc disease C5-6. Electronically Signed   By: Inge Rise M.D.   On: 07/17/2020 14:42   CT ANGIO NECK W OR WO CONTRAST  Result Date: 07/17/2020 CLINICAL DATA:  Infarcts on MRI EXAM: CT ANGIOGRAPHY HEAD AND NECK TECHNIQUE: Multidetector CT imaging of the head and neck was performed using the standard protocol during bolus administration of intravenous contrast. Multiplanar CT image reconstructions and MIPs were obtained to evaluate the vascular anatomy. Carotid stenosis measurements (when applicable) are obtained utilizing NASCET criteria, using the distal internal carotid diameter as the denominator. CONTRAST:  13mL OMNIPAQUE IOHEXOL 350 MG/ML SOLN COMPARISON:  Correlation made with same day brain FINDINGS: CTA NECK Aortic arch: Great vessel origins are patent. Right carotid system: Patent. Mild calcified plaque at the ICA origin causing minimal stenosis. Left carotid system: Patent. Minimal calcified plaque at the ICA origin without stenosis. Vertebral arteries: Patent and codominant. Skeleton: Degenerative changes of the cervical spine. Other neck: Negative. Upper chest: Included upper lungs are clear. Review of the MIP images confirms the above findings CTA HEAD Anterior circulation: Intracranial internal carotid arteries are patent with mild calcified plaque. Right M1 MCA is patent. There is nearly occlusive thrombus within the proximal posterior division resulting in diminished opacification of distal vessels. Left middle and both anterior cerebral arteries are patent. Posterior circulation: Intracranial vertebral arteries, basilar artery, and posterior cerebral arteries are patent. Left posterior communicating artery is present. Moderate stenosis at the proximal right P2 PCA. Venous sinuses: Patent as allowed by contrast bolus timing. Review of the MIP images confirms the above findings IMPRESSION: Nearly occlusive thrombus within the proximal posterior division of the right MCA.  Diminished flow within more distal vessels. Moderate stenosis proximal right P2 PCA. No significant stenosis in the neck. Primary result called by telephone at the time of interpretation on 07/17/2020 at 8:40 pm to provider Pecolia Ades, who verbally acknowledged these results. Electronically Signed   By: Macy Mis M.D.   On: 07/17/2020 20:46   CT Cervical Spine Wo Contrast  Result Date: 07/17/2020 CLINICAL DATA:  Status post fall.  Initial encounter. EXAM: CT HEAD WITHOUT CONTRAST CT CERVICAL SPINE WITHOUT CONTRAST TECHNIQUE: Multidetector CT imaging of the head and cervical spine was performed following the standard protocol without intravenous contrast. Multiplanar CT image reconstructions of the cervical spine were also generated. COMPARISON:  None. FINDINGS: CT HEAD FINDINGS Brain: No evidence of acute infarction, hemorrhage, hydrocephalus, extra-axial collection or mass lesion/mass effect. Atrophy and chronic microvascular ischemic change noted. Vascular: No hyperdense vessel or unexpected calcification. Skull: No fracture or focal lesion. Sinuses/Orbits: Motion limits evaluation. No abnormality is seen. The patient is status post cataract surgery Other: None. CT CERVICAL SPINE FINDINGS Alignment: Trace anterolisthesis C3 on C4. Straightening of lordosis. Skull base and vertebrae: No acute fracture. No primary bone lesion or focal pathologic process. Soft tissues and spinal canal: No prevertebral fluid or swelling. No visible canal hematoma. Disc levels: Mild loss of disc space height and endplate spurring are seen at C5-6. Upper  chest: Clear. Other: None. IMPRESSION: No acute abnormality head or cervical spine. Cortical atrophy and chronic microvascular ischemic change. Mild degenerative disc disease C5-6. Electronically Signed   By: Inge Rise M.D.   On: 07/17/2020 14:42   MR BRAIN WO CONTRAST  Result Date: 07/17/2020 CLINICAL DATA:  Unwitnessed fall, questionable left arm/grip weakness  EXAM: MRI HEAD WITHOUT CONTRAST TECHNIQUE: Multiplanar, multiecho pulse sequences of the brain and surrounding structures were obtained without intravenous contrast. COMPARISON:  None. FINDINGS: Motion degraded DWI and sagittal T1 sequences were obtained. Patient could not tolerate remainder of the study. There is patchy diffusion hyperintensity in the right frontal, parietal, and temporal lobes. IMPRESSION: Two sequences performed with significant motion degradation. Likely acute to subacute right MCA territory infarcts. Electronically Signed   By: Macy Mis M.D.   On: 07/17/2020 17:35     Medical Consultants:    None.   Subjective:    Monica Ward has no new complaints, she is asking where she is at.  Objective:    Vitals:   07/17/20 2231 07/17/20 2356 07/18/20 0334 07/18/20 0738  BP: (!) 178/80 (!) 155/70 (!) 155/68 (!) 152/73  Pulse: 66 62 73 (!) 58  Resp: 18 18 18 17   Temp: (!) 97.5 F (36.4 C) 98.6 F (37 C) 97.7 F (36.5 C) 98.3 F (36.8 C)  TempSrc: Oral Oral Oral Oral  SpO2: 99% 99% 97% 100%  Weight:  49.4 kg    Height:  5\' 4"  (1.626 m)     SpO2: 100 %   Intake/Output Summary (Last 24 hours) at 07/18/2020 0925 Last data filed at 07/18/2020 0411 Gross per 24 hour  Intake 1206.55 ml  Output 350 ml  Net 856.55 ml   Filed Weights   07/17/20 2356  Weight: 49.4 kg    Exam: General exam: In no acute distress. Respiratory system: Good air movement and clear to auscultation. Cardiovascular system: S1 & S2 heard, RRR. No JVD.  Gastrointestinal system: Abdomen is nondistended, soft and nontender.  Extremities: No pedal edema. Skin: No rashes, lesions or ulcers Psychiatry: No judgment or insight of medical condition.   Data Reviewed:    Labs: Basic Metabolic Panel: Recent Labs  Lab 07/17/20 1222  NA 141  K 3.6  CL 107  CO2 26  GLUCOSE 97  BUN 21  CREATININE 0.73  CALCIUM 9.5   GFR Estimated Creatinine Clearance: 37.9 mL/min (by C-G  formula based on SCr of 0.73 mg/dL). Liver Function Tests: Recent Labs  Lab 07/17/20 1222  AST 23  ALT 16  ALKPHOS 55  BILITOT 0.4  PROT 6.9  ALBUMIN 3.9   Recent Labs  Lab 07/17/20 1222  LIPASE 27   No results for input(s): AMMONIA in the last 168 hours. Coagulation profile No results for input(s): INR, PROTIME in the last 168 hours. COVID-19 Labs  No results for input(s): DDIMER, FERRITIN, LDH, CRP in the last 72 hours.  No results found for: SARSCOV2NAA  CBC: Recent Labs  Lab 07/17/20 1222  WBC 8.4  NEUTROABS 6.8  HGB 14.9  HCT 44.1  MCV 92.6  PLT 319   Cardiac Enzymes: Recent Labs  Lab 07/17/20 1222  CKTOTAL 81   BNP (last 3 results) No results for input(s): PROBNP in the last 8760 hours. CBG: No results for input(s): GLUCAP in the last 168 hours. D-Dimer: No results for input(s): DDIMER in the last 72 hours. Hgb A1c: Recent Labs    07/17/20 1222  HGBA1C 5.6   Lipid  Profile: Recent Labs    07/17/20 1222  CHOL 243*  HDL 68  LDLCALC 160*  TRIG 76  CHOLHDL 3.6   Thyroid function studies: No results for input(s): TSH, T4TOTAL, T3FREE, THYROIDAB in the last 72 hours.  Invalid input(s): FREET3 Anemia work up: No results for input(s): VITAMINB12, FOLATE, FERRITIN, TIBC, IRON, RETICCTPCT in the last 72 hours. Sepsis Labs: Recent Labs  Lab 07/17/20 1222  WBC 8.4   Microbiology No results found for this or any previous visit (from the past 240 hour(s)).   Medications:   . [START ON 07/19/2020] aspirin EC  325 mg Oral Daily  . atorvastatin  40 mg Oral Daily  . bacitracin   Topical BID  . clopidogrel  75 mg Oral Daily  . enoxaparin (LOVENOX) injection  40 mg Subcutaneous Q24H   Continuous Infusions: . sodium chloride 75 mL/hr at 07/17/20 2307  . cefTRIAXone (ROCEPHIN)  IV    . lactated ringers 100 mL/hr at 07/17/20 1351      LOS: 0 days   Charlynne Cousins  [facility]  07/18/2020, 9:25 AM

## 2020-07-18 NOTE — Progress Notes (Signed)
   07/18/20 2138  Provider Notification  Provider Name/Title Dr Myna Hidalgo  Date Provider Notified 07/18/20  Time Provider Notified 2139  Notification Type Page  Notification Reason Other (Comment) (CCMD called pt HR was Afib, converted from [place] at 1735)  Provider response See new orders  Date of Provider Response 07/18/20  Time of Provider Response 2140 (pt back to SR at 2110)

## 2020-07-18 NOTE — Progress Notes (Signed)
  Echocardiogram 2D Echocardiogram has been performed.  Jennette Dubin 07/18/2020, 11:56 AM

## 2020-07-18 NOTE — Progress Notes (Signed)
PT Cancellation Note  Patient Details Name: Monica Ward MRN: [id number] DOB: April 17, 1930   Cancelled Treatment:    Reason Eval/Treat Not Completed: Patient at procedure or test/unavailable   [name] 07/18/2020, 12:34 PM   [name], PT, DPT Acute Rehabilitation Services Pager: [phone] Office: [phone]

## 2020-07-18 NOTE — Evaluation (Signed)
Occupational Therapy Evaluation Patient Details Name: Monica Ward MRN: [id number] DOB: 03-Nov-1929 Today's Date: 07/18/2020    History of Present Illness Pt is an 87 y/o female admitted 07/17/20 after being found down at home for unknown amount of time. 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 PMH significant for SBO 2011, leiomyosarcoma, diverticulitis, diverticuosis, CA, L THR.   Clinical Impression   Pt admitted for above and limited by problem list below, including impaired balance, generalized weakness, decreased activity tolerance, impaired vision/L inattention, L sided weakness/decreased coordination, and impaired cognition.  Patient engaged in limited evaluation, due to fatigue.  Patient disoriented to time and situation, follows simple 1 step commands with increased time but unable to follow multiple step commands or attend to tasks, with decreased recall and problem solving.  She is agreeable to EOB initially, but resists once therapist initiates movement towards EOB. She requires min-total assist for ADLs.  See below for details on visual testing, further assessment recommended. Based on performance today, patient will benefit from further OT services acutely and after dc at SNF level to optimize safety, independence with ADLs, mobility. Will follow.     Follow Up Recommendations  SNF;Supervision/Assistance - 24 hour    Equipment Recommendations  3 in 1 bedside commode    Recommendations for Other Services       Precautions / Restrictions Precautions Precautions: Fall Restrictions Weight Bearing Restrictions: No      Mobility Bed Mobility Overal bed mobility: Needs Assistance Bed Mobility: Rolling;Sidelying to Sit;Sit to Supine Rolling: Max assist Sidelying to sit: Max assist   Sit to supine: Max assist;+2 for physical assistance   General bed mobility comments: attempted to EOB, patient initally agreed.  Required hand over hand to reach rails and cueing for  technique, but resisted once therapist began to assist her therefore returned back to supine.    Transfers Overall transfer level: Needs assistance Equipment used: 2 person hand held assist Transfers: Sit to/from [facility] Sit to Stand: +2 physical assistance;Mod assist;Max assist Stand pivot transfers: +2 physical assistance;Mod assist;Max assist       General transfer comment: deferred as pt declined    Balance Overall balance assessment: Needs assistance Sitting-balance support: Bilateral upper extremity supported;Feet supported Sitting balance-Leahy Scale: Poor   Postural control: Posterior lean Standing balance support: Bilateral upper extremity supported;During functional activity Standing balance-Leahy Scale: Zero Standing balance comment: +2 assist required to maintain standing.                           ADL either performed or assessed with clinical judgement   ADL Overall ADL's : Needs assistance/impaired     Grooming: Minimal assistance;Bed level   Upper Body Bathing: Minimal assistance;Bed level   Lower Body Bathing: Maximal assistance;Bed level   Upper Body Dressing : Minimal assistance;Bed level   Lower Body Dressing: Total assistance;Bed level     Toilet Transfer Details (indicate cue type and reason): deferred           General ADL Comments: attempted to EOB, patient initally agreed but resisted once therapist began to assist her therefore returned back to supine.     Vision Baseline Vision/History: No visual deficits Patient Visual Report: Blurring of vision Additional Comments: pt reports some blurring, finger to nose testing reveals depth perception issues with pt overshooting, unable to focus  to assess tracking or further assessment with mulitple step commands.  Can read name tag but not clock;  initially not seeing clock (on L side of midline) therefore some inattetion? Can scan towards L to follow thearpist.      Perception     Praxis      Pertinent Vitals/Pain Pain Assessment: Faces Faces Pain Scale: Hurts little more Pain Location: generalized, L LE Pain Descriptors / Indicators: Discomfort;Grimacing;Guarding Pain Intervention(s): Limited activity within patient's tolerance;Monitored during session;Repositioned     Hand Dominance Right   Extremity/Trunk Assessment Upper Extremity Assessment Upper Extremity Assessment: LUE deficits/detail;Generalized weakness LUE Deficits / Details: L UE grossly 3+/5 MMT (compared to 4/5 R side) LUE Coordination: decreased fine motor;decreased gross motor   Lower Extremity Assessment Lower Extremity Assessment: Defer to PT evaluation LLE Deficits / Details: Noted laceration on shin and bruising on shin and foot. Unable to formally test strength due to poor sitting balance, impulsivity to try and stand up, and diffculty following commands.   Cervical / Trunk Assessment Cervical / Trunk Assessment: Kyphotic;Other exceptions Cervical / Trunk Exceptions: Forward head posture with rounded shoulders.   Communication Communication Communication: Expressive difficulties   Cognition Arousal/Alertness: Awake/alert Behavior During Therapy: Flat affect Overall Cognitive Status: Impaired/Different from baseline Area of Impairment: Orientation;Attention;Memory;Following commands;Awareness;Safety/judgement;Problem solving                 Orientation Level: Disoriented to;Situation;Time Current Attention Level: Sustained Memory: Decreased short-term memory Following Commands: Follows one step commands inconsistently;Follows one step commands with increased time Safety/Judgement: Decreased awareness of safety;Decreased awareness of deficits Awareness: Intellectual Problem Solving: Slow processing;Decreased initiation;Requires verbal cues;Difficulty sequencing;Requires tactile cues General Comments: Patient able to report "hospital" and [place], initally  reports 1933 as year but able to choose correctly when given 2 choices.  Pt requires increased time to follow simple 1 step commands, looses attention to task and requires redirection.   General Comments       Exercises     Shoulder Instructions      Home Living Family/patient expects to be discharged to:: Private residence Living Arrangements: Alone;Children Available Help at Discharge: Family Type of Home: Apartment Home Access: Level entry (1 step up)     Home Layout: One level     Bathroom Shower/Tub: Teacher, early years/pre: Standard     Home Equipment: Bedside commode;Shower seat;Hand held shower head;Grab bars - tub/shower;Cane - single point   Additional Comments: Son checks in on her - sees her in the morning and night. He was working remotely but is now back at work and unable to provide 24 hour assist.  Lives With: Son    Prior Functioning/Environment Level of Independence: Independent with assistive device(s)        Comments: Washes hair in the kitchen sink, and sits side of tub to wash. Son states she has a formal shower about once a month. Son does most of the cooking. Pt does not drive. She goes out to the store 1x/week with son/daughter.        OT Problem List: Decreased strength;Decreased activity tolerance;Impaired balance (sitting and/or standing);Decreased coordination;Decreased cognition;Impaired vision/perception;Decreased safety awareness;Decreased knowledge of use of DME or AE;Decreased knowledge of precautions;Pain      OT Treatment/Interventions: Self-care/ADL training;Neuromuscular education;DME and/or AE instruction;Therapeutic activities;Cognitive remediation/compensation;Visual/perceptual remediation/compensation;Patient/family education;Balance training    OT Goals(Current goals can be found in the care plan section) Acute Rehab OT Goals Patient Stated Goal: Pt did not state goals. Time For Goal Achievement: 08/01/20 Potential  to Achieve Goals: Good  OT Frequency: Min 2X/week   Barriers to D/C:  Co-evaluation              AM-PAC OT "6 Clicks" Daily Activity     Outcome Measure Help from another person eating meals?: A Little Help from another person taking care of personal grooming?: A Little Help from another person toileting, which includes using toliet, bedpan, or urinal?: Total Help from another person bathing (including washing, rinsing, drying)?: A Lot Help from another person to put on and taking off regular upper body clothing?: A Little Help from another person to put on and taking off regular lower body clothing?: Total 6 Click Score: 13   End of Session Nurse Communication: Mobility status  Activity Tolerance: Patient limited by fatigue Patient left: in bed;with call bell/phone within reach;with bed alarm set  OT Visit Diagnosis: Other abnormalities of gait and mobility (R26.89);Muscle weakness (generalized) (M62.81);Pain;Other symptoms and signs involving the nervous system (R29.898);Other symptoms and signs involving cognitive function Pain - Right/Left: Left Pain - part of body: Leg                Time: 1430-1446 OT Time Calculation (min): 16 min Charges:  OT General Charges $OT Visit: 1 Visit OT Evaluation $OT Eval Moderate Complexity: 1 Mod  Monica Ward, OT Acute Rehabilitation Services Pager [phone] Office [phone]   Delight Stare 07/18/2020, 3:52 PM

## 2020-07-19 DIAGNOSIS — I63411 Cerebral infarction due to embolism of right middle cerebral artery: Principal | ICD-10-CM

## 2020-07-19 DIAGNOSIS — I480 Paroxysmal atrial fibrillation: Secondary | ICD-10-CM | POA: Diagnosis not present

## 2020-07-19 DIAGNOSIS — I63511 Cerebral infarction due to unspecified occlusion or stenosis of right middle cerebral artery: Secondary | ICD-10-CM | POA: Diagnosis not present

## 2020-07-19 DIAGNOSIS — W19XXXA Unspecified fall, initial encounter: Secondary | ICD-10-CM

## 2020-07-19 LAB — URINE CULTURE: Culture: >=100000 — AB

## 2020-07-19 LAB — TSH: TSH: 2.386 u[IU]/mL (ref 0.350–4.500)

## 2020-07-19 LAB — SARS CORONAVIRUS 2 (TAT 6-24 HRS): SARS Coronavirus 2: NEGATIVE

## 2020-07-19 LAB — MAGNESIUM: Magnesium: 2.1 mg/dL (ref 1.7–2.4)

## 2020-07-19 MED ORDER — CEPHALEXIN 500 MG PO CAPS
500.0000 mg | ORAL_CAPSULE | Freq: Two times a day (BID) | ORAL | Status: DC
  Administered 2020-07-19 – 2020-07-20 (×3): 500 mg via ORAL
  Filled 2020-07-19 (×3): qty 1

## 2020-07-19 MED ORDER — HALOPERIDOL LACTATE 5 MG/ML IJ SOLN
3.0000 mg | Freq: Four times a day (QID) | INTRAMUSCULAR | Status: DC | PRN
  Administered 2020-07-19: 3 mg via INTRAVENOUS
  Filled 2020-07-19 (×2): qty 1

## 2020-07-19 MED ORDER — APIXABAN 2.5 MG PO TABS
2.5000 mg | ORAL_TABLET | Freq: Two times a day (BID) | ORAL | Status: DC
  Administered 2020-07-20: 2.5 mg via ORAL
  Filled 2020-07-19: qty 1

## 2020-07-19 NOTE — Consult Note (Addendum)
Cardiology Consultation:   Patient ID: Monica Ward MRN: [id number]; DOB: 18-Oct-1929  Admit date: 07/17/2020 Date of Consult: 07/19/2020  PCP:  Horald Pollen, [place]  Cardiologist:  New - Dr. Margaretann Loveless (Previously seen by Dr. Percival Spanish in 2013) Electrophysiologist:  None   Patient Profile:   Monica Ward is a 87 y.o. female with a history of atypical chest pain in 2013 felt to be secondary to costochondritis, hyperlipidemia, chronic constipation with diverticulosis, small bowel obstruction, and leiomyosarcoma, who was admitted with acute CVA on 07/17/2020 and then was noted to go into atrial fibrillation. Cardiology consulted for further evaluation of atrial fibrillation at the request of Dr. Kai Levins.  History of Present Illness:   Monica Ward is a 87 year old female with the above history. Previously seen by Dr. Percival Spanish in 2013 following hospitalization for chest pain. She ruled out for MI and EKG showed no evidence of ischemia or infarct. Chest pain felt to be due secondary costochondritis. Symptoms completely resolved by time of visit with Dr. Percival Spanish. She was also noted to have some bradycardia during hospitalization but this occurred while sleeping. No further work-up was felt to be needed. Patient has not been seen by Cardiology since that time.  Patient presented to the ED via EMS on 07/17/2020 after being found on the floor by her son. Son also reported some left sided weakness and slurred speech. She was confused and agitated on arrival to the ED. Head CT showed no acute findings but did shows cortical atrophy and chronic microvascular ischemic changes. Brain MRI showed medium sized region of patchy DWI signal abnormality in the right MCA territory most consistent with an acute stroke. Neck CTA showed no significant stenosis. Neurology started Plavix. Echo showed LVEF of 60-65% with normal wall motion and grade 2 diastolic dysfunction.  Initial plan was for placement of loop recorder prior to discharge to rule out atrial fibrillation; however, patient noted to be in atrial fibrillation last night. Cardiology consulted for further evaluation.  At the time of this evaluation, patient resting comfortably in not acute distress. She is still confused. Son assisted with history. She has had no cardiac problems prior to admission. No chest pain, shortness of breath, orthopnea, PND, palpitations, lightheadedness, dizziness, syncope. She does have some mild left lower extremity edema since she had a infection on this leg in 03/2020. She lives with her son but is very independent. She dresses and bathes herself and does her own laundry. She ambulates with a cane but is steady and careful ambulating. No falls prior to stroke. Son thinks she fell and landed on her left sided near the bathtub because she has large ecchymosis on left arm and left leg. She does bruise easily but no abnormal bleeding in urine or stools. No recent fevers or illnesses. She has chronic constipation.  Past Medical History:  Diagnosis Date  . Arthritis    "probably" (01/28/2012)  . Cancer ([place]) Around 2002-2003   Unclear history of "7 lb" abdominal tumor treated at [facility] ~2003 s/p open abdominal colonic resection and radiation  . Complication of anesthesia    "trouble waking up; too much med given" (01/28/2012)  . Constipation 02/03/2014  . Constipation, chronic 02/06/2014  . Diverticulitis    Per chart   . Diverticulosis 02/03/2014  . High cholesterol   . 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 Patient had surgery in 2000 and mass  which was presumed to be a leiomyosarcoma rather than a spindle cell cancer. I have no further records of path reports to give me a good definite path diagnosis of her tumor resection.   . Precordial pain 02/12/2012  . SBO (small bowel obstruction) ([place]) 08/2009   Early, partial SBO improved with  conservative Tx     Past Surgical History:  Procedure Laterality Date  . ABDOMINAL HYSTERECTOMY  ~ 1985   ovaries resected B; DUB.  Marland Kitchen APPENDECTOMY  1968  . CATARACT EXTRACTION, BILATERAL  09/27/2015  . [place]  . COLECTOMY  2003   "cancer"  . TONSILLECTOMY     "when I was a child"  . TOTAL HIP ARTHROPLASTY Left 01/31/2015   Procedure:  ANTERIOR APPROACH  LEFT HEMI ARTHROPLASTY;  Surgeon: Rod Can, MD;  Location: [facility];  Service: Orthopedics;  Laterality: Left;  . TUMOR REMOVAL  1990's;  X 2   "benign"     Home Medications:  Prior to Admission medications   Medication Sig Start Date End Date Taking? Authorizing Provider  aspirin EC 81 MG tablet Take 81 mg by mouth every 4 (four) hours as needed for mild pain.   Yes [provider]    Inpatient Medications: Scheduled Meds: . atorvastatin  80 mg Oral Daily  . bacitracin   Topical BID  . cephALEXin  500 mg Oral Q12H  . enoxaparin (LOVENOX) injection  40 mg Subcutaneous Q24H   Continuous Infusions: . lactated ringers 100 mL/hr at 07/17/20 1351   PRN Meds: acetaminophen **OR** acetaminophen (TYLENOL) oral liquid 160 mg/5 mL **OR** acetaminophen, haloperidol lactate, labetalol, senna-docusate  Allergies:   No Known Allergies  Social History:   Social History   Socioeconomic History  . Marital status: Married    Spouse name: Not on file  . Number of children: 3  . Years of education: Not on file  . Highest education level: Not on file  Occupational History  . Occupation: retired  Tobacco Use  . Smoking status: Never Smoker  . Smokeless tobacco: Never Used  Substance and Sexual Activity  . Alcohol use: Yes    Alcohol/week: 3.0 standard drinks    Types: 3 Glasses of wine per week    Comment: social  . Drug use: No  . Sexual activity: Not Currently  Other Topics Concern  . Not on file  Social History Narrative      Marital status: widowed in 08/2015; married x 60 yyears.       Children: 3 children in area; 4 grandchildren; no gg.      Lives: alone in [place].       Employment: Retired      Tobacco: smoked briefly in college      Alcohol: 1-2 glass of wine per week.      Exercise:  Very little in 2018; cleans house weekly; takes care garden in back and flowers.      ADLs: performs all ADLs; still drives.  No assistant devices.  Cleans townhome.  Does own grocery shopping.  Pay bills.        Advanced Directives:  FULL CODE; no prolonged measures.  HCPOA: son/David Slatter         Social Determinants of Health   Financial Resource Strain: Not on file  Food Insecurity: Not on file  Transportation Needs: Not on file  Physical Activity: Not on file  Stress: Not on file  Social Connections: Not on file  Intimate Partner Violence: Not  on file    Family History:    Family History  Problem Relation Age of Onset  . Heart failure Father 79       Heart failure  . Heart disease Father      ROS:  Please see the history of present illness.  All other ROS reviewed and negative.     Physical Exam/Data:   Vitals:   07/18/20 2317 07/19/20 0429 07/19/20 0724 07/19/20 1136  BP: (!) 132/91 127/84 (!) 136/95 (!) 149/72  Pulse: (!) 101 86 91 60  Resp: 18 18 18 18   Temp: 97.8 F (36.6 C) 97.9 F (36.6 C) 97.7 F (36.5 C) 97.7 F (36.5 C)  TempSrc: Oral Oral Oral Oral  SpO2: 98% 96% 97% 99%  Weight:      Height:        Intake/Output Summary (Last 24 hours) at 07/19/2020 1530 Last data filed at 07/19/2020 0815 Gross per 24 hour  Intake 200 ml  Output 900 ml  Net -700 ml   Last 3 Weights 07/17/2020 12/28/2019 05/02/2019  Weight (lbs) 108 lb 14.5 oz 105 lb 110 lb  Weight (kg) 49.4 kg 47.628 kg 49.896 kg     Body mass index is 18.69 kg/m.  General: 87 y.o. frail Caucsian female resting comfortably in no acute distress.  HEENT: Normocephalic and atraumatic. Sclera clear.  Neck: Supple. No carotid bruits. No JVD. Heart: Tachycardic with irregularly irregular  rhythm. No murmurs, gallops, or rubs. Radial pulses 2+ and equal bilaterally. Lungs: No increased work of breathing. Clear to ausculation bilaterally. No wheezes, rhonchi, or rales.  Abdomen: Soft, non-distended, and non-tender to palpation. Bowel sounds present. MSK: Normal strength and tone for age. Extremities: Left lower extremity edema.    Skin: Warm and dry. Large ecchymosis on left upper and lower extremities from falls. Neuro: Awake and alert but still confused following stroke. Still has some residual left sided weakness and slurred speech. Psych: Normal affect.    EKG:  The EKG was personally reviewed and demonstrates: Atrial fibrillation, rate 104 bpm, with LAFB and non-specific ST/T changes.   Telemetry:  Telemetry was personally reviewed and demonstrates:  In and out of atrial fibrillation. When in atrial fibrillation, rates in the 90's to 120's (briefly in the 140's). When in sinus rhythm, rates in the 60's to 80's.  Relevant CV Studies:  Echocardiogram 07/18/2020: Impressions: 1. Left ventricular ejection fraction, by estimation, is 60 to 65%. The  left ventricle has normal function. The left ventricle has no regional  wall motion abnormalities. Left ventricular diastolic parameters are  consistent with Grade II diastolic  dysfunction (pseudonormalization). Elevated left atrial pressure.  2. Right ventricular systolic function is normal. The right ventricular  size is normal. There is mildly elevated pulmonary artery systolic  pressure. The estimated right ventricular systolic pressure is 38.7 mmHg.  3. Left atrial size was moderately dilated.  4. The mitral valve is normal in structure. No evidence of mitral valve  regurgitation.  5. The aortic valve is tricuspid. Aortic valve regurgitation is not  visualized. Mild aortic valve sclerosis is present, with no evidence of  aortic valve stenosis.  6. The inferior vena cava is dilated in size with >50% respiratory   variability, suggesting right atrial pressure of 8 mmHg.  _______________  Lower Extremity Dopplers 07/18/2020: Summary: RIGHT:  - There is no evidence of deep vein thrombosis in the lower extremity.  - A cystic structure is found in the popliteal fossa.    LEFT:  -  There is no evidence of deep vein thrombosis in the lower extremity.  - No cystic structure found in the popliteal fossa.    Laboratory Data:  High Sensitivity Troponin:  No results for input(s): TROPONINIHS in the last 720 hours.   Chemistry Recent Labs  Lab 07/17/20 1222  NA 141  K 3.6  CL 107  CO2 26  GLUCOSE 97  BUN 21  CREATININE 0.73  CALCIUM 9.5  GFRNONAA >60  ANIONGAP 8    Recent Labs  Lab 07/17/20 1222  PROT 6.9  ALBUMIN 3.9  AST 23  ALT 16  ALKPHOS 55  BILITOT 0.4   Hematology Recent Labs  Lab 07/17/20 1222  WBC 8.4  RBC 4.76  HGB 14.9  HCT 44.1  MCV 92.6  MCH 31.3  MCHC 33.8  RDW 14.4  PLT 319   BNPNo results for input(s): BNP, PROBNP in the last 168 hours.  DDimer No results for input(s): DDIMER in the last 168 hours.   Radiology/Studies:  CT ANGIO HEAD W OR WO CONTRAST  Result Date: 07/17/2020 CLINICAL DATA:  Infarcts on MRI EXAM: CT ANGIOGRAPHY HEAD AND NECK TECHNIQUE: Multidetector CT imaging of the head and neck was performed using the standard protocol during bolus administration of intravenous contrast. Multiplanar CT image reconstructions and MIPs were obtained to evaluate the vascular anatomy. Carotid stenosis measurements (when applicable) are obtained utilizing NASCET criteria, using the distal internal carotid diameter as the denominator. CONTRAST:  80mL OMNIPAQUE IOHEXOL 350 MG/ML SOLN COMPARISON:  Correlation made with same day brain FINDINGS: CTA NECK Aortic arch: Great vessel origins are patent. Right carotid system: Patent. Mild calcified plaque at the ICA origin causing minimal stenosis. Left carotid system: Patent. Minimal calcified plaque at the ICA origin  without stenosis. Vertebral arteries: Patent and codominant. Skeleton: Degenerative changes of the cervical spine. Other neck: Negative. Upper chest: Included upper lungs are clear. Review of the MIP images confirms the above findings CTA HEAD Anterior circulation: Intracranial internal carotid arteries are patent with mild calcified plaque. Right M1 MCA is patent. There is nearly occlusive thrombus within the proximal posterior division resulting in diminished opacification of distal vessels. Left middle and both anterior cerebral arteries are patent. Posterior circulation: Intracranial vertebral arteries, basilar artery, and posterior cerebral arteries are patent. Left posterior communicating artery is present. Moderate stenosis at the proximal right P2 PCA. Venous sinuses: Patent as allowed by contrast bolus timing. Review of the MIP images confirms the above findings IMPRESSION: Nearly occlusive thrombus within the proximal posterior division of the right MCA. Diminished flow within more distal vessels. Moderate stenosis proximal right P2 PCA. No significant stenosis in the neck. Primary result called by telephone at the time of interpretation on 07/17/2020 at 8:40 pm to provider Pecolia Ades, who verbally acknowledged these results. Electronically Signed   By: Macy Mis M.D.   On: 07/17/2020 20:46   DG Pelvis 1-2 Views  Result Date: 07/17/2020 CLINICAL DATA:  Fall. EXAM: PELVIS - 1-2 VIEW COMPARISON:  January 31, 2015. FINDINGS: Status post left hip arthroplasty. No fracture or dislocation is noted. Mild degenerative changes seen involving the right hip. IMPRESSION: No acute abnormality seen in the pelvis. Electronically Signed   By: Marijo Conception M.D.   On: 07/17/2020 13:58   DG Ankle Complete Left  Result Date: 07/17/2020 CLINICAL DATA:  Fall. EXAM: LEFT ANKLE COMPLETE - 3+ VIEW COMPARISON:  None. FINDINGS: There is no evidence of fracture, dislocation, or joint effusion. There is no evidence  of arthropathy or other focal bone abnormality. Soft tissues are unremarkable. IMPRESSION: Negative. Electronically Signed   By: Marijo Conception M.D.   On: 07/17/2020 14:01   DG Ankle Complete Right  Result Date: 07/17/2020 CLINICAL DATA:  Fall. EXAM: RIGHT ANKLE - COMPLETE 3+ VIEW COMPARISON:  None. FINDINGS: There is no evidence of fracture, dislocation, or joint effusion. There is no evidence of arthropathy or other focal bone abnormality. Soft tissues are unremarkable. IMPRESSION: Negative. Electronically Signed   By: Marijo Conception M.D.   On: 07/17/2020 14:01   DG Abdomen 1 View  Result Date: 07/17/2020 CLINICAL DATA:  Fall. EXAM: ABDOMEN - 1 VIEW COMPARISON:  February 04, 2014. FINDINGS: The bowel gas pattern is normal. No radio-opaque calculi or other significant radiographic abnormality are seen. Surgical clips are seen in the pelvis. IMPRESSION: No evidence of bowel obstruction or ileus. Aortic Atherosclerosis (ICD10-I70.0). Electronically Signed   By: Marijo Conception M.D.   On: 07/17/2020 14:00   CT Head Wo Contrast  Result Date: 07/17/2020 CLINICAL DATA:  Status post fall.  Initial encounter. EXAM: CT HEAD WITHOUT CONTRAST CT CERVICAL SPINE WITHOUT CONTRAST TECHNIQUE: Multidetector CT imaging of the head and cervical spine was performed following the standard protocol without intravenous contrast. Multiplanar CT image reconstructions of the cervical spine were also generated. COMPARISON:  None. FINDINGS: CT HEAD FINDINGS Brain: No evidence of acute infarction, hemorrhage, hydrocephalus, extra-axial collection or mass lesion/mass effect. Atrophy and chronic microvascular ischemic change noted. Vascular: No hyperdense vessel or unexpected calcification. Skull: No fracture or focal lesion. Sinuses/Orbits: Motion limits evaluation. No abnormality is seen. The patient is status post cataract surgery Other: None. CT CERVICAL SPINE FINDINGS Alignment: Trace anterolisthesis C3 on C4. Straightening of  lordosis. Skull base and vertebrae: No acute fracture. No primary bone lesion or focal pathologic process. Soft tissues and spinal canal: No prevertebral fluid or swelling. No visible canal hematoma. Disc levels: Mild loss of disc space height and endplate spurring are seen at C5-6. Upper chest: Clear. Other: None. IMPRESSION: No acute abnormality head or cervical spine. Cortical atrophy and chronic microvascular ischemic change. Mild degenerative disc disease C5-6. Electronically Signed   By: Inge Rise M.D.   On: 07/17/2020 14:42   CT ANGIO NECK W OR WO CONTRAST  Result Date: 07/17/2020 CLINICAL DATA:  Infarcts on MRI EXAM: CT ANGIOGRAPHY HEAD AND NECK TECHNIQUE: Multidetector CT imaging of the head and neck was performed using the standard protocol during bolus administration of intravenous contrast. Multiplanar CT image reconstructions and MIPs were obtained to evaluate the vascular anatomy. Carotid stenosis measurements (when applicable) are obtained utilizing NASCET criteria, using the distal internal carotid diameter as the denominator. CONTRAST:  14mL OMNIPAQUE IOHEXOL 350 MG/ML SOLN COMPARISON:  Correlation made with same day brain FINDINGS: CTA NECK Aortic arch: Great vessel origins are patent. Right carotid system: Patent. Mild calcified plaque at the ICA origin causing minimal stenosis. Left carotid system: Patent. Minimal calcified plaque at the ICA origin without stenosis. Vertebral arteries: Patent and codominant. Skeleton: Degenerative changes of the cervical spine. Other neck: Negative. Upper chest: Included upper lungs are clear. Review of the MIP images confirms the above findings CTA HEAD Anterior circulation: Intracranial internal carotid arteries are patent with mild calcified plaque. Right M1 MCA is patent. There is nearly occlusive thrombus within the proximal posterior division resulting in diminished opacification of distal vessels. Left middle and both anterior cerebral arteries  are patent. Posterior circulation: Intracranial vertebral arteries, basilar artery, and posterior  cerebral arteries are patent. Left posterior communicating artery is present. Moderate stenosis at the proximal right P2 PCA. Venous sinuses: Patent as allowed by contrast bolus timing. Review of the MIP images confirms the above findings IMPRESSION: Nearly occlusive thrombus within the proximal posterior division of the right MCA. Diminished flow within more distal vessels. Moderate stenosis proximal right P2 PCA. No significant stenosis in the neck. Primary result called by telephone at the time of interpretation on 07/17/2020 at 8:40 pm to provider Pecolia Ades, who verbally acknowledged these results. Electronically Signed   By: Macy Mis M.D.   On: 07/17/2020 20:46   CT Cervical Spine Wo Contrast  Result Date: 07/17/2020 CLINICAL DATA:  Status post fall.  Initial encounter. EXAM: CT HEAD WITHOUT CONTRAST CT CERVICAL SPINE WITHOUT CONTRAST TECHNIQUE: Multidetector CT imaging of the head and cervical spine was performed following the standard protocol without intravenous contrast. Multiplanar CT image reconstructions of the cervical spine were also generated. COMPARISON:  None. FINDINGS: CT HEAD FINDINGS Brain: No evidence of acute infarction, hemorrhage, hydrocephalus, extra-axial collection or mass lesion/mass effect. Atrophy and chronic microvascular ischemic change noted. Vascular: No hyperdense vessel or unexpected calcification. Skull: No fracture or focal lesion. Sinuses/Orbits: Motion limits evaluation. No abnormality is seen. The patient is status post cataract surgery Other: None. CT CERVICAL SPINE FINDINGS Alignment: Trace anterolisthesis C3 on C4. Straightening of lordosis. Skull base and vertebrae: No acute fracture. No primary bone lesion or focal pathologic process. Soft tissues and spinal canal: No prevertebral fluid or swelling. No visible canal hematoma. Disc levels: Mild loss of disc  space height and endplate spurring are seen at C5-6. Upper chest: Clear. Other: None. IMPRESSION: No acute abnormality head or cervical spine. Cortical atrophy and chronic microvascular ischemic change. Mild degenerative disc disease C5-6. Electronically Signed   By: Inge Rise M.D.   On: 07/17/2020 14:42   MR BRAIN WO CONTRAST  Result Date: 07/17/2020 CLINICAL DATA:  Unwitnessed fall, questionable left arm/grip weakness EXAM: MRI HEAD WITHOUT CONTRAST TECHNIQUE: Multiplanar, multiecho pulse sequences of the brain and surrounding structures were obtained without intravenous contrast. COMPARISON:  None. FINDINGS: Motion degraded DWI and sagittal T1 sequences were obtained. Patient could not tolerate remainder of the study. There is patchy diffusion hyperintensity in the right frontal, parietal, and temporal lobes. IMPRESSION: Two sequences performed with significant motion degradation. Likely acute to subacute right MCA territory infarcts. Electronically Signed   By: Macy Mis M.D.   On: 07/17/2020 17:35   ECHOCARDIOGRAM COMPLETE  Result Date: 07/18/2020    ECHOCARDIOGRAM REPORT   Patient Name:   LASHENA SIGNER [facility] Date of Exam: 07/18/2020 Medical Rec #:  [id number]          Height:       64.0 in Accession #:    [id number]         Weight:       108.9 lb Date of Birth:  Oct 09, 1929           BSA:          1.511 m Patient Age:    59 years           BP:           152/73 mmHg Patient Gender: F                  HR:           57 bpm. Exam Location:  Inpatient Procedure: 2D Echo Indications:    Stroke I63.9  History:        Patient has no prior history of Echocardiogram examinations.  Sonographer:    Mikki Santee RDCS (AE) Referring Phys: [id number] [place]  1. Left ventricular ejection fraction, by estimation, is 60 to 65%. The left ventricle has normal function. The left ventricle has no regional wall motion abnormalities. Left ventricular diastolic parameters are consistent with  Grade II diastolic dysfunction (pseudonormalization). Elevated left atrial pressure.  2. Right ventricular systolic function is normal. The right ventricular size is normal. There is mildly elevated pulmonary artery systolic pressure. The estimated right ventricular systolic pressure is 00.8 mmHg.  3. Left atrial size was moderately dilated.  4. The mitral valve is normal in structure. No evidence of mitral valve regurgitation.  5. The aortic valve is tricuspid. Aortic valve regurgitation is not visualized. Mild aortic valve sclerosis is present, with no evidence of aortic valve stenosis.  6. The inferior vena cava is dilated in size with >50% respiratory variability, suggesting right atrial pressure of 8 mmHg. FINDINGS  Left Ventricle: Left ventricular ejection fraction, by estimation, is 60 to 65%. The left ventricle has normal function. The left ventricle has no regional wall motion abnormalities. The left ventricular internal cavity size was normal in size. There is  no left ventricular hypertrophy. Left ventricular diastolic parameters are consistent with Grade II diastolic dysfunction (pseudonormalization). Elevated left atrial pressure. Right Ventricle: The right ventricular size is normal. No increase in right ventricular wall thickness. Right ventricular systolic function is normal. There is mildly elevated pulmonary artery systolic pressure. The tricuspid regurgitant velocity is 2.70  m/s, and with an assumed right atrial pressure of 8 mmHg, the estimated right ventricular systolic pressure is 67.6 mmHg. Left Atrium: Left atrial size was moderately dilated. Right Atrium: Right atrial size was normal in size. Pericardium: There is no evidence of pericardial effusion. Mitral Valve: The mitral valve is normal in structure. No evidence of mitral valve regurgitation. Tricuspid Valve: The tricuspid valve is normal in structure. Tricuspid valve regurgitation is trivial. Aortic Valve: The aortic valve is  tricuspid. Aortic valve regurgitation is not visualized. Mild aortic valve sclerosis is present, with no evidence of aortic valve stenosis. Pulmonic Valve: The pulmonic valve was not well visualized. Pulmonic valve regurgitation is trivial. Aorta: The aortic root is normal in size and structure. Venous: The inferior vena cava is dilated in size with greater than 50% respiratory variability, suggesting right atrial pressure of 8 mmHg. IAS/Shunts: The interatrial septum was not well visualized.  LEFT VENTRICLE PLAX 2D LVIDd:         4.20 cm  Diastology LVIDs:         3.10 cm  LV e' medial:    6.20 cm/s LV PW:         0.80 cm  LV E/e' medial:  19.5 LV IVS:        0.90 cm  LV e' lateral:   7.51 cm/s LVOT diam:     2.00 cm  LV E/e' lateral: 16.1 LV SV:         84 LV SV Index:   56 LVOT Area:     3.14 cm  RIGHT VENTRICLE RV S prime:     12.80 cm/s TAPSE (M-mode): 2.6 cm LEFT ATRIUM             Index       RIGHT ATRIUM          Index LA diam:  3.30 cm 2.18 cm/m  RA Area:     7.71 cm LA Vol (A2C):   65.8 ml 43.55 ml/m RA Volume:   14.40 ml 9.53 ml/m LA Vol (A4C):   67.5 ml 44.67 ml/m LA Biplane Vol: 73.1 ml 48.38 ml/m  AORTIC VALVE LVOT Vmax:   105.00 cm/s LVOT Vmean:  70.100 cm/s LVOT VTI:    0.268 m  AORTA Ao Root diam: 3.30 cm MITRAL VALVE                TRICUSPID VALVE MV Area (PHT): 2.16 cm     TR Peak grad:   29.2 mmHg MV Decel Time: 352 msec     TR Vmax:        270.00 cm/s MV E velocity: 121.00 cm/s MV A velocity: 101.00 cm/s  SHUNTS MV E/A ratio:  1.20         Systemic VTI:  0.27 m                             Systemic Diam: 2.00 cm Oswaldo Milian MD Electronically signed by Oswaldo Milian MD Signature Date/Time: 07/18/2020/3:25:17 PM    Final    VAS [place] LOWER EXTREMITY VENOUS (DVT)  Result Date: 07/18/2020  Lower Venous DVT Study Indications: Embolic stroke in cancer patient.  Comparison Study: No prior studies. Performing Technologist: Darlin Coco RDMS,RVT  Examination Guidelines: A  complete evaluation includes B-mode imaging, spectral Doppler, color Doppler, and power Doppler as needed of all accessible portions of each vessel. Bilateral testing is considered an integral part of a complete examination. Limited examinations for reoccurring indications may be performed as noted. The reflux portion of the exam is performed with the patient in reverse Trendelenburg.  +---------+---------------+---------+-----------+----------+--------------+ RIGHT    CompressibilityPhasicitySpontaneityPropertiesThrombus Aging +---------+---------------+---------+-----------+----------+--------------+ CFV      Full           Yes      Yes                                 +---------+---------------+---------+-----------+----------+--------------+ SFJ      Full                                                        +---------+---------------+---------+-----------+----------+--------------+ FV Prox  Full                                                        +---------+---------------+---------+-----------+----------+--------------+ FV Mid   Full                                                        +---------+---------------+---------+-----------+----------+--------------+ FV DistalFull                                                        +---------+---------------+---------+-----------+----------+--------------+  PFV      Full                                                        +---------+---------------+---------+-----------+----------+--------------+ POP      Full           Yes      Yes                                 +---------+---------------+---------+-----------+----------+--------------+ PTV      Full                                                        +---------+---------------+---------+-----------+----------+--------------+ PERO     Full                                                         +---------+---------------+---------+-----------+----------+--------------+   +---------+---------------+---------+-----------+----------+--------------+ LEFT     CompressibilityPhasicitySpontaneityPropertiesThrombus Aging +---------+---------------+---------+-----------+----------+--------------+ CFV      Full           Yes      Yes                                 +---------+---------------+---------+-----------+----------+--------------+ SFJ      Full                                                        +---------+---------------+---------+-----------+----------+--------------+ FV Prox  Full                                                        +---------+---------------+---------+-----------+----------+--------------+ FV Mid   Full                                                        +---------+---------------+---------+-----------+----------+--------------+ FV DistalFull                                                        +---------+---------------+---------+-----------+----------+--------------+ PFV      Full                                                        +---------+---------------+---------+-----------+----------+--------------+  POP      Full           Yes      Yes                                 +---------+---------------+---------+-----------+----------+--------------+ PTV      Full                                                        +---------+---------------+---------+-----------+----------+--------------+ PERO     Full                                                        +---------+---------------+---------+-----------+----------+--------------+     Summary: RIGHT: - There is no evidence of deep vein thrombosis in the lower extremity.  - A cystic structure is found in the popliteal fossa.  LEFT: - There is no evidence of deep vein thrombosis in the lower extremity.  - No cystic structure found in the popliteal  fossa.  *See table(s) above for measurements and observations. Electronically signed by Curt Jews MD on 07/18/2020 at 2:02:53 PM.    Final      Assessment and Plan:   New Onset Paroxysmal Atrial Fibrillation - Patient admitted with acute CVA and then found to be in atrial fibrillation yesterday. Patient going in and out of atrial fibrillation. When in atrial fibrillates, rates in the 90's to 110's. When in sinus rhythm, rates in the 60's to 80's. - Potassium 3.6 on admission. - Will recheck Magnesium and TSH. - Echo showed normal LV function. - Rates mildly elevated. If rates remain >100, would add Lopressor 12.5mg  twice daily. However, will hold off for now to allow for permissive hypertension in setting of acute stroke. - CHA2DS2-VASc = 6 (HTN, stroke x2, age x2, female). Recommended starting Eliquis 2.5mg  twice daily (reduced dose given age >63 and weight <60kg) if OK with Neurology.  Hypertension - No official diagnosis of this prior to admission. BP reasonable well controlled. - See recommendations for low dose beta-blocker above.  Hyperlipidemia - Lipid panel this admission: Total Cholesterol 243, Triglycerides 76, HDL 68, LDL 160.  - LDL goal <70 given CAD. - Started on Lipitor 40mg  daily this admission. - Will need repeat lipid panel and LFTs in 6-8 weeks.  Acute CVA - Brain MRI consistent with acute infarction of right MCA. - Started on DAPT with Aspirin 325mg  daily and Plavix 75mg  daily with plans to continue this for 3 months and then transition to Plavix alone. Now that patient has been found to have atrial fibrillation and will need to be anticoagulated, antiplatelet recommendations may change. However, will defer this to Neurology. - Management per primary team.  Possible Acute UTI - Empirically on Rocephin. - Management per primary team.   Risk Assessment/Risk Scores:   CHA2DS2-VASc Score = 6  This indicates a 9.7% annual risk of stroke. The patient's score is  based upon: CHF History: No HTN History: Yes Diabetes History: No Stroke History: Yes Vascular Disease History: No Age Score: 2 Gender Score: 1   For questions or updates, please contact [facility] Please consult [email or web address] for contact info under    Signed, Darreld Mclean, PA-C  07/19/2020 3:30 PM  Patient seen and examined with Sande Rives, PA-C.  Agree as above, with the following exceptions and changes as noted below.  This is an 87 year old female currently admitted for right MCA stroke with embolic pattern and newly diagnosed atrial fibrillation.  Was originally intended to have a loop recorder placed tomorrow, however was found to be in atrial fibrillation on telemetry overnight.  Her presentation was with agitation and confusion after being found on the floor by her son at 10 AM on day of presentation.  Gen: NAD, CV: RRR, no murmurs, Lungs: clear, Abd: soft, Extrem: Warm, well perfused, no edema, Neuro/Psych: alert and oriented x 3, normal mood and affect. All available labs, radiology testing, previous records reviewed.  Patient is seen eating dinner independently on my exam.  Smiling and conversant.  Her CHA2DS2-VASc score is 6, warranting anticoagulation for atrial fibrillation in the setting of admission for stroke.  Timing of Eliquis can be determined by the neurology team, agree with reduced dosing given weight, age.  She is currently on aspirin 325 mg daily and Plavix 75 mg daily.  From neurology note review, it appears transition to Eliquis tomorrow.  This seems very reasonable.  No other cardiac symptoms at this time.    Heart rates mildly elevated.  If rate control is needed, would start metoprolol tartrate 12.5 mg twice daily, and avoid hypotension from a neurologic perspective.  We will arrange follow-up in cardiology for atrial fibrillation prior to hospital discharge.  Elouise Munroe, MD 07/19/20 5:35 PM

## 2020-07-19 NOTE — Progress Notes (Signed)
[facility] PROGRESS NOTE    Progress Note  Monica Ward  KTG:[id number] DOB: 11-Jun-1929 DOA: 07/17/2020 PCP: Horald Pollen, MD     Brief Narrative:   Monica Ward is an 87 y.o. female with past medical history of hyperlipidemia, small bowel obstruction leiomyosarcoma presents to the ED for evaluation after she was found down at home most of the history was obtained from the ED note in sinus the patient cannot provide history.  Was found on the day of admission in the bathroom was transferred to the ED was found lethargic and with a dysarthric speech, was found to have an acute to subacute right MCA CVA  Significant studies: 07/17/2020 CT of the head showed no acute abnormalities. 07/17/2020 MRI of the brain showed an acute right MCA infarct 07/17/2020 showed nearly occlusive thrombus within the proximal posterior division of the right MCA  Antibiotics: None  Microbiology data: Blood culture:  Procedures: None  Assessment/Plan:   Acute ischemic right MCA stroke [facility]): HgbA1c 5.6, fasting lipid panel HDL > 40, LDL > 150 MRI, MRA of the brain without contrast  as above PT physical therapy recommended skilled nursing facility versus 24-hour supervision, speech. transthoracic Echo,   EF of 42% grade 1 diastolic heart failure. Start patient on ASA 81mg  daily and plavix 75mg  daily  Atorvastatin 80  BP goal: permissive HTN upto 220/120 mmHg Telemetry monitoring has shown no events NG tube placed. Lower extremity Doppler was negative for DVT. A loop recorder to be placed on 07/20/2020.  Possible acute lower UTI: As per family started empirically on Rocephin urine cultures are pending.    DVT prophylaxis: lovenox Family Communication:son Status is: Observation  The patient will require care spanning > 2 midnights and should be moved to inpatient because: Hemodynamically unstable  Dispo: The patient is from: Home              Anticipated d/c is to:  SNF              Patient currently is not medically stable to d/c.   Difficult to place patient No  Code Status:     Code Status Orders  (From admission, onward)         Start     Ordered   07/17/20 2227  Full code  Continuous        07/17/20 2227        Code Status History    Date Active Date Inactive Code Status Order ID Comments User Context   01/31/2015 2011 02/02/2015 1940 Full Code [id number]  Janece Canterbury, MD Inpatient   02/03/2014 2001 02/06/2014 1826 Full Code [id number]  Louellen Molder, MD Inpatient   01/28/2012 1710 01/29/2012 1614 Full Code [id number]  Alfonzo Feller, RN Inpatient   08/18/2011 0124 08/19/2011 1534 Full Code [id number]  Roosvelt Harps, RN Inpatient   Advance Care Planning Activity        IV Access:    Peripheral IV   Procedures and diagnostic studies:   CT ANGIO HEAD W OR WO CONTRAST  Result Date: 07/17/2020 CLINICAL DATA:  Infarcts on MRI EXAM: CT ANGIOGRAPHY HEAD AND NECK TECHNIQUE: Multidetector CT imaging of the head and neck was performed using the standard protocol during bolus administration of intravenous contrast. Multiplanar CT image reconstructions and MIPs were obtained to evaluate the vascular anatomy. Carotid stenosis measurements (when applicable) are obtained utilizing NASCET criteria, using the distal internal carotid diameter as the denominator. CONTRAST:  76mL OMNIPAQUE IOHEXOL 350 MG/ML  SOLN COMPARISON:  Correlation made with same day brain FINDINGS: CTA NECK Aortic arch: Great vessel origins are patent. Right carotid system: Patent. Mild calcified plaque at the ICA origin causing minimal stenosis. Left carotid system: Patent. Minimal calcified plaque at the ICA origin without stenosis. Vertebral arteries: Patent and codominant. Skeleton: Degenerative changes of the cervical spine. Other neck: Negative. Upper chest: Included upper lungs are clear. Review of the MIP images confirms the above findings CTA HEAD Anterior  circulation: Intracranial internal carotid arteries are patent with mild calcified plaque. Right M1 MCA is patent. There is nearly occlusive thrombus within the proximal posterior division resulting in diminished opacification of distal vessels. Left middle and both anterior cerebral arteries are patent. Posterior circulation: Intracranial vertebral arteries, basilar artery, and posterior cerebral arteries are patent. Left posterior communicating artery is present. Moderate stenosis at the proximal right P2 PCA. Venous sinuses: Patent as allowed by contrast bolus timing. Review of the MIP images confirms the above findings IMPRESSION: Nearly occlusive thrombus within the proximal posterior division of the right MCA. Diminished flow within more distal vessels. Moderate stenosis proximal right P2 PCA. No significant stenosis in the neck. Primary result called by telephone at the time of interpretation on 07/17/2020 at 8:40 pm to provider Pecolia Ades, who verbally acknowledged these results. Electronically Signed   By: Macy Mis M.D.   On: 07/17/2020 20:46   DG Pelvis 1-2 Views  Result Date: 07/17/2020 CLINICAL DATA:  Fall. EXAM: PELVIS - 1-2 VIEW COMPARISON:  January 31, 2015. FINDINGS: Status post left hip arthroplasty. No fracture or dislocation is noted. Mild degenerative changes seen involving the right hip. IMPRESSION: No acute abnormality seen in the pelvis. Electronically Signed   By: Marijo Conception M.D.   On: 07/17/2020 13:58   DG Ankle Complete Left  Result Date: 07/17/2020 CLINICAL DATA:  Fall. EXAM: LEFT ANKLE COMPLETE - 3+ VIEW COMPARISON:  None. FINDINGS: There is no evidence of fracture, dislocation, or joint effusion. There is no evidence of arthropathy or other focal bone abnormality. Soft tissues are unremarkable. IMPRESSION: Negative. Electronically Signed   By: Marijo Conception M.D.   On: 07/17/2020 14:01   DG Ankle Complete Right  Result Date: 07/17/2020 CLINICAL DATA:  Fall.  EXAM: RIGHT ANKLE - COMPLETE 3+ VIEW COMPARISON:  None. FINDINGS: There is no evidence of fracture, dislocation, or joint effusion. There is no evidence of arthropathy or other focal bone abnormality. Soft tissues are unremarkable. IMPRESSION: Negative. Electronically Signed   By: Marijo Conception M.D.   On: 07/17/2020 14:01   DG Abdomen 1 View  Result Date: 07/17/2020 CLINICAL DATA:  Fall. EXAM: ABDOMEN - 1 VIEW COMPARISON:  February 04, 2014. FINDINGS: The bowel gas pattern is normal. No radio-opaque calculi or other significant radiographic abnormality are seen. Surgical clips are seen in the pelvis. IMPRESSION: No evidence of bowel obstruction or ileus. Aortic Atherosclerosis (ICD10-I70.0). Electronically Signed   By: Marijo Conception M.D.   On: 07/17/2020 14:00   CT Head Wo Contrast  Result Date: 07/17/2020 CLINICAL DATA:  Status post fall.  Initial encounter. EXAM: CT HEAD WITHOUT CONTRAST CT CERVICAL SPINE WITHOUT CONTRAST TECHNIQUE: Multidetector CT imaging of the head and cervical spine was performed following the standard protocol without intravenous contrast. Multiplanar CT image reconstructions of the cervical spine were also generated. COMPARISON:  None. FINDINGS: CT HEAD FINDINGS Brain: No evidence of acute infarction, hemorrhage, hydrocephalus, extra-axial collection or mass lesion/mass effect. Atrophy and chronic microvascular ischemic change noted.  Vascular: No hyperdense vessel or unexpected calcification. Skull: No fracture or focal lesion. Sinuses/Orbits: Motion limits evaluation. No abnormality is seen. The patient is status post cataract surgery Other: None. CT CERVICAL SPINE FINDINGS Alignment: Trace anterolisthesis C3 on C4. Straightening of lordosis. Skull base and vertebrae: No acute fracture. No primary bone lesion or focal pathologic process. Soft tissues and spinal canal: No prevertebral fluid or swelling. No visible canal hematoma. Disc levels: Mild loss of disc space height and  endplate spurring are seen at C5-6. Upper chest: Clear. Other: None. IMPRESSION: No acute abnormality head or cervical spine. Cortical atrophy and chronic microvascular ischemic change. Mild degenerative disc disease C5-6. Electronically Signed   By: Inge Rise M.D.   On: 07/17/2020 14:42   CT ANGIO NECK W OR WO CONTRAST  Result Date: 07/17/2020 CLINICAL DATA:  Infarcts on MRI EXAM: CT ANGIOGRAPHY HEAD AND NECK TECHNIQUE: Multidetector CT imaging of the head and neck was performed using the standard protocol during bolus administration of intravenous contrast. Multiplanar CT image reconstructions and MIPs were obtained to evaluate the vascular anatomy. Carotid stenosis measurements (when applicable) are obtained utilizing NASCET criteria, using the distal internal carotid diameter as the denominator. CONTRAST:  10mL OMNIPAQUE IOHEXOL 350 MG/ML SOLN COMPARISON:  Correlation made with same day brain FINDINGS: CTA NECK Aortic arch: Great vessel origins are patent. Right carotid system: Patent. Mild calcified plaque at the ICA origin causing minimal stenosis. Left carotid system: Patent. Minimal calcified plaque at the ICA origin without stenosis. Vertebral arteries: Patent and codominant. Skeleton: Degenerative changes of the cervical spine. Other neck: Negative. Upper chest: Included upper lungs are clear. Review of the MIP images confirms the above findings CTA HEAD Anterior circulation: Intracranial internal carotid arteries are patent with mild calcified plaque. Right M1 MCA is patent. There is nearly occlusive thrombus within the proximal posterior division resulting in diminished opacification of distal vessels. Left middle and both anterior cerebral arteries are patent. Posterior circulation: Intracranial vertebral arteries, basilar artery, and posterior cerebral arteries are patent. Left posterior communicating artery is present. Moderate stenosis at the proximal right P2 PCA. Venous sinuses: Patent  as allowed by contrast bolus timing. Review of the MIP images confirms the above findings IMPRESSION: Nearly occlusive thrombus within the proximal posterior division of the right MCA. Diminished flow within more distal vessels. Moderate stenosis proximal right P2 PCA. No significant stenosis in the neck. Primary result called by telephone at the time of interpretation on 07/17/2020 at 8:40 pm to provider Pecolia Ades, who verbally acknowledged these results. Electronically Signed   By: Macy Mis M.D.   On: 07/17/2020 20:46   CT Cervical Spine Wo Contrast  Result Date: 07/17/2020 CLINICAL DATA:  Status post fall.  Initial encounter. EXAM: CT HEAD WITHOUT CONTRAST CT CERVICAL SPINE WITHOUT CONTRAST TECHNIQUE: Multidetector CT imaging of the head and cervical spine was performed following the standard protocol without intravenous contrast. Multiplanar CT image reconstructions of the cervical spine were also generated. COMPARISON:  None. FINDINGS: CT HEAD FINDINGS Brain: No evidence of acute infarction, hemorrhage, hydrocephalus, extra-axial collection or mass lesion/mass effect. Atrophy and chronic microvascular ischemic change noted. Vascular: No hyperdense vessel or unexpected calcification. Skull: No fracture or focal lesion. Sinuses/Orbits: Motion limits evaluation. No abnormality is seen. The patient is status post cataract surgery Other: None. CT CERVICAL SPINE FINDINGS Alignment: Trace anterolisthesis C3 on C4. Straightening of lordosis. Skull base and vertebrae: No acute fracture. No primary bone lesion or focal pathologic process. Soft tissues and spinal canal:  No prevertebral fluid or swelling. No visible canal hematoma. Disc levels: Mild loss of disc space height and endplate spurring are seen at C5-6. Upper chest: Clear. Other: None. IMPRESSION: No acute abnormality head or cervical spine. Cortical atrophy and chronic microvascular ischemic change. Mild degenerative disc disease C5-6.  Electronically Signed   By: Inge Rise M.D.   On: 07/17/2020 14:42   MR BRAIN WO CONTRAST  Result Date: 07/17/2020 CLINICAL DATA:  Unwitnessed fall, questionable left arm/grip weakness EXAM: MRI HEAD WITHOUT CONTRAST TECHNIQUE: Multiplanar, multiecho pulse sequences of the brain and surrounding structures were obtained without intravenous contrast. COMPARISON:  None. FINDINGS: Motion degraded DWI and sagittal T1 sequences were obtained. Patient could not tolerate remainder of the study. There is patchy diffusion hyperintensity in the right frontal, parietal, and temporal lobes. IMPRESSION: Two sequences performed with significant motion degradation. Likely acute to subacute right MCA territory infarcts. Electronically Signed   By: Macy Mis M.D.   On: 07/17/2020 17:35   ECHOCARDIOGRAM COMPLETE  Result Date: 07/18/2020    ECHOCARDIOGRAM REPORT   Patient Name:   Monica Ward [facility] Date of Exam: 07/18/2020 Medical Rec #:  [id number]          Height:       64.0 in Accession #:    [id number]         Weight:       108.9 lb Date of Birth:  02/16/1930           BSA:          1.511 m Patient Age:    67 years           BP:           152/73 mmHg Patient Gender: F                  HR:           57 bpm. Exam Location:  Inpatient Procedure: 2D Echo Indications:    Stroke I63.9  History:        Patient has no prior history of Echocardiogram examinations.  Sonographer:    Mikki Santee RDCS (AE) Referring Phys: [id number] [place]  1. Left ventricular ejection fraction, by estimation, is 60 to 65%. The left ventricle has normal function. The left ventricle has no regional wall motion abnormalities. Left ventricular diastolic parameters are consistent with Grade II diastolic dysfunction (pseudonormalization). Elevated left atrial pressure.  2. Right ventricular systolic function is normal. The right ventricular size is normal. There is mildly elevated pulmonary artery systolic pressure. The  estimated right ventricular systolic pressure is 75.9 mmHg.  3. Left atrial size was moderately dilated.  4. The mitral valve is normal in structure. No evidence of mitral valve regurgitation.  5. The aortic valve is tricuspid. Aortic valve regurgitation is not visualized. Mild aortic valve sclerosis is present, with no evidence of aortic valve stenosis.  6. The inferior vena cava is dilated in size with >50% respiratory variability, suggesting right atrial pressure of 8 mmHg. FINDINGS  Left Ventricle: Left ventricular ejection fraction, by estimation, is 60 to 65%. The left ventricle has normal function. The left ventricle has no regional wall motion abnormalities. The left ventricular internal cavity size was normal in size. There is  no left ventricular hypertrophy. Left ventricular diastolic parameters are consistent with Grade II diastolic dysfunction (pseudonormalization). Elevated left atrial pressure. Right Ventricle: The right ventricular size is normal. No increase in right ventricular wall thickness. Right ventricular  systolic function is normal. There is mildly elevated pulmonary artery systolic pressure. The tricuspid regurgitant velocity is 2.70  m/s, and with an assumed right atrial pressure of 8 mmHg, the estimated right ventricular systolic pressure is 41.9 mmHg. Left Atrium: Left atrial size was moderately dilated. Right Atrium: Right atrial size was normal in size. Pericardium: There is no evidence of pericardial effusion. Mitral Valve: The mitral valve is normal in structure. No evidence of mitral valve regurgitation. Tricuspid Valve: The tricuspid valve is normal in structure. Tricuspid valve regurgitation is trivial. Aortic Valve: The aortic valve is tricuspid. Aortic valve regurgitation is not visualized. Mild aortic valve sclerosis is present, with no evidence of aortic valve stenosis. Pulmonic Valve: The pulmonic valve was not well visualized. Pulmonic valve regurgitation is trivial. Aorta:  The aortic root is normal in size and structure. Venous: The inferior vena cava is dilated in size with greater than 50% respiratory variability, suggesting right atrial pressure of 8 mmHg. IAS/Shunts: The interatrial septum was not well visualized.  LEFT VENTRICLE PLAX 2D LVIDd:         4.20 cm  Diastology LVIDs:         3.10 cm  LV e' medial:    6.20 cm/s LV PW:         0.80 cm  LV E/e' medial:  19.5 LV IVS:        0.90 cm  LV e' lateral:   7.51 cm/s LVOT diam:     2.00 cm  LV E/e' lateral: 16.1 LV SV:         84 LV SV Index:   56 LVOT Area:     3.14 cm  RIGHT VENTRICLE RV S prime:     12.80 cm/s TAPSE (M-mode): 2.6 cm LEFT ATRIUM             Index       RIGHT ATRIUM          Index LA diam:        3.30 cm 2.18 cm/m  RA Area:     7.71 cm LA Vol (A2C):   65.8 ml 43.55 ml/m RA Volume:   14.40 ml 9.53 ml/m LA Vol (A4C):   67.5 ml 44.67 ml/m LA Biplane Vol: 73.1 ml 48.38 ml/m  AORTIC VALVE LVOT Vmax:   105.00 cm/s LVOT Vmean:  70.100 cm/s LVOT VTI:    0.268 m  AORTA Ao Root diam: 3.30 cm MITRAL VALVE                TRICUSPID VALVE MV Area (PHT): 2.16 cm     TR Peak grad:   29.2 mmHg MV Decel Time: 352 msec     TR Vmax:        270.00 cm/s MV E velocity: 121.00 cm/s MV A velocity: 101.00 cm/s  SHUNTS MV E/A ratio:  1.20         Systemic VTI:  0.27 m                             Systemic Diam: 2.00 cm Oswaldo Milian MD Electronically signed by Oswaldo Milian MD Signature Date/Time: 07/18/2020/3:25:17 PM    Final    VAS [place] LOWER EXTREMITY VENOUS (DVT)  Result Date: 07/18/2020  Lower Venous DVT Study Indications: Embolic stroke in cancer patient.  Comparison Study: No prior studies. Performing Technologist: Darlin Coco RDMS,RVT  Examination Guidelines: A complete evaluation includes B-mode imaging, spectral Doppler, color  Doppler, and power Doppler as needed of all accessible portions of each vessel. Bilateral testing is considered an integral part of a complete examination. Limited examinations for  reoccurring indications may be performed as noted. The reflux portion of the exam is performed with the patient in reverse Trendelenburg.  +---------+---------------+---------+-----------+----------+--------------+ RIGHT    CompressibilityPhasicitySpontaneityPropertiesThrombus Aging +---------+---------------+---------+-----------+----------+--------------+ CFV      Full           Yes      Yes                                 +---------+---------------+---------+-----------+----------+--------------+ SFJ      Full                                                        +---------+---------------+---------+-----------+----------+--------------+ FV Prox  Full                                                        +---------+---------------+---------+-----------+----------+--------------+ FV Mid   Full                                                        +---------+---------------+---------+-----------+----------+--------------+ FV DistalFull                                                        +---------+---------------+---------+-----------+----------+--------------+ PFV      Full                                                        +---------+---------------+---------+-----------+----------+--------------+ POP      Full           Yes      Yes                                 +---------+---------------+---------+-----------+----------+--------------+ PTV      Full                                                        +---------+---------------+---------+-----------+----------+--------------+ PERO     Full                                                        +---------+---------------+---------+-----------+----------+--------------+   +---------+---------------+---------+-----------+----------+--------------+  LEFT     CompressibilityPhasicitySpontaneityPropertiesThrombus Aging  +---------+---------------+---------+-----------+----------+--------------+ CFV      Full           Yes      Yes                                 +---------+---------------+---------+-----------+----------+--------------+ SFJ      Full                                                        +---------+---------------+---------+-----------+----------+--------------+ FV Prox  Full                                                        +---------+---------------+---------+-----------+----------+--------------+ FV Mid   Full                                                        +---------+---------------+---------+-----------+----------+--------------+ FV DistalFull                                                        +---------+---------------+---------+-----------+----------+--------------+ PFV      Full                                                        +---------+---------------+---------+-----------+----------+--------------+ POP      Full           Yes      Yes                                 +---------+---------------+---------+-----------+----------+--------------+ PTV      Full                                                        +---------+---------------+---------+-----------+----------+--------------+ PERO     Full                                                        +---------+---------------+---------+-----------+----------+--------------+     Summary: RIGHT: - There is no evidence of deep vein thrombosis in the lower extremity.  - A cystic structure is found in the popliteal fossa.  LEFT: - There is no evidence of deep vein thrombosis in the lower extremity.  -  No cystic structure found in the popliteal fossa.  *See table(s) above for measurements and observations. Electronically signed by Curt Jews MD on 07/18/2020 at 2:02:53 PM.    Final      Medical Consultants:    None.   Subjective:    Monica Ward has no new  complaints.  Objective:    Vitals:   07/18/20 2008 07/18/20 2317 07/19/20 0429 07/19/20 0724  BP: (!) 150/67 (!) 132/91 127/84 (!) 136/95  Pulse: 67 (!) 101 86 91  Resp: 16 18 18 18   Temp: 97.7 F (36.5 C) 97.8 F (36.6 C) 97.9 F (36.6 C) 97.7 F (36.5 C)  TempSrc: Oral Oral Oral Oral  SpO2: 97% 98% 96% 97%  Weight:      Height:       SpO2: 97 %   Intake/Output Summary (Last 24 hours) at 07/19/2020 0829 Last data filed at 07/19/2020 0300 Gross per 24 hour  Intake 100 ml  Output 900 ml  Net -800 ml   Filed Weights   07/17/20 2356  Weight: 49.4 kg    Exam: General exam: In no acute distress. Respiratory system: Good air movement and clear to auscultation. Cardiovascular system: S1 & S2 heard, RRR. No JVD. Gastrointestinal system: Abdomen is nondistended, soft and nontender.  Extremities: No pedal edema. Skin: No rashes, lesions or ulcers Psychiatry: Judgement and insight appear normal. Mood & affect appropriate.   Data Reviewed:    Labs: Basic Metabolic Panel: Recent Labs  Lab 07/17/20 1222  NA 141  K 3.6  CL 107  CO2 26  GLUCOSE 97  BUN 21  CREATININE 0.73  CALCIUM 9.5   GFR Estimated Creatinine Clearance: 37.9 mL/min (by C-G formula based on SCr of 0.73 mg/dL). Liver Function Tests: Recent Labs  Lab 07/17/20 1222  AST 23  ALT 16  ALKPHOS 55  BILITOT 0.4  PROT 6.9  ALBUMIN 3.9   Recent Labs  Lab 07/17/20 1222  LIPASE 27   No results for input(s): AMMONIA in the last 168 hours. Coagulation profile No results for input(s): INR, PROTIME in the last 168 hours. COVID-19 Labs  No results for input(s): DDIMER, FERRITIN, LDH, CRP in the last 72 hours.  No results found for: SARSCOV2NAA  CBC: Recent Labs  Lab 07/17/20 1222  WBC 8.4  NEUTROABS 6.8  HGB 14.9  HCT 44.1  MCV 92.6  PLT 319   Cardiac Enzymes: Recent Labs  Lab 07/17/20 1222  CKTOTAL 81   BNP (last 3 results) No results for input(s): PROBNP in the last 8760  hours. CBG: No results for input(s): GLUCAP in the last 168 hours. D-Dimer: No results for input(s): DDIMER in the last 72 hours. Hgb A1c: Recent Labs    07/17/20 1222  HGBA1C 5.6   Lipid Profile: Recent Labs    07/17/20 1222  CHOL 243*  HDL 68  LDLCALC 160*  TRIG 76  CHOLHDL 3.6   Thyroid function studies: No results for input(s): TSH, T4TOTAL, T3FREE, THYROIDAB in the last 72 hours.  Invalid input(s): FREET3 Anemia work up: No results for input(s): VITAMINB12, FOLATE, FERRITIN, TIBC, IRON, RETICCTPCT in the last 72 hours. Sepsis Labs: Recent Labs  Lab 07/17/20 1222  WBC 8.4   Microbiology Recent Results (from the past 240 hour(s))  Urine culture     Status: Abnormal   Collection Time: 07/17/20 12:53 PM   Specimen: Urine, Random  Result Value Ref Range Status   Specimen Description URINE, RANDOM  Final   Special  Requests   Final    NONE Performed at [facility], [place] [address], [place], [place] [address]    Culture >=100,000 COLONIES/mL KLEBSIELLA PNEUMONIAE (A)  Final   Report Status 07/19/2020 FINAL  Final   Organism ID, Bacteria KLEBSIELLA PNEUMONIAE (A)  Final      Susceptibility   Klebsiella pneumoniae - MIC*    AMPICILLIN >=32 RESISTANT Resistant     CEFAZOLIN <=4 SENSITIVE Sensitive     CEFEPIME <=0.12 SENSITIVE Sensitive     CEFTRIAXONE <=0.25 SENSITIVE Sensitive     CIPROFLOXACIN <=0.25 SENSITIVE Sensitive     GENTAMICIN <=1 SENSITIVE Sensitive     IMIPENEM <=0.25 SENSITIVE Sensitive     NITROFURANTOIN 64 INTERMEDIATE Intermediate     TRIMETH/SULFA <=20 SENSITIVE Sensitive     AMPICILLIN/SULBACTAM <=2 SENSITIVE Sensitive     PIP/TAZO <=4 SENSITIVE Sensitive     * >=100,000 COLONIES/mL KLEBSIELLA PNEUMONIAE     Medications:   . aspirin EC  325 mg Oral Daily  . atorvastatin  80 mg Oral Daily  . bacitracin   Topical BID  . clopidogrel  75 mg Oral Daily  . enoxaparin (LOVENOX) injection  40 mg Subcutaneous Q24H   Continuous  Infusions: . cefTRIAXone (ROCEPHIN)  IV Stopped (07/19/20 0218)  . lactated ringers 100 mL/hr at 07/17/20 1351      LOS: 1 day   Charlynne Cousins  [facility]  07/19/2020, 8:29 AM

## 2020-07-19 NOTE — TOC Progression Note (Addendum)
Transition of Care [facility]) - Progression Note    Patient Details  Name: ORTHA METTS MRN: [id number] Date of Birth: 1930/03/01  Transition of Care [facility] CM/SW [place], [place] Phone Number: 07/19/2020, 11:05 AM  Clinical Narrative:   CSW met with patient son at bedside to provide bed offers. Son expressed interest in [place], which were still pending. CSW asked them to review and obtained bed offers for both. Son to review options available and will get back to CSW with choice. CSW updated son with hope to discharge to SNF tomorrow, if medically stable, and son indicated agreement. CSW to follow.  UPDATE: CSW received update from patient's son that they have chosen [place]. [facility] will have a bed available tomorrow. CSW sent in insurance authorization request. CSW met with son at the bedside to discuss concerns about insurance coverage after copay days begin. Son Patent attorney of information. CSW to follow.    Expected Discharge Plan: [place] Barriers to Discharge: Continued Medical Work up,Insurance Authorization  Expected Discharge Plan and Services Expected Discharge Plan: [place] Choice: [place] arrangements for the past 2 months: Single Family Home                                       Social Determinants of Health (SDOH) Interventions    Readmission Risk Interventions No flowsheet data found.

## 2020-07-19 NOTE — Progress Notes (Addendum)
STROKE TEAM PROGRESS NOTE   INTERVAL HISTORY Overnight cardiac monitoring revealed A Fib. No other events overnight.  Her son is at the bedside.  Her son reports that she has been working hard with PT/OT and he has been helping her due the exercises regularly. Discussed stopping the aspirin and Plavix tomorrow and starting Eliquis. Discussed the risks of bleeding and answered all questions. She had no questions and her son did not have any questions either. Social Worker told us that she had been accepted to 2 SNF so discharge could be soon. She is able to hold all of her limbs well and is more alert and awake today. Given the finding of A fib discussed that Cardiology will be consulted for recommendations.  Hospitalist Service is Primary. Cardiology is consulting.  Vitals:   07/18/20 2008 07/18/20 2317 07/19/20 0429 07/19/20 0724  BP: (!) 150/67 (!) 132/91 127/84 (!) 136/95  Pulse: 67 (!) 101 86 91  Resp: 16 18 18 18   Temp: 97.7 F (36.5 C) 97.8 F (36.6 C) 97.9 F (36.6 C) 97.7 F (36.5 C)  TempSrc: Oral Oral Oral Oral  SpO2: 97% 98% 96% 97%  Weight:      Height:       CBC:  Recent Labs  Lab 07/17/20 1222  WBC 8.4  NEUTROABS 6.8  HGB 14.9  HCT 44.1  MCV 92.6  PLT 814   Basic Metabolic Panel:  Recent Labs  Lab 07/17/20 1222  NA 141  K 3.6  CL 107  CO2 26  GLUCOSE 97  BUN 21  CREATININE 0.73  CALCIUM 9.5   Lipid Panel:  Recent Labs  Lab 07/17/20 1222  CHOL 243*  TRIG 76  HDL 68  CHOLHDL 3.6  VLDL 15  LDLCALC 160*   HgbA1c:  Recent Labs  Lab 07/17/20 1222  HGBA1C 5.6   Urine Drug Screen: No results for input(s): LABOPIA, COCAINSCRNUR, LABBENZ, AMPHETMU, THCU, LABBARB in the last 168 hours.  Alcohol Level No results for input(s): ETH in the last 168 hours.  IMAGING past 24 hours ECHOCARDIOGRAM COMPLETE  Result Date: 07/18/2020    ECHOCARDIOGRAM REPORT   Patient Name:   Monica [facility] Date of Exam: 07/18/2020 Medical Rec #:  [id number]           Height:       64.0 in Accession #:    [id number]         Weight:       108.9 lb Date of Birth:  09-Oct-1929           BSA:          1.511 m Patient Age:    87 years           BP:           152/73 mmHg Patient Gender: F                  HR:           57 bpm. Exam Location:  Inpatient Procedure: 2D Echo Indications:    Stroke I63.9  History:        Patient has no prior history of Echocardiogram examinations.  Sonographer:    Mikki Santee RDCS (AE) Referring Phys: [id number] [place]  1. Left ventricular ejection fraction, by estimation, is 60 to 65%. The left ventricle has normal function. The left ventricle has no regional wall motion abnormalities. Left ventricular diastolic parameters are consistent with Grade II diastolic dysfunction (  pseudonormalization). Elevated left atrial pressure.  2. Right ventricular systolic function is normal. The right ventricular size is normal. There is mildly elevated pulmonary artery systolic pressure. The estimated right ventricular systolic pressure is 76.1 mmHg.  3. Left atrial size was moderately dilated.  4. The mitral valve is normal in structure. No evidence of mitral valve regurgitation.  5. The aortic valve is tricuspid. Aortic valve regurgitation is not visualized. Mild aortic valve sclerosis is present, with no evidence of aortic valve stenosis.  6. The inferior vena cava is dilated in size with >50% respiratory variability, suggesting right atrial pressure of 8 mmHg. FINDINGS  Left Ventricle: Left ventricular ejection fraction, by estimation, is 60 to 65%. The left ventricle has normal function. The left ventricle has no regional wall motion abnormalities. The left ventricular internal cavity size was normal in size. There is  no left ventricular hypertrophy. Left ventricular diastolic parameters are consistent with Grade II diastolic dysfunction (pseudonormalization). Elevated left atrial pressure. Right Ventricle: The right ventricular size is  normal. No increase in right ventricular wall thickness. Right ventricular systolic function is normal. There is mildly elevated pulmonary artery systolic pressure. The tricuspid regurgitant velocity is 2.70  m/s, and with an assumed right atrial pressure of 8 mmHg, the estimated right ventricular systolic pressure is 60.7 mmHg. Left Atrium: Left atrial size was moderately dilated. Right Atrium: Right atrial size was normal in size. Pericardium: There is no evidence of pericardial effusion. Mitral Valve: The mitral valve is normal in structure. No evidence of mitral valve regurgitation. Tricuspid Valve: The tricuspid valve is normal in structure. Tricuspid valve regurgitation is trivial. Aortic Valve: The aortic valve is tricuspid. Aortic valve regurgitation is not visualized. Mild aortic valve sclerosis is present, with no evidence of aortic valve stenosis. Pulmonic Valve: The pulmonic valve was not well visualized. Pulmonic valve regurgitation is trivial. Aorta: The aortic root is normal in size and structure. Venous: The inferior vena cava is dilated in size with greater than 50% respiratory variability, suggesting right atrial pressure of 8 mmHg. IAS/Shunts: The interatrial septum was not well visualized.  LEFT VENTRICLE PLAX 2D LVIDd:         4.20 cm  Diastology LVIDs:         3.10 cm  LV e' medial:    6.20 cm/s LV PW:         0.80 cm  LV E/e' medial:  19.5 LV IVS:        0.90 cm  LV e' lateral:   7.51 cm/s LVOT diam:     2.00 cm  LV E/e' lateral: 16.1 LV SV:         84 LV SV Index:   56 LVOT Area:     3.14 cm  RIGHT VENTRICLE RV S prime:     12.80 cm/s TAPSE (M-mode): 2.6 cm LEFT ATRIUM             Index       RIGHT ATRIUM          Index LA diam:        3.30 cm 2.18 cm/m  RA Area:     7.71 cm LA Vol (A2C):   65.8 ml 43.55 ml/m RA Volume:   14.40 ml 9.53 ml/m LA Vol (A4C):   67.5 ml 44.67 ml/m LA Biplane Vol: 73.1 ml 48.38 ml/m  AORTIC VALVE LVOT Vmax:   105.00 cm/s LVOT Vmean:  70.100 cm/s LVOT VTI:     0.268 m  AORTA  Ao Root diam: 3.30 cm MITRAL VALVE                TRICUSPID VALVE MV Area (PHT): 2.16 cm     TR Peak grad:   29.2 mmHg MV Decel Time: 352 msec     TR Vmax:        270.00 cm/s MV E velocity: 121.00 cm/s MV A velocity: 101.00 cm/s  SHUNTS MV E/A ratio:  1.20         Systemic VTI:  0.27 m                             Systemic Diam: 2.00 cm Oswaldo Milian MD Electronically signed by Oswaldo Milian MD Signature Date/Time: 07/18/2020/3:25:17 PM    Final    VAS [place] LOWER EXTREMITY VENOUS (DVT)  Result Date: 07/18/2020  Lower Venous DVT Study Indications: Embolic stroke in cancer patient.  Comparison Study: No prior studies. Performing Technologist: Darlin Coco RDMS,RVT  Examination Guidelines: A complete evaluation includes B-mode imaging, spectral Doppler, color Doppler, and power Doppler as needed of all accessible portions of each vessel. Bilateral testing is considered an integral part of a complete examination. Limited examinations for reoccurring indications may be performed as noted. The reflux portion of the exam is performed with the patient in reverse Trendelenburg.  +---------+---------------+---------+-----------+----------+--------------+ RIGHT    CompressibilityPhasicitySpontaneityPropertiesThrombus Aging +---------+---------------+---------+-----------+----------+--------------+ CFV      Full           Yes      Yes                                 +---------+---------------+---------+-----------+----------+--------------+ SFJ      Full                                                        +---------+---------------+---------+-----------+----------+--------------+ FV Prox  Full                                                        +---------+---------------+---------+-----------+----------+--------------+ FV Mid   Full                                                         +---------+---------------+---------+-----------+----------+--------------+ FV DistalFull                                                        +---------+---------------+---------+-----------+----------+--------------+ PFV      Full                                                        +---------+---------------+---------+-----------+----------+--------------+  POP      Full           Yes      Yes                                 +---------+---------------+---------+-----------+----------+--------------+ PTV      Full                                                        +---------+---------------+---------+-----------+----------+--------------+ PERO     Full                                                        +---------+---------------+---------+-----------+----------+--------------+   +---------+---------------+---------+-----------+----------+--------------+ LEFT     CompressibilityPhasicitySpontaneityPropertiesThrombus Aging +---------+---------------+---------+-----------+----------+--------------+ CFV      Full           Yes      Yes                                 +---------+---------------+---------+-----------+----------+--------------+ SFJ      Full                                                        +---------+---------------+---------+-----------+----------+--------------+ FV Prox  Full                                                        +---------+---------------+---------+-----------+----------+--------------+ FV Mid   Full                                                        +---------+---------------+---------+-----------+----------+--------------+ FV DistalFull                                                        +---------+---------------+---------+-----------+----------+--------------+ PFV      Full                                                         +---------+---------------+---------+-----------+----------+--------------+ POP      Full           Yes      Yes                                 +---------+---------------+---------+-----------+----------+--------------+  PTV      Full                                                        +---------+---------------+---------+-----------+----------+--------------+ PERO     Full                                                        +---------+---------------+---------+-----------+----------+--------------+     Summary: RIGHT: - There is no evidence of deep vein thrombosis in the lower extremity.  - A cystic structure is found in the popliteal fossa.  LEFT: - There is no evidence of deep vein thrombosis in the lower extremity.  - No cystic structure found in the popliteal fossa.  *See table(s) above for measurements and observations. Electronically signed by Curt Jews MD on 07/18/2020 at 2:02:53 PM.    Final     PHYSICAL EXAM Blood pressure (!) 136/95, pulse 91, temperature 97.7 F (36.5 C), temperature source Oral, resp. rate 18, height 5\' 4"  (1.626 m), weight 49.4 kg, SpO2 97 %.  General: alert and awake,  elderly Caucasian female, no apparent distress  Lungs: Symmetrical Chest rise, no labored breathing  Cardio: Regular Rate and Rhythm  Abdomen: Soft, non-tender  Neuro: Alert, oriented, thought content appropriate.  Speech still has some interruptions but is more fluent than yesterday.  Able to follow all commands without difficulty. Cranial Nerves: II:  Visual fields grossly normal, pupils equal, round, reactive to light and accommodation III,IV, VI: ptosis not present, extra-ocular motions intact bilaterally V,VII: smile symmetric, facial light touch sensation normal bilaterally VIII: hearing normal bilaterally IX,X: uvula rises symmetrically XI: bilateral shoulder shrug XII: midline tongue extension without atrophy or fasciculations  Motor: Right : Upper extremity    4/5    Left:     Upper extremity   4/5  Lower extremity   4/5     Lower extremity   4/5 Tone and bulk:normal tone throughout; no atrophy noted Sensory: light touch intact throughout, bilaterally Cerebellar: normal finger-to-nose Gait: deferred   ASSESSMENT/PLAN Ms. TAZIYAH IANNUZZI is a 87 y.o. female with history of chronic constipation, diverticulitis, SBO's, colectomy, appendectomy, hysterectomy presenting with acute onset of confusion and agitation. Was brought from home via EMS with agitation and confusion after being found on the floor by her son at 58 AM today. There was bruising to her left forearm and left lower leg with a skin tearon the left shin. The son does not know when the patient fell, but believes that it was some time in the early morning. LKN was 9:30 PM yesterdaywhen she was going to bed.He states when he was assisting her to stand this morning, she was confused with left sided weakness and slurred speech.On arrival to the ED the patient was grabbing at vaginal area stating it hurtsand also hadfrequent urination.She was confused and"pulling/grabbing at everything". A UTI is suspected by the ED physician.  Given her A fib finding will stop Aspirin and Plavix and start Eliquis tomorrow. Has been accepted to SNF so may be able to discharge tomorrow pending Cardiology consult and recommendations.  Right MCA Acute/Subacute Infarct, embolic pattern, likely due to newly diagnosed  afib  CT head - No acute abnormality head.  CTA head & neck - Nearly occlusive thrombus within the proximal posterior division of the right MCA. Diminished flow within more distal vessels. Moderate stenosis proximal right P2 PCA. No significant stenosis in the neck.  MRI - limited sequences but acute to subacute right MCA territory infarcts.  2D Echo EF 60-65%  LE venous Doppler - no DVT  Tele monitoring showed PAF and confirmed with cardiology EP  LDL 160  HgbA1c 5.6  VTE prophylaxis -  Lovenox  aspirin 81 mg daily prior to admission, now on aspirin 325 mg daily and clopidogrel 75 mg daily.  Continue DAPT for today and will start eliquis tomorrow.  Therapy recommendations:  SNF  Disposition:  Pending  PAF, new diagnosis  Overnight tele showed PAF  Confirmed with EP  Cardiology consult appreicated  Rate controlled at this time  Will start eliquis tomorrow.  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 Pt was seen and examined.   Patient doing well, no acute event overnight.  Found to have A. fib on telemetry overnight, confirmed by EP, will start Eliquis tomorrow for 3 days post stroke.  Continue DAPT for today.  Son at bedside, patient sitting in bed for lunch, patient awake alert, pleasant, smiling.  Son stated that patient has not had a good meal for a while but today she seems eating well.  No aphasia, able to name and repeat.  Follow simple command.  Visual field testing difficult, not corporative but no hemianopia observation.  No facial droop, moving both arm and leg symmetrically although with generalized weakness.  Given her new diagnosed A. fib, appreciate cardiology consult for further management and follow-up.  We will continue DAPT for today and start Eliquis tomorrow for stroke prevention.  Continue statin.  BP goal 1 30-1 50 given right MCA occlusion.   PT/OT.  For detailed assessment and plan, please refer to above as I have made changes wherever appropriate.   Neurology will sign off. Please call with questions. Pt will follow up with stroke clinic NP at [facility] in about 4 weeks. Thanks for the consult.  Rosalin Hawking, MD PhD Stroke Neurology 07/19/2020 3:04 PM     To contact Stroke Continuity provider, please refer to [email or web address] After hours, contact General Neurology

## 2020-07-19 NOTE — Progress Notes (Signed)
ANTICOAGULATION CONSULT NOTE - Initial Consult  Pharmacy Consult for Apixaban Indication: atrial fibrillation  No Known Allergies  Patient Measurements: Height: 5\' 4"  (162.6 cm) Weight: 49.4 kg (108 lb 14.5 oz) IBW/kg (Calculated) : 54.7  Vital Signs: Temp: 97.7 F (36.5 C) (03/24 1136) Temp Source: Oral (03/24 1136) BP: 149/72 (03/24 1136) Pulse Rate: 60 (03/24 1136)  Labs: Recent Labs    07/17/20 1222  HGB 14.9  HCT 44.1  PLT 319  CREATININE 0.73  CKTOTAL 81    Estimated Creatinine Clearance: 37.9 mL/min (by C-G formula based on SCr of 0.73 mg/dL).   Medical History: Past Medical History:  Diagnosis Date  . Arthritis    "probably" (01/28/2012)  . Cancer ([place]) Around 2002-2003   Unclear history of "7 lb" abdominal tumor treated at [facility] ~2003 s/p open abdominal colonic resection and radiation  . Complication of anesthesia    "trouble waking up; too much med given" (01/28/2012)  . Constipation 02/03/2014  . Constipation, chronic 02/06/2014  . Diverticulitis    Per chart   . Diverticulosis 02/03/2014  . High cholesterol   . Leiomyosarcoma ([place]) 10/28/2011   Patient had surgery for a spindle cell tumor in 1999. Patient had surgery in 2000 and mass which was presumed to be a leiomyosarcoma rather than a spindle cell cancer. I have no further records of path reports to give me a good definite path diagnosis of her tumor resection.   . Precordial pain 02/12/2012  . SBO (small bowel obstruction) ([place]) 08/2009   Early, partial SBO improved with conservative Tx     Assessment: 87 years of age female admitted with right MCA acute/subacute infarct and newly diagnosis atrial fibrillation with plans to start Eliquis therapy tomorrow 3/25. Pharmacy has been consulted for Eliquis dosing.   Patient received Aspirin and Plavix today - these medications have now been discontinued and patient will be on Eliquis alone starting tomorrow 3/25. H/H and platelets are within normal limits.  SCr is 0.73 today (baseline based on prior labs appears to be 0.5 to 0.7). LFTs are within normal limits as of 07/17/20.  Age >80 and Wt <60 kg qualifies patient for lower dose of 2.5mg  po BID.   Goal of Therapy:  Monitor platelets by anticoagulation protocol: Yes   Plan:  Discontinue Lovenox therapy after today's dose as ordered.  Start Eliquis (Apixaban) 2.5mg  po BID on Friday 07/20/20.  Monitor CBC and for any signs or symptoms of bleeding.   Sloan Leiter, PharmD, BCPS, BCCCP Clinical Pharmacist Please refer to [facility] for [place] numbers 07/19/2020,3:28 PM

## 2020-07-20 DIAGNOSIS — I63511 Cerebral infarction due to unspecified occlusion or stenosis of right middle cerebral artery: Secondary | ICD-10-CM | POA: Diagnosis not present

## 2020-07-20 DIAGNOSIS — N390 Urinary tract infection, site not specified: Secondary | ICD-10-CM | POA: Diagnosis not present

## 2020-07-20 MED ORDER — METOPROLOL TARTRATE 25 MG PO TABS: 12.5000 mg | ORAL_TABLET | Freq: Two times a day (BID) | ORAL | Status: DC

## 2020-07-20 MED ORDER — METOPROLOL TARTRATE 12.5 MG HALF TABLET
12.5000 mg | ORAL_TABLET | Freq: Two times a day (BID) | ORAL | Status: DC
  Administered 2020-07-20: 12.5 mg via ORAL
  Filled 2020-07-20: qty 1

## 2020-07-20 MED ORDER — CEPHALEXIN 500 MG PO CAPS: 500.0000 mg | ORAL_CAPSULE | Freq: Two times a day (BID) | ORAL | 0 refills | Status: AC

## 2020-07-20 MED ORDER — APIXABAN 2.5 MG PO TABS: 2.5000 mg | ORAL_TABLET | Freq: Two times a day (BID) | ORAL | 0 refills | Status: DC

## 2020-07-20 MED ORDER — ATORVASTATIN CALCIUM 80 MG PO TABS: 80.0000 mg | ORAL_TABLET | Freq: Every day | ORAL | 3 refills | Status: DC

## 2020-07-20 NOTE — Progress Notes (Signed)
Physical Therapy Treatment Patient Details Name: Monica Ward MRN: [id number] DOB: 1929/11/27 Today's Date: 07/20/2020    History of Present Illness Pt is an 87 y/o female admitted 07/17/20 after being found down at home for unknown amount of time. 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 PMH significant for SBO 2011, leiomyosarcoma, diverticulitis, diverticuosis, CA, L THR.    PT Comments    Patient progressing with mobility this session. Patient ambulated 6' with minA+2 and HHAx2, short steps noted due to fear of falling. Reports of feeling weak throughout but easily encouraged to perform mobility. Patient continues to demonstrate L inattention but will attend to L with cueing. Disoriented to situation this session. Continue to recommend SNF for ongoing Physical Therapy.       Follow Up Recommendations  SNF;Supervision/Assistance - 24 hour     Equipment Recommendations  Rolling Ancel Easler with 5" wheels;Wheelchair (measurements PT);Wheelchair cushion (measurements PT)    Recommendations for Other Services       Precautions / Restrictions Precautions Precautions: Fall Restrictions Weight Bearing Restrictions: No    Mobility  Bed Mobility Overal bed mobility: Needs Assistance Bed Mobility: Supine to Sit;Sit to Supine     Supine to sit: Min assist Sit to supine: Min guard   General bed mobility comments: Use of therapist arm for assistance to reach EOB. Able to return to supine with min guard for safety.    Transfers Overall transfer level: Needs assistance Equipment used: 2 person hand held assist Transfers: Sit to/from Stand Sit to Stand: Min assist;+2 physical assistance;+2 safety/equipment         General transfer comment: minA+2 for sit to stand to power up and steadying  Ambulation/Gait Ambulation/Gait assistance: Min assist;+2 physical assistance;+2 safety/equipment Gait Distance (Feet): 6 Feet Assistive device: 2 person hand held assist Gait  Pattern/deviations: Step-to pattern;Decreased stride length;Narrow base of support;Decreased step length - left;Decreased step length - right Gait velocity: decreased.   General Gait Details: able to take steps forward, backward, and sideways with HHAx2 and minA+2 for balance   Stairs             Wheelchair Mobility    Modified Rankin (Stroke Patients Only) Modified Rankin (Stroke Patients Only) Pre-Morbid Rankin Score: No significant disability Modified Rankin: Moderately severe disability     Balance Overall balance assessment: Needs assistance Sitting-balance support: Feet supported;No upper extremity supported Sitting balance-Leahy Scale: Fair Sitting balance - Comments: able to sit with no UE support and brush teeth   Standing balance support: Bilateral upper extremity supported;During functional activity Standing balance-Leahy Scale: Poor Standing balance comment: reliant on UE support and external assist                            Cognition Arousal/Alertness: Awake/alert Behavior During Therapy: Flat affect Overall Cognitive Status: Impaired/Different from baseline Area of Impairment: Orientation;Attention;Memory;Following commands;Awareness;Safety/judgement;Problem solving                 Orientation Level: Disoriented to;Situation Current Attention Level: Sustained Memory: Decreased short-term memory Following Commands: Follows one step commands inconsistently;Follows one step commands with increased time Safety/Judgement: Decreased awareness of safety;Decreased awareness of deficits Awareness: Intellectual Problem Solving: Slow processing;Decreased initiation;Requires verbal cues;Difficulty sequencing;Requires tactile cues General Comments: Increased time required to follow 1 step commands. Difficult attending to tasks      Exercises      General Comments        Pertinent Vitals/Pain Pain Assessment: Faces Faces Pain  Scale: No  hurt    Home Living                      Prior Function            PT Goals (current goals can now be found in the care plan section) Acute Rehab PT Goals Patient Stated Goal: Pt did not state goals. PT Goal Formulation: Patient unable to participate in goal setting Time For Goal Achievement: 08/01/20 Potential to Achieve Goals: Good Progress towards PT goals: Progressing toward goals    Frequency    Min 3X/week      PT Plan Current plan remains appropriate    Co-evaluation              AM-PAC PT "6 Clicks" Mobility   Outcome Measure  Help needed turning from your back to your side while in a flat bed without using bedrails?: A Little Help needed moving from lying on your back to sitting on the side of a flat bed without using bedrails?: A Little Help needed moving to and from a bed to a chair (including a wheelchair)?: A Little Help needed standing up from a chair using your arms (e.g., wheelchair or bedside chair)?: A Little Help needed to walk in hospital room?: A Little Help needed climbing 3-5 steps with a railing? : A Lot 6 Click Score: 17    End of Session Equipment Utilized During Treatment: Gait belt Activity Tolerance: Patient tolerated treatment well Patient left: in bed;with call bell/phone within reach;with bed alarm set;with family/visitor present Nurse Communication: Mobility status PT Visit Diagnosis: Unsteadiness on feet (R26.81);Other abnormalities of gait and mobility (R26.89);Repeated falls (R29.6);Muscle weakness (generalized) (M62.81);History of falling (Z91.81);Difficulty in walking, not elsewhere classified (R26.2);Ataxic gait (R26.0);Adult, failure to thrive (R62.7)     Time: 0929-1002 PT Time Calculation (min) (ACUTE ONLY): 33 min  Charges:  $Gait Training: 8-22 mins                     Rosaleen Mazer A. Gilford Rile PT, DPT Acute Rehabilitation Services Pager [phone] Office [phone]    Linna Hoff 07/20/2020, 11:53  AM

## 2020-07-20 NOTE — Discharge Summary (Addendum)
Physician Discharge Summary  Monica Ward BHA:[id number] DOB: [birth year] DOA: 07/17/2020  PCP: Horald Pollen, MD  Admit date: 07/17/2020 Discharge date: 07/20/2020  Admitted From: Home Disposition:  SNF  Recommendations for Outpatient Follow-up:  1. Follow up with [facility] in 1-2 weeks 2. Please obtain BMP/CBC in one week   Home Health:no Equipment/Devices:none  Discharge Condition:STable CODE STATUS:Full Diet recommendation: Heart Healthy  Brief/Interim Summary: 87 y.o. female with past medical history of hyperlipidemia, small bowel obstruction leiomyosarcoma presents to the ED for evaluation after she was found down at home most of the history was obtained from the ED note in sinus the patient cannot provide history.  Was found on the day of admission in the bathroom was transferred to the ED was found lethargic and with a dysarthric speech, was found to have an acute to subacute right MCA CVA  Significant studies: 07/17/2020 CT of the head showed no acute abnormalities. 07/17/2020 MRI of the brain showed an acute right MCA infarct 07/17/2020 showed nearly occlusive thrombus within the proximal posterior division of the right MCA  Discharge Diagnoses:  Principal Problem:   Acute ischemic right MCA stroke [facility]) Active Problems:   Acute lower UTI   CVA (cerebral vascular accident) ([place]) Acute ischemic right MCA stroke: With an A1c of 5.6, fasting lipid panel showed an HDL greater than 40 LDL greater than 150 she was started on Lipitor. MRI showed an acute stroke. 2D echo showed an EF of 60% with grade 1 diastolic heart failure. Admission she was started on aspirin and Plavix. Overnight she developed paroxysmal atrial fibrillation and will switch to oral Eliquis. She was allowed permissive hypertension. Lower extremity Doppler was negative for DVT.  Possible UTI: She was started on Rocephin urine cultures were positive, she was started on oral Keflex which should  continue as an outpatient.  Newly diagnosed paroxysmal atrial fibrillation: With a chads Vascor greater than 6. After second day in the hospital she developed paroxysmal atrial fibrillation overnight, she was started on oral Eliquis which she continues an outpatient.   Discharge Instructions  Discharge Instructions    Ambulatory referral to Neurology   Complete by: As directed    Follow up with stroke clinic NP (Jessica Vanschaick or Cecille Rubin, if both not available, consider Zachery Dauer, or A[place]) at [facility] in about 4 weeks. Thanks.   Diet - low sodium heart healthy   Complete by: As directed    Increase activity slowly   Complete by: As directed      Allergies as of 07/20/2020   No Known Allergies     Medication List    TAKE these medications   apixaban 2.5 MG Tabs tablet Commonly known as: ELIQUIS Take 1 tablet (2.5 mg total) by mouth 2 (two) times daily.   aspirin EC 81 MG tablet Take 81 mg by mouth every 4 (four) hours as needed for mild pain.   atorvastatin 80 MG tablet Commonly known as: LIPITOR Take 1 tablet (80 mg total) by mouth daily.   cephALEXin 500 MG capsule Commonly known as: KEFLEX Take 1 capsule (500 mg total) by mouth every 12 (twelve) hours for 2 days.   metoprolol tartrate 25 MG tablet Commonly known as: LOPRESSOR Take 0.5 tablets (12.5 mg total) by mouth 2 (two) times daily.       Contact information for follow-up providers    [facility]. Schedule an appointment as soon as possible for a visit in 4 week(s).   Specialty: Neurology Contact  information: [address] [place] [phone]       Sande Rives E, PA-C Follow up.   Specialties: Physician Assistant, Cardiology Why: Hospital follow-up with Cardiology scheduled for 08/15/2020 at 3:45pm. Please arrive 15 minutes early for check-in. If this date/time does not work for you, please call our office to reschedule. Contact  information: [place] [address] [phone]            Contact information for after-discharge care    Destination    [facility] .   Service: Skilled Nursing Contact information: [address] [place] [phone]                 No Known Allergies  Consultations:  Neurology  Cardiology   Procedures/Studies: CT ANGIO HEAD W OR WO CONTRAST  Result Date: 07/17/2020 CLINICAL DATA:  Infarcts on MRI EXAM: CT ANGIOGRAPHY HEAD AND NECK TECHNIQUE: Multidetector CT imaging of the head and neck was performed using the standard protocol during bolus administration of intravenous contrast. Multiplanar CT image reconstructions and MIPs were obtained to evaluate the vascular anatomy. Carotid stenosis measurements (when applicable) are obtained utilizing NASCET criteria, using the distal internal carotid diameter as the denominator. CONTRAST:  5mL OMNIPAQUE IOHEXOL 350 MG/ML SOLN COMPARISON:  Correlation made with same day brain FINDINGS: CTA NECK Aortic arch: Great vessel origins are patent. Right carotid system: Patent. Mild calcified plaque at the ICA origin causing minimal stenosis. Left carotid system: Patent. Minimal calcified plaque at the ICA origin without stenosis. Vertebral arteries: Patent and codominant. Skeleton: Degenerative changes of the cervical spine. Other neck: Negative. Upper chest: Included upper lungs are clear. Review of the MIP images confirms the above findings CTA HEAD Anterior circulation: Intracranial internal carotid arteries are patent with mild calcified plaque. Right M1 MCA is patent. There is nearly occlusive thrombus within the proximal posterior division resulting in diminished opacification of distal vessels. Left middle and both anterior cerebral arteries are patent. Posterior circulation: Intracranial vertebral arteries, basilar artery, and posterior cerebral arteries are  patent. Left posterior communicating artery is present. Moderate stenosis at the proximal right P2 PCA. Venous sinuses: Patent as allowed by contrast bolus timing. Review of the MIP images confirms the above findings IMPRESSION: Nearly occlusive thrombus within the proximal posterior division of the right MCA. Diminished flow within more distal vessels. Moderate stenosis proximal right P2 PCA. No significant stenosis in the neck. Primary result called by telephone at the time of interpretation on 07/17/2020 at 8:40 pm to provider Pecolia Ades, who verbally acknowledged these results. Electronically Signed   By: Macy Mis M.D.   On: 07/17/2020 20:46   DG Pelvis 1-2 Views  Result Date: 07/17/2020 CLINICAL DATA:  Fall. EXAM: PELVIS - 1-2 VIEW COMPARISON:  January 31, 2015. FINDINGS: Status post left hip arthroplasty. No fracture or dislocation is noted. Mild degenerative changes seen involving the right hip. IMPRESSION: No acute abnormality seen in the pelvis. Electronically Signed   By: Marijo Conception M.D.   On: 07/17/2020 13:58   DG Ankle Complete Left  Result Date: 07/17/2020 CLINICAL DATA:  Fall. EXAM: LEFT ANKLE COMPLETE - 3+ VIEW COMPARISON:  None. FINDINGS: There is no evidence of fracture, dislocation, or joint effusion. There is no evidence of arthropathy or other focal bone abnormality. Soft tissues are unremarkable. IMPRESSION: Negative. Electronically Signed   By: Marijo Conception M.D.   On: 07/17/2020 14:01   DG  Ankle Complete Right  Result Date: 07/17/2020 CLINICAL DATA:  Fall. EXAM: RIGHT ANKLE - COMPLETE 3+ VIEW COMPARISON:  None. FINDINGS: There is no evidence of fracture, dislocation, or joint effusion. There is no evidence of arthropathy or other focal bone abnormality. Soft tissues are unremarkable. IMPRESSION: Negative. Electronically Signed   By: Marijo Conception M.D.   On: 07/17/2020 14:01   DG Abdomen 1 View  Result Date: 07/17/2020 CLINICAL DATA:  Fall. EXAM: ABDOMEN - 1  VIEW COMPARISON:  February 04, 2014. FINDINGS: The bowel gas pattern is normal. No radio-opaque calculi or other significant radiographic abnormality are seen. Surgical clips are seen in the pelvis. IMPRESSION: No evidence of bowel obstruction or ileus. Aortic Atherosclerosis (ICD10-I70.0). Electronically Signed   By: Marijo Conception M.D.   On: 07/17/2020 14:00   CT Head Wo Contrast  Result Date: 07/17/2020 CLINICAL DATA:  Status post fall.  Initial encounter. EXAM: CT HEAD WITHOUT CONTRAST CT CERVICAL SPINE WITHOUT CONTRAST TECHNIQUE: Multidetector CT imaging of the head and cervical spine was performed following the standard protocol without intravenous contrast. Multiplanar CT image reconstructions of the cervical spine were also generated. COMPARISON:  None. FINDINGS: CT HEAD FINDINGS Brain: No evidence of acute infarction, hemorrhage, hydrocephalus, extra-axial collection or mass lesion/mass effect. Atrophy and chronic microvascular ischemic change noted. Vascular: No hyperdense vessel or unexpected calcification. Skull: No fracture or focal lesion. Sinuses/Orbits: Motion limits evaluation. No abnormality is seen. The patient is status post cataract surgery Other: None. CT CERVICAL SPINE FINDINGS Alignment: Trace anterolisthesis C3 on C4. Straightening of lordosis. Skull base and vertebrae: No acute fracture. No primary bone lesion or focal pathologic process. Soft tissues and spinal canal: No prevertebral fluid or swelling. No visible canal hematoma. Disc levels: Mild loss of disc space height and endplate spurring are seen at C5-6. Upper chest: Clear. Other: None. IMPRESSION: No acute abnormality head or cervical spine. Cortical atrophy and chronic microvascular ischemic change. Mild degenerative disc disease C5-6. Electronically Signed   By: Inge Rise M.D.   On: 07/17/2020 14:42   CT ANGIO NECK W OR WO CONTRAST  Result Date: 07/17/2020 CLINICAL DATA:  Infarcts on MRI EXAM: CT ANGIOGRAPHY HEAD  AND NECK TECHNIQUE: Multidetector CT imaging of the head and neck was performed using the standard protocol during bolus administration of intravenous contrast. Multiplanar CT image reconstructions and MIPs were obtained to evaluate the vascular anatomy. Carotid stenosis measurements (when applicable) are obtained utilizing NASCET criteria, using the distal internal carotid diameter as the denominator. CONTRAST:  44mL OMNIPAQUE IOHEXOL 350 MG/ML SOLN COMPARISON:  Correlation made with same day brain FINDINGS: CTA NECK Aortic arch: Great vessel origins are patent. Right carotid system: Patent. Mild calcified plaque at the ICA origin causing minimal stenosis. Left carotid system: Patent. Minimal calcified plaque at the ICA origin without stenosis. Vertebral arteries: Patent and codominant. Skeleton: Degenerative changes of the cervical spine. Other neck: Negative. Upper chest: Included upper lungs are clear. Review of the MIP images confirms the above findings CTA HEAD Anterior circulation: Intracranial internal carotid arteries are patent with mild calcified plaque. Right M1 MCA is patent. There is nearly occlusive thrombus within the proximal posterior division resulting in diminished opacification of distal vessels. Left middle and both anterior cerebral arteries are patent. Posterior circulation: Intracranial vertebral arteries, basilar artery, and posterior cerebral arteries are patent. Left posterior communicating artery is present. Moderate stenosis at the proximal right P2 PCA. Venous sinuses: Patent as allowed by contrast bolus timing. Review of the MIP  images confirms the above findings IMPRESSION: Nearly occlusive thrombus within the proximal posterior division of the right MCA. Diminished flow within more distal vessels. Moderate stenosis proximal right P2 PCA. No significant stenosis in the neck. Primary result called by telephone at the time of interpretation on 07/17/2020 at 8:40 pm to provider Pecolia Ades, who verbally acknowledged these results. Electronically Signed   By: Macy Mis M.D.   On: 07/17/2020 20:46   CT Cervical Spine Wo Contrast  Result Date: 07/17/2020 CLINICAL DATA:  Status post fall.  Initial encounter. EXAM: CT HEAD WITHOUT CONTRAST CT CERVICAL SPINE WITHOUT CONTRAST TECHNIQUE: Multidetector CT imaging of the head and cervical spine was performed following the standard protocol without intravenous contrast. Multiplanar CT image reconstructions of the cervical spine were also generated. COMPARISON:  None. FINDINGS: CT HEAD FINDINGS Brain: No evidence of acute infarction, hemorrhage, hydrocephalus, extra-axial collection or mass lesion/mass effect. Atrophy and chronic microvascular ischemic change noted. Vascular: No hyperdense vessel or unexpected calcification. Skull: No fracture or focal lesion. Sinuses/Orbits: Motion limits evaluation. No abnormality is seen. The patient is status post cataract surgery Other: None. CT CERVICAL SPINE FINDINGS Alignment: Trace anterolisthesis C3 on C4. Straightening of lordosis. Skull base and vertebrae: No acute fracture. No primary bone lesion or focal pathologic process. Soft tissues and spinal canal: No prevertebral fluid or swelling. No visible canal hematoma. Disc levels: Mild loss of disc space height and endplate spurring are seen at C5-6. Upper chest: Clear. Other: None. IMPRESSION: No acute abnormality head or cervical spine. Cortical atrophy and chronic microvascular ischemic change. Mild degenerative disc disease C5-6. Electronically Signed   By: Inge Rise M.D.   On: 07/17/2020 14:42   MR BRAIN WO CONTRAST  Result Date: 07/17/2020 CLINICAL DATA:  Unwitnessed fall, questionable left arm/grip weakness EXAM: MRI HEAD WITHOUT CONTRAST TECHNIQUE: Multiplanar, multiecho pulse sequences of the brain and surrounding structures were obtained without intravenous contrast. COMPARISON:  None. FINDINGS: Motion degraded DWI and sagittal T1  sequences were obtained. Patient could not tolerate remainder of the study. There is patchy diffusion hyperintensity in the right frontal, parietal, and temporal lobes. IMPRESSION: Two sequences performed with significant motion degradation. Likely acute to subacute right MCA territory infarcts. Electronically Signed   By: Macy Mis M.D.   On: 07/17/2020 17:35   ECHOCARDIOGRAM COMPLETE  Result Date: 07/18/2020    ECHOCARDIOGRAM REPORT   Patient Name:   Monica Ward [facility] Date of Exam: 07/18/2020 Medical Rec #:  [id number]          Height:       64.0 in Accession #:    [id number]         Weight:       108.9 lb Date of Birth:  06-Jun-1929           BSA:          1.511 m Patient Age:    61 years           BP:           152/73 mmHg Patient Gender: F                  HR:           57 bpm. Exam Location:  Inpatient Procedure: 2D Echo Indications:    Stroke I63.9  History:        Patient has no prior history of Echocardiogram examinations.  Sonographer:    Mikki Santee RDCS (AE) Referring Phys: [id number] Roney Mans  TO[place]MAN IMPRESSIONS  1. Left ventricular ejection fraction, by estimation, is 60 to 65%. The left ventricle has normal function. The left ventricle has no regional wall motion abnormalities. Left ventricular diastolic parameters are consistent with Grade II diastolic dysfunction (pseudonormalization). Elevated left atrial pressure.  2. Right ventricular systolic function is normal. The right ventricular size is normal. There is mildly elevated pulmonary artery systolic pressure. The estimated right ventricular systolic pressure is 85.8 mmHg.  3. Left atrial size was moderately dilated.  4. The mitral valve is normal in structure. No evidence of mitral valve regurgitation.  5. The aortic valve is tricuspid. Aortic valve regurgitation is not visualized. Mild aortic valve sclerosis is present, with no evidence of aortic valve stenosis.  6. The inferior vena cava is dilated in size with >50% respiratory  variability, suggesting right atrial pressure of 8 mmHg. FINDINGS  Left Ventricle: Left ventricular ejection fraction, by estimation, is 60 to 65%. The left ventricle has normal function. The left ventricle has no regional wall motion abnormalities. The left ventricular internal cavity size was normal in size. There is  no left ventricular hypertrophy. Left ventricular diastolic parameters are consistent with Grade II diastolic dysfunction (pseudonormalization). Elevated left atrial pressure. Right Ventricle: The right ventricular size is normal. No increase in right ventricular wall thickness. Right ventricular systolic function is normal. There is mildly elevated pulmonary artery systolic pressure. The tricuspid regurgitant velocity is 2.70  m/s, and with an assumed right atrial pressure of 8 mmHg, the estimated right ventricular systolic pressure is 85.0 mmHg. Left Atrium: Left atrial size was moderately dilated. Right Atrium: Right atrial size was normal in size. Pericardium: There is no evidence of pericardial effusion. Mitral Valve: The mitral valve is normal in structure. No evidence of mitral valve regurgitation. Tricuspid Valve: The tricuspid valve is normal in structure. Tricuspid valve regurgitation is trivial. Aortic Valve: The aortic valve is tricuspid. Aortic valve regurgitation is not visualized. Mild aortic valve sclerosis is present, with no evidence of aortic valve stenosis. Pulmonic Valve: The pulmonic valve was not well visualized. Pulmonic valve regurgitation is trivial. Aorta: The aortic root is normal in size and structure. Venous: The inferior vena cava is dilated in size with greater than 50% respiratory variability, suggesting right atrial pressure of 8 mmHg. IAS/Shunts: The interatrial septum was not well visualized.  LEFT VENTRICLE PLAX 2D LVIDd:         4.20 cm  Diastology LVIDs:         3.10 cm  LV e' medial:    6.20 cm/s LV PW:         0.80 cm  LV E/e' medial:  19.5 LV IVS:        0.90  cm  LV e' lateral:   7.51 cm/s LVOT diam:     2.00 cm  LV E/e' lateral: 16.1 LV SV:         84 LV SV Index:   56 LVOT Area:     3.14 cm  RIGHT VENTRICLE RV S prime:     12.80 cm/s TAPSE (M-mode): 2.6 cm LEFT ATRIUM             Index       RIGHT ATRIUM          Index LA diam:        3.30 cm 2.18 cm/m  RA Area:     7.71 cm LA Vol (A2C):   65.8 ml 43.55 ml/m RA Volume:   14.40 ml 9.53  ml/m LA Vol (A4C):   67.5 ml 44.67 ml/m LA Biplane Vol: 73.1 ml 48.38 ml/m  AORTIC VALVE LVOT Vmax:   105.00 cm/s LVOT Vmean:  70.100 cm/s LVOT VTI:    0.268 m  AORTA Ao Root diam: 3.30 cm MITRAL VALVE                TRICUSPID VALVE MV Area (PHT): 2.16 cm     TR Peak grad:   29.2 mmHg MV Decel Time: 352 msec     TR Vmax:        270.00 cm/s MV E velocity: 121.00 cm/s MV A velocity: 101.00 cm/s  SHUNTS MV E/A ratio:  1.20         Systemic VTI:  0.27 m                             Systemic Diam: 2.00 cm Oswaldo Milian MD Electronically signed by Oswaldo Milian MD Signature Date/Time: 07/18/2020/3:25:17 PM    Final    VAS [place] LOWER EXTREMITY VENOUS (DVT)  Result Date: 07/18/2020  Lower Venous DVT Study Indications: Embolic stroke in cancer patient.  Comparison Study: No prior studies. Performing Technologist: Darlin Coco RDMS,RVT  Examination Guidelines: A complete evaluation includes B-mode imaging, spectral Doppler, color Doppler, and power Doppler as needed of all accessible portions of each vessel. Bilateral testing is considered an integral part of a complete examination. Limited examinations for reoccurring indications may be performed as noted. The reflux portion of the exam is performed with the patient in reverse Trendelenburg.  +---------+---------------+---------+-----------+----------+--------------+ RIGHT    CompressibilityPhasicitySpontaneityPropertiesThrombus Aging +---------+---------------+---------+-----------+----------+--------------+ CFV      Full           Yes      Yes                                  +---------+---------------+---------+-----------+----------+--------------+ SFJ      Full                                                        +---------+---------------+---------+-----------+----------+--------------+ FV Prox  Full                                                        +---------+---------------+---------+-----------+----------+--------------+ FV Mid   Full                                                        +---------+---------------+---------+-----------+----------+--------------+ FV DistalFull                                                        +---------+---------------+---------+-----------+----------+--------------+ PFV      Full                                                        +---------+---------------+---------+-----------+----------+--------------+  POP      Full           Yes      Yes                                 +---------+---------------+---------+-----------+----------+--------------+ PTV      Full                                                        +---------+---------------+---------+-----------+----------+--------------+ PERO     Full                                                        +---------+---------------+---------+-----------+----------+--------------+   +---------+---------------+---------+-----------+----------+--------------+ LEFT     CompressibilityPhasicitySpontaneityPropertiesThrombus Aging +---------+---------------+---------+-----------+----------+--------------+ CFV      Full           Yes      Yes                                 +---------+---------------+---------+-----------+----------+--------------+ SFJ      Full                                                        +---------+---------------+---------+-----------+----------+--------------+ FV Prox  Full                                                         +---------+---------------+---------+-----------+----------+--------------+ FV Mid   Full                                                        +---------+---------------+---------+-----------+----------+--------------+ FV DistalFull                                                        +---------+---------------+---------+-----------+----------+--------------+ PFV      Full                                                        +---------+---------------+---------+-----------+----------+--------------+ POP      Full           Yes      Yes                                 +---------+---------------+---------+-----------+----------+--------------+  PTV      Full                                                        +---------+---------------+---------+-----------+----------+--------------+ PERO     Full                                                        +---------+---------------+---------+-----------+----------+--------------+     Summary: RIGHT: - There is no evidence of deep vein thrombosis in the lower extremity.  - A cystic structure is found in the popliteal fossa.  LEFT: - There is no evidence of deep vein thrombosis in the lower extremity.  - No cystic structure found in the popliteal fossa.  *See table(s) above for measurements and observations. Electronically signed by Curt Jews MD on 07/18/2020 at 2:02:53 PM.    Final     Subjective: No new complaints  Discharge Exam: Vitals:   07/20/20 0550 07/20/20 0721  BP: (!) 123/99 130/67  Pulse:  (!) 49  Resp: 18 16  Temp: (!) 97.5 F (36.4 C) 98.2 F (36.8 C)  SpO2: 98% 98%   Vitals:   07/19/20 1533 07/19/20 2008 07/20/20 0550 07/20/20 0721  BP: (!) 141/86 123/62 (!) 123/99 130/67  Pulse: 93 (!) 53  (!) 49  Resp: 18 14 18 16   Temp: 97.9 F (36.6 C) 97.8 F (36.6 C) (!) 97.5 F (36.4 C) 98.2 F (36.8 C)  TempSrc: Oral Oral Oral Oral  SpO2: 94% 98% 98% 98%  Weight:      Height:         General: Pt is alert, awake, not in acute distress Cardiovascular: RRR, S1/S2 +, no rubs, no gallops Respiratory: CTA bilaterally, no wheezing, no rhonchi Abdominal: Soft, NT, ND, bowel sounds + Extremities: no edema, no cyanosis    The results of significant diagnostics from this hospitalization (including imaging, microbiology, ancillary and laboratory) are listed below for reference.     Microbiology: Recent Results (from the past 240 hour(s))  Urine culture     Status: Abnormal   Collection Time: 07/17/20 12:53 PM   Specimen: Urine, Random  Result Value Ref Range Status   Specimen Description URINE, RANDOM  Final   Special Requests   Final    NONE Performed at [facility], [address], [place],  [address]    Culture >=100,000 COLONIES/mL KLEBSIELLA PNEUMONIAE (A)  Final   Report Status 07/19/2020 FINAL  Final   Organism ID, Bacteria KLEBSIELLA PNEUMONIAE (A)  Final      Susceptibility   Klebsiella pneumoniae - MIC*    AMPICILLIN >=32 RESISTANT Resistant     CEFAZOLIN <=4 SENSITIVE Sensitive     CEFEPIME <=0.12 SENSITIVE Sensitive     CEFTRIAXONE <=0.25 SENSITIVE Sensitive     CIPROFLOXACIN <=0.25 SENSITIVE Sensitive     GENTAMICIN <=1 SENSITIVE Sensitive     IMIPENEM <=0.25 SENSITIVE Sensitive     NITROFURANTOIN 64 INTERMEDIATE Intermediate     TRIMETH/SULFA <=20 SENSITIVE Sensitive     AMPICILLIN/SULBACTAM <=2 SENSITIVE Sensitive     PIP/TAZO <=4 SENSITIVE Sensitive     * >=100,000 COLONIES/mL KLEBSIELLA PNEUMONIAE  SARS  CORONAVIRUS 2 (TAT 6-24 HRS)     Status: None   Collection Time: 07/19/20  4:30 PM  Result Value Ref Range Status   SARS Coronavirus 2 NEGATIVE NEGATIVE Final    Comment: (NOTE) SARS-CoV-2 target nucleic acids are NOT DETECTED.  The SARS-CoV-2 RNA is generally detectable in upper and lower respiratory specimens during the acute phase of infection. Negative results do not preclude SARS-CoV-2 infection, do not rule  out co-infections with other pathogens, and should not be used as the sole basis for treatment or other patient management decisions. Negative results must be combined with clinical observations, patient history, and epidemiological information. The expected result is Negative.  Fact Sheet for Patients: SugarRoll.be  Fact Sheet for Healthcare Providers: https://www.woods-mathews.com/  This test is not yet approved or cleared by the [place] FDA and  has been authorized for detection and/or diagnosis of SARS-CoV-2 by FDA under an Emergency Use Authorization (EUA). This EUA will remain  in effect (meaning this test can be used) for the duration of the COVID-19 declaration under Se ction 564(b)(1) of the Act, 21 U.S.C. section 360bbb-3(b)(1), unless the authorization is terminated or revoked sooner.  Performed at [facility], [place] [address], [place], [place] [address]      Labs: BNP (last 3 results) No results for input(s): BNP in the last 8760 hours. Basic Metabolic Panel: Recent Labs  Lab 07/17/20 1222 07/19/20 1835  NA 141  --   K 3.6  --   CL 107  --   CO2 26  --   GLUCOSE 97  --   BUN 21  --   CREATININE 0.73  --   CALCIUM 9.5  --   MG  --  2.1   Liver Function Tests: Recent Labs  Lab 07/17/20 1222  AST 23  ALT 16  ALKPHOS 55  BILITOT 0.4  PROT 6.9  ALBUMIN 3.9   Recent Labs  Lab 07/17/20 1222  LIPASE 27   No results for input(s): AMMONIA in the last 168 hours. CBC: Recent Labs  Lab 07/17/20 1222  WBC 8.4  NEUTROABS 6.8  HGB 14.9  HCT 44.1  MCV 92.6  PLT 319   Cardiac Enzymes: Recent Labs  Lab 07/17/20 1222  CKTOTAL 81   BNP: Invalid input(s): POCBNP CBG: No results for input(s): GLUCAP in the last 168 hours. D-Dimer No results for input(s): DDIMER in the last 72 hours. Hgb A1c Recent Labs    07/17/20 1222  HGBA1C 5.6   Lipid Profile Recent Labs    07/17/20 1222  CHOL  243*  HDL 68  LDLCALC 160*  TRIG 76  CHOLHDL 3.6   Thyroid function studies Recent Labs    07/19/20 1835  TSH 2.386   Anemia work up No results for input(s): VITAMINB12, FOLATE, FERRITIN, TIBC, IRON, RETICCTPCT in the last 72 hours. Urinalysis    Component Value Date/Time   COLORURINE YELLOW 07/17/2020 1253   APPEARANCEUR CLEAR 07/17/2020 1253   LABSPEC 1.008 07/17/2020 1253   PHURINE 6.0 07/17/2020 1253   GLUCOSEU NEGATIVE 07/17/2020 1253   HGBUR SMALL (A) 07/17/2020 1253   BILIRUBINUR NEGATIVE 07/17/2020 1253   BILIRUBINUR negative 01/01/2016 0950   BILIRUBINUR neg 12/27/2014 0952   KETONESUR NEGATIVE 07/17/2020 1253   PROTEINUR NEGATIVE 07/17/2020 1253   UROBILINOGEN 0.2 01/01/2016 0950   UROBILINOGEN 1.0 01/31/2015 1140   NITRITE NEGATIVE 07/17/2020 1253   LEUKOCYTESUR SMALL (A) 07/17/2020 1253   Sepsis Labs Invalid input(s): PROCALCITONIN,  WBC,  LACTICIDVEN  Microbiology Recent Results (from the past 240 hour(s))  Urine culture     Status: Abnormal   Collection Time: 07/17/20 12:53 PM   Specimen: Urine, Random  Result Value Ref Range Status   Specimen Description URINE, RANDOM  Final   Special Requests   Final    NONE Performed at [facility], [address] [place], [place] [address]    Culture >=100,000 COLONIES/mL KLEBSIELLA PNEUMONIAE (A)  Final   Report Status 07/19/2020 FINAL  Final   Organism ID, Bacteria KLEBSIELLA PNEUMONIAE (A)  Final      Susceptibility   Klebsiella pneumoniae - MIC*    AMPICILLIN >=32 RESISTANT Resistant     CEFAZOLIN <=4 SENSITIVE Sensitive     CEFEPIME <=0.12 SENSITIVE Sensitive     CEFTRIAXONE <=0.25 SENSITIVE Sensitive     CIPROFLOXACIN <=0.25 SENSITIVE Sensitive     GENTAMICIN <=1 SENSITIVE Sensitive     IMIPENEM <=0.25 SENSITIVE Sensitive     NITROFURANTOIN 64 INTERMEDIATE Intermediate     TRIMETH/SULFA <=20 SENSITIVE Sensitive     AMPICILLIN/SULBACTAM <=2 SENSITIVE Sensitive     PIP/TAZO <=4 SENSITIVE  Sensitive     * >=100,000 COLONIES/mL KLEBSIELLA PNEUMONIAE  SARS CORONAVIRUS 2 (TAT 6-24 HRS)     Status: None   Collection Time: 07/19/20  4:30 PM  Result Value Ref Range Status   SARS Coronavirus 2 NEGATIVE NEGATIVE Final    Comment: (NOTE) SARS-CoV-2 target nucleic acids are NOT DETECTED.  The SARS-CoV-2 RNA is generally detectable in upper and lower respiratory specimens during the acute phase of infection. Negative results do not preclude SARS-CoV-2 infection, do not rule out co-infections with other pathogens, and should not be used as the sole basis for treatment or other patient management decisions. Negative results must be combined with clinical observations, patient history, and epidemiological information. The expected result is Negative.  Fact Sheet for Patients: SugarRoll.be  Fact Sheet for Healthcare Providers: https://www.woods-mathews.com/  This test is not yet approved or cleared by the [place] FDA and  has been authorized for detection and/or diagnosis of SARS-CoV-2 by FDA under an Emergency Use Authorization (EUA). This EUA will remain  in effect (meaning this test can be used) for the duration of the COVID-19 declaration under Se ction 564(b)(1) of the Act, 21 U.S.C. section 360bbb-3(b)(1), unless the authorization is terminated or revoked sooner.  Performed at [facility], [place] [address] [place], [place] [address]      Time coordinating discharge: Over 30 minutes  SIGNED:   Charlynne Cousins, MD  [facility] 07/20/2020, 12:16 PM Pager   If 7PM-7AM, please contact night-coverage [email or web address] Password [email or web address]

## 2020-07-20 NOTE — TOC Transition Note (Signed)
Transition of Care [facility]) - CM/SW Discharge Note   Patient Details  Name: Monica Ward MRN: [id number] Date of Birth: 06/09/1929  Transition of Care [facility]) CM/SW Contact:  Geralynn Ochs, LCSW Phone Number: 07/20/2020, 11:59 AM   Clinical Narrative:   Nurse to call report to [phone].    Final next level of care: Skilled Nursing Facility Barriers to Discharge: Barriers Resolved   Patient Goals and CMS Choice Patient states their goals for this hospitalization and ongoing recovery are:: Unable to assess CMS Medicare.gov Compare Post Acute Care list provided to:: Patient Represenative (must comment) Choice offered to / list presented to : Adult Children  Discharge Placement              Patient chooses bed at: [facility] Patient to be transferred to facility by: [facility] Name of family member notified: Shanon Brow Patient and family notified of of transfer: 07/20/20  Discharge Plan and Services     Post Acute Care Choice: [facility]                               Social Determinants of Health (SDOH) Interventions     Readmission Risk Interventions No flowsheet data found.

## 2020-07-20 NOTE — Care Management Important Message (Signed)
Important Message  Patient Details  Name: Monica Ward MRN: [id number] Date of Birth: June 03, 1929   Medicare Important Message Given:  Yes   Patient left prior to IM delivery.  IM mailed to the patient home address.   [name] 07/20/2020, 2:57 PM

## 2020-07-20 NOTE — Progress Notes (Addendum)
Progress Note  Patient Name: Monica Ward Date of Encounter: 07/20/2020  [facility] Cardiologist: Elouise Munroe, MD   Subjective   No acute overnight events. Patient continues to go in and out of atrial fibrillation. Currently in atrial fibrillation with rates in the 110's to 120's (rates in the 60's when in sinus rhythm). Patient still has some confusion. No cardiac complaints. No chest pain, shortness of breath, or palpitations.  Inpatient Medications    Scheduled Meds: . apixaban  2.5 mg Oral BID  . atorvastatin  80 mg Oral Daily  . bacitracin   Topical BID  . cephALEXin  500 mg Oral Q12H   Continuous Infusions: . lactated ringers 100 mL/hr at 07/17/20 1351   PRN Meds: acetaminophen **OR** acetaminophen (TYLENOL) oral liquid 160 mg/5 mL **OR** acetaminophen, haloperidol lactate, labetalol, senna-docusate   Vital Signs    Vitals:   07/19/20 1533 07/19/20 2008 07/20/20 0550 07/20/20 0721  BP: (!) 141/86 123/62 (!) 123/99 130/67  Pulse: 93 (!) 53  (!) 49  Resp: 18 14 18 16   Temp: 97.9 F (36.6 C) 97.8 F (36.6 C) (!) 97.5 F (36.4 C) 98.2 F (36.8 C)  TempSrc: Oral Oral Oral Oral  SpO2: 94% 98% 98% 98%  Weight:      Height:        Intake/Output Summary (Last 24 hours) at 07/20/2020 1027 Last data filed at 07/19/2020 2200 Gross per 24 hour  Intake 360 ml  Output -  Net 360 ml   Last 3 Weights 07/17/2020 12/28/2019 05/02/2019  Weight (lbs) 108 lb 14.5 oz 105 lb 110 lb  Weight (kg) 49.4 kg 47.628 kg 49.896 kg      Telemetry    In and out of atrial fibrillation. When in atrial fibrillation, rates mostly in the 80's to 120's range (but as high as the 140's - suspect this was with activity). When in sinus rhythm, rates in the 60's. - Personally Reviewed  ECG    No new ECG tracing today. - Personally Reviewed  Physical Exam   GEN: Elderly frail Caucasian female. No acute distress.   Neck: No JVD. Cardiac: Tachycardic with irregularly irregular  rhythm. No murmurs, rubs, or gallops. Radial 2+ and equal bilaterally. Respiratory: Clear to auscultation bilaterally. No wheezes, rhonchi, or rales. GI: Soft, non-distended, and non-tender.  MS: Left lower extremity edema around ecchymosis. No deformity. Skin: Warm and dry. Large ecchymosis on left upper and lower extremity from fall. Neuro:  Awake and alert but still has some confusion. Psych: Pleasant affect.  Labs    High Sensitivity Troponin:  No results for input(s): TROPONINIHS in the last 720 hours.    Chemistry Recent Labs  Lab 07/17/20 1222  NA 141  K 3.6  CL 107  CO2 26  GLUCOSE 97  BUN 21  CREATININE 0.73  CALCIUM 9.5  PROT 6.9  ALBUMIN 3.9  AST 23  ALT 16  ALKPHOS 55  BILITOT 0.4  GFRNONAA >60  ANIONGAP 8     Hematology Recent Labs  Lab 07/17/20 1222  WBC 8.4  RBC 4.76  HGB 14.9  HCT 44.1  MCV 92.6  MCH 31.3  MCHC 33.8  RDW 14.4  PLT 319    BNPNo results for input(s): BNP, PROBNP in the last 168 hours.   DDimer No results for input(s): DDIMER in the last 168 hours.   Radiology    ECHOCARDIOGRAM COMPLETE  Result Date: 07/18/2020    ECHOCARDIOGRAM REPORT   Patient Name:  Monica Ward [facility] Date of Exam: 07/18/2020 Medical Rec #:  [id number]          Height:       64.0 in Accession #:    [id number]         Weight:       108.9 lb Date of Birth:  June 08, 1929           BSA:          1.511 m Patient Age:    87 years           BP:           152/73 mmHg Patient Gender: F                  HR:           57 bpm. Exam Location:  Inpatient Procedure: 2D Echo Indications:    Stroke I63.9  History:        Patient has no prior history of Echocardiogram examinations.  Sonographer:    Mikki Santee RDCS (AE) Referring Phys: [id number] [place]  1. Left ventricular ejection fraction, by estimation, is 60 to 65%. The left ventricle has normal function. The left ventricle has no regional wall motion abnormalities. Left ventricular diastolic parameters  are consistent with Grade II diastolic dysfunction (pseudonormalization). Elevated left atrial pressure.  2. Right ventricular systolic function is normal. The right ventricular size is normal. There is mildly elevated pulmonary artery systolic pressure. The estimated right ventricular systolic pressure is 50.5 mmHg.  3. Left atrial size was moderately dilated.  4. The mitral valve is normal in structure. No evidence of mitral valve regurgitation.  5. The aortic valve is tricuspid. Aortic valve regurgitation is not visualized. Mild aortic valve sclerosis is present, with no evidence of aortic valve stenosis.  6. The inferior vena cava is dilated in size with >50% respiratory variability, suggesting right atrial pressure of 8 mmHg. FINDINGS  Left Ventricle: Left ventricular ejection fraction, by estimation, is 60 to 65%. The left ventricle has normal function. The left ventricle has no regional wall motion abnormalities. The left ventricular internal cavity size was normal in size. There is  no left ventricular hypertrophy. Left ventricular diastolic parameters are consistent with Grade II diastolic dysfunction (pseudonormalization). Elevated left atrial pressure. Right Ventricle: The right ventricular size is normal. No increase in right ventricular wall thickness. Right ventricular systolic function is normal. There is mildly elevated pulmonary artery systolic pressure. The tricuspid regurgitant velocity is 2.70  m/s, and with an assumed right atrial pressure of 8 mmHg, the estimated right ventricular systolic pressure is 39.7 mmHg. Left Atrium: Left atrial size was moderately dilated. Right Atrium: Right atrial size was normal in size. Pericardium: There is no evidence of pericardial effusion. Mitral Valve: The mitral valve is normal in structure. No evidence of mitral valve regurgitation. Tricuspid Valve: The tricuspid valve is normal in structure. Tricuspid valve regurgitation is trivial. Aortic Valve: The  aortic valve is tricuspid. Aortic valve regurgitation is not visualized. Mild aortic valve sclerosis is present, with no evidence of aortic valve stenosis. Pulmonic Valve: The pulmonic valve was not well visualized. Pulmonic valve regurgitation is trivial. Aorta: The aortic root is normal in size and structure. Venous: The inferior vena cava is dilated in size with greater than 50% respiratory variability, suggesting right atrial pressure of 8 mmHg. IAS/Shunts: The interatrial septum was not well visualized.  LEFT VENTRICLE PLAX 2D LVIDd:  4.20 cm  Diastology LVIDs:         3.10 cm  LV e' medial:    6.20 cm/s LV PW:         0.80 cm  LV E/e' medial:  19.5 LV IVS:        0.90 cm  LV e' lateral:   7.51 cm/s LVOT diam:     2.00 cm  LV E/e' lateral: 16.1 LV SV:         84 LV SV Index:   56 LVOT Area:     3.14 cm  RIGHT VENTRICLE RV S prime:     12.80 cm/s TAPSE (M-mode): 2.6 cm LEFT ATRIUM             Index       RIGHT ATRIUM          Index LA diam:        3.30 cm 2.18 cm/m  RA Area:     7.71 cm LA Vol (A2C):   65.8 ml 43.55 ml/m RA Volume:   14.40 ml 9.53 ml/m LA Vol (A4C):   67.5 ml 44.67 ml/m LA Biplane Vol: 73.1 ml 48.38 ml/m  AORTIC VALVE LVOT Vmax:   105.00 cm/s LVOT Vmean:  70.100 cm/s LVOT VTI:    0.268 m  AORTA Ao Root diam: 3.30 cm MITRAL VALVE                TRICUSPID VALVE MV Area (PHT): 2.16 cm     TR Peak grad:   29.2 mmHg MV Decel Time: 352 msec     TR Vmax:        270.00 cm/s MV E velocity: 121.00 cm/s MV A velocity: 101.00 cm/s  SHUNTS MV E/A ratio:  1.20         Systemic VTI:  0.27 m                             Systemic Diam: 2.00 cm Oswaldo Milian MD Electronically signed by Oswaldo Milian MD Signature Date/Time: 07/18/2020/3:25:17 PM    Final    VAS [place] LOWER EXTREMITY VENOUS (DVT)  Result Date: 07/18/2020  Lower Venous DVT Study Indications: Embolic stroke in cancer patient.  Comparison Study: No prior studies. Performing Technologist: Darlin Coco RDMS,RVT  Examination  Guidelines: A complete evaluation includes B-mode imaging, spectral Doppler, color Doppler, and power Doppler as needed of all accessible portions of each vessel. Bilateral testing is considered an integral part of a complete examination. Limited examinations for reoccurring indications may be performed as noted. The reflux portion of the exam is performed with the patient in reverse Trendelenburg.  +---------+---------------+---------+-----------+----------+--------------+ RIGHT    CompressibilityPhasicitySpontaneityPropertiesThrombus Aging +---------+---------------+---------+-----------+----------+--------------+ CFV      Full           Yes      Yes                                 +---------+---------------+---------+-----------+----------+--------------+ SFJ      Full                                                        +---------+---------------+---------+-----------+----------+--------------+ FV Prox  Full                                                        +---------+---------------+---------+-----------+----------+--------------+  FV Mid   Full                                                        +---------+---------------+---------+-----------+----------+--------------+ FV DistalFull                                                        +---------+---------------+---------+-----------+----------+--------------+ PFV      Full                                                        +---------+---------------+---------+-----------+----------+--------------+ POP      Full           Yes      Yes                                 +---------+---------------+---------+-----------+----------+--------------+ PTV      Full                                                        +---------+---------------+---------+-----------+----------+--------------+ PERO     Full                                                         +---------+---------------+---------+-----------+----------+--------------+   +---------+---------------+---------+-----------+----------+--------------+ LEFT     CompressibilityPhasicitySpontaneityPropertiesThrombus Aging +---------+---------------+---------+-----------+----------+--------------+ CFV      Full           Yes      Yes                                 +---------+---------------+---------+-----------+----------+--------------+ SFJ      Full                                                        +---------+---------------+---------+-----------+----------+--------------+ FV Prox  Full                                                        +---------+---------------+---------+-----------+----------+--------------+ FV Mid   Full                                                        +---------+---------------+---------+-----------+----------+--------------+  FV DistalFull                                                        +---------+---------------+---------+-----------+----------+--------------+ PFV      Full                                                        +---------+---------------+---------+-----------+----------+--------------+ POP      Full           Yes      Yes                                 +---------+---------------+---------+-----------+----------+--------------+ PTV      Full                                                        +---------+---------------+---------+-----------+----------+--------------+ PERO     Full                                                        +---------+---------------+---------+-----------+----------+--------------+     Summary: RIGHT: - There is no evidence of deep vein thrombosis in the lower extremity.  - A cystic structure is found in the popliteal fossa.  LEFT: - There is no evidence of deep vein thrombosis in the lower extremity.  - No cystic structure found in the popliteal  fossa.  *See table(s) above for measurements and observations. Electronically signed by Curt Jews MD on 07/18/2020 at 2:02:53 PM.    Final     Cardiac Studies   Echocardiogram 07/18/2020: Impressions: 1. Left ventricular ejection fraction, by estimation, is 60 to 65%. The  left ventricle has normal function. The left ventricle has no regional  wall motion abnormalities. Left ventricular diastolic parameters are  consistent with Grade II diastolic  dysfunction (pseudonormalization). Elevated left atrial pressure.  2. Right ventricular systolic function is normal. The right ventricular  size is normal. There is mildly elevated pulmonary artery systolic  pressure. The estimated right ventricular systolic pressure is 62.3 mmHg.  3. Left atrial size was moderately dilated.  4. The mitral valve is normal in structure. No evidence of mitral valve  regurgitation.  5. The aortic valve is tricuspid. Aortic valve regurgitation is not  visualized. Mild aortic valve sclerosis is present, with no evidence of  aortic valve stenosis.  6. The inferior vena cava is dilated in size with >50% respiratory  variability, suggesting right atrial pressure of 8 mmHg.  _______________  Lower Extremity Dopplers 07/18/2020: Summary: RIGHT:  - There is no evidence of deep vein thrombosis in the lower extremity.  - A cystic structure is found in the popliteal fossa.    LEFT:  - There is no evidence of deep vein thrombosis in the lower extremity.  -  No cystic structure found in the popliteal fossa.   Patient Profile     87 y.o. female is a 87 y.o. female with a history of atypical chest pain in 2013 felt to be secondary to costochondritis, hyperlipidemia, chronic constipation with diverticulosis, small bowel obstruction, and leiomyosarcoma, who was admitted with acute CVA on 07/17/2020 and then was noted to go into atrial fibrillation. Cardiology consulted for further evaluation of atrial fibrillation at  the request of Dr. Kai Levins.  Assessment & Plan    New Onset Paroxysmal Atrial Fibrillation - Patient admitted with acute CVA and then found to be in atrial fibrillation on 3/23. Continue to have paroxysmal atrial fibrillation. Currently in atrial fibrillation with rates in the 110's to 120's (as high as the 140's though suspect this was with activity). When in sinus rhythm rates, in the 60's.  - Electrolytes within normal limits.  - TSH normal. - Echo showed normal LV function. - Will start low dose Lopressor 12.5mg  twice daily for some better rate control when in atrial fibrillation. - CHA2DS2-VASc = 6 (HTN, stroke x2, age x2, female). Eliquis 2.5mg  twice daily (reduced dose due to age and weight) started.  Hypertension - No official diagnosis of this prior to admission. BP reasonable well controlled. - Will start low dose beta-blocker as above.  Hyperlipidemia - Lipid panel this admission: Total Cholesterol 243, Triglycerides 76, HDL 68, LDL 160.  - LDL goal <70 given CAD. - Started on Lipitor 40mg  daily this admission. - Will need repeat lipid panel and LFTs in 6-8 weeks.  Acute CVA - Brain MRI consistent with acute infarction of right MCA. - now on eliquis However, will defer this to Neurology. - Management per primary team.  Possible Acute UTI - Empirically on Rocephin. - Management per primary team.  For questions or updates, please contact [place] Please consult [email or web address] for contact info under        Signed, Darreld Mclean, PA-C  07/20/2020, 10:27 AM    Patient seen and examined with Sande Rives PA-C.  Agree as above, with the following exceptions and changes as noted below. Frail elderly women in no distress. CV: iRRR, no murmurs, Lungs: clear, Abd: soft, Extrem: Warm, well perfused, no edema, Neuro/Psych: alert and pleasant. All available labs, radiology testing, previous records reviewed. Agree with adding low dose BB for rate control. AC has  been started. Primary team plans to discharge patient today. Will have follow up in cardiology in 1 mo.   Elouise Munroe, MD 07/20/20 12:53 PM

## 2020-07-20 NOTE — Discharge Instructions (Signed)

## 2020-07-20 NOTE — Progress Notes (Signed)
Occupational Therapy Treatment Patient Details Name: Monica Ward MRN: [id number] DOB: 12-29-1929 Today's Date: 07/20/2020    History of present illness Pt is an 87 y/o female admitted 07/17/20 after being found down at home for unknown amount of time. 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 PMH significant for SBO 2011, leiomyosarcoma, diverticulitis, diverticuosis, CA, L THR.   OT comments  Patient progressing towards goals slowly.  Completes grooming at EOB with min assist, given cueing to sequence.  Min assist +2 for transfers and in room mobility due to balance and fear of falling.  Continues to require total assist for LB self care.  Remains pleasantly confused, but following simple commands with more consistency given increased time.  Continues to presents with some L inattention, but able to read clock once cued on location today.  Will follow acutely.    Follow Up Recommendations  SNF;Supervision/Assistance - 24 hour    Equipment Recommendations  3 in 1 bedside commode    Recommendations for Other Services      Precautions / Restrictions Precautions Precautions: Fall Restrictions Weight Bearing Restrictions: No       Mobility Bed Mobility Overal bed mobility: Needs Assistance Bed Mobility: Supine to Sit;Sit to Supine     Supine to sit: Min assist Sit to supine: Min guard   General bed mobility comments: Use of therapist arm for assistance to reach EOB. Able to return to supine with min guard for safety.    Transfers Overall transfer level: Needs assistance Equipment used: 2 person hand held assist Transfers: Sit to/from Stand Sit to Stand: Min assist;+2 physical assistance;+2 safety/equipment         General transfer comment: minA+2 for sit to stand to power up and steadying    Balance Overall balance assessment: Needs assistance Sitting-balance support: Feet supported;No upper extremity supported Sitting balance-Leahy Scale: Fair Sitting balance -  Comments: able to sit with no UE support and brush teeth   Standing balance support: Bilateral upper extremity supported;During functional activity Standing balance-Leahy Scale: Poor Standing balance comment: reliant on UE support and external assist                           ADL either performed or assessed with clinical judgement   ADL Overall ADL's : Needs assistance/impaired     Grooming: Minimal assistance;Sitting;Wash/dry face;Oral care Grooming Details (indicate cue type and reason): sitting EOB, cueing to sequence             Lower Body Dressing: Total assistance;+2 for physical assistance;+2 for safety/equipment;Sit to/from stand   Toilet Transfer: Minimal assistance;+2 for safety/equipment;+2 for physical assistance;Ambulation Toilet Transfer Details (indicate cue type and reason): simulated in room         Functional mobility during ADLs: Minimal assistance;+2 for physical assistance;+2 for safety/equipment General ADL Comments: B hand held assist, highly fearful of falling; remains limited by weakness, balance and cognition     Vision   Additional Comments: decreased attention to L side visually, but able to scan to locate when cued   Perception     Praxis      Cognition Arousal/Alertness: Awake/alert Behavior During Therapy: Flat affect Overall Cognitive Status: Impaired/Different from baseline Area of Impairment: Orientation;Attention;Memory;Following commands;Awareness;Safety/judgement;Problem solving                 Orientation Level: Disoriented to;Situation Current Attention Level: Sustained Memory: Decreased short-term memory Following Commands: Follows one step commands inconsistently;Follows one step commands  with increased time Safety/Judgement: Decreased awareness of safety;Decreased awareness of deficits Awareness: Intellectual Problem Solving: Slow processing;Decreased initiation;Requires verbal cues;Difficulty  sequencing;Requires tactile cues General Comments: follows simple commands with improved consistency but decreases with fatigue, L inattention and poor awareness to deficits. pleasantly confused.        Exercises     Shoulder Instructions       General Comments      Pertinent Vitals/ Pain       Pain Assessment: Faces Faces Pain Scale: No hurt  Home Living                                          Prior Functioning/Environment              Frequency  Min 2X/week        Progress Toward Goals  OT Goals(current goals can now be found in the care plan section)  Progress towards OT goals: Progressing toward goals  Acute Rehab OT Goals Patient Stated Goal: to rehab  Plan Discharge plan remains appropriate;Frequency remains appropriate    Co-evaluation    PT/OT/SLP Co-Evaluation/Treatment: Yes Reason for Co-Treatment: For patient/therapist safety;To address functional/ADL transfers;Necessary to address cognition/behavior during functional activity   OT goals addressed during session: ADL's and self-care      AM-PAC OT "6 Clicks" Daily Activity     Outcome Measure   Help from another person eating meals?: A Little Help from another person taking care of personal grooming?: A Little Help from another person toileting, which includes using toliet, bedpan, or urinal?: Total Help from another person bathing (including washing, rinsing, drying)?: A Lot Help from another person to put on and taking off regular upper body clothing?: A Little Help from another person to put on and taking off regular lower body clothing?: Total 6 Click Score: 13    End of Session Equipment Utilized During Treatment: Gait belt  OT Visit Diagnosis: Other abnormalities of gait and mobility (R26.89);Muscle weakness (generalized) (M62.81);Pain;Other symptoms and signs involving the nervous system (R29.898);Other symptoms and signs involving cognitive function   Activity  Tolerance Patient tolerated treatment well   Patient Left in bed;with call bell/phone within reach;with bed alarm set;with family/visitor present   Nurse Communication Mobility status        Time: 8889-1694 OT Time Calculation (min): 34 min  Charges: OT General Charges $OT Visit: 1 Visit OT Treatments $Self Care/Home Management : 8-22 mins  Jolaine Artist, OT [place] Pager [phone] Office [employer] 07/20/2020, 1:15 PM

## 2020-08-07 ENCOUNTER — Emergency Department (HOSPITAL_COMMUNITY): Payer: Medicare Other

## 2020-08-07 ENCOUNTER — Encounter (HOSPITAL_COMMUNITY): Payer: Self-pay

## 2020-08-07 ENCOUNTER — Emergency Department (HOSPITAL_COMMUNITY)
Admission: EM | Admit: 2020-08-07 | Discharge: 2020-08-07 | Disposition: A | Discharge status: Home / Self Care | Payer: Medicare Other | Attending: Emergency Medicine | Admitting: Emergency Medicine

## 2020-08-07 DIAGNOSIS — Z96642 Presence of left artificial hip joint: Secondary | ICD-10-CM | POA: Insufficient documentation

## 2020-08-07 DIAGNOSIS — S7002XA Contusion of left hip, initial encounter: Secondary | ICD-10-CM | POA: Insufficient documentation

## 2020-08-07 DIAGNOSIS — Z7901 Long term (current) use of anticoagulants: Secondary | ICD-10-CM | POA: Diagnosis not present

## 2020-08-07 DIAGNOSIS — W19XXXA Unspecified fall, initial encounter: Secondary | ICD-10-CM | POA: Diagnosis not present

## 2020-08-07 DIAGNOSIS — S0083XA Contusion of other part of head, initial encounter: Secondary | ICD-10-CM | POA: Insufficient documentation

## 2020-08-07 DIAGNOSIS — S79912A Unspecified injury of left hip, initial encounter: Secondary | ICD-10-CM | POA: Diagnosis present

## 2020-08-07 DIAGNOSIS — Z20822 Contact with and (suspected) exposure to covid-19: Secondary | ICD-10-CM | POA: Diagnosis not present

## 2020-08-07 DIAGNOSIS — Z859 Personal history of malignant neoplasm, unspecified: Secondary | ICD-10-CM | POA: Diagnosis not present

## 2020-08-07 LAB — CBC
HCT: 38.3 % (ref 36.0–46.0)
Hemoglobin: 12.5 g/dL (ref 12.0–15.0)
MCH: 31 pg (ref 26.0–34.0)
MCHC: 32.6 g/dL (ref 30.0–36.0)
MCV: 95 fL (ref 80.0–100.0)
Platelets: 247 10*3/uL (ref 150–400)
RBC: 4.03 MIL/uL (ref 3.87–5.11)
RDW: 14.5 % (ref 11.5–15.5)
WBC: 7.3 10*3/uL (ref 4.0–10.5)
nRBC: 0 % (ref 0.0–0.2)

## 2020-08-07 LAB — COMPREHENSIVE METABOLIC PANEL
ALT: 38 U/L (ref 0–44)
AST: 43 U/L — ABNORMAL HIGH (ref 15–41)
Albumin: 4 g/dL (ref 3.5–5.0)
Alkaline Phosphatase: 66 U/L (ref 38–126)
Anion gap: 9 (ref 5–15)
BUN: 20 mg/dL (ref 8–23)
CO2: 25 mmol/L (ref 22–32)
Calcium: 9 mg/dL (ref 8.9–10.3)
Chloride: 105 mmol/L (ref 98–111)
Creatinine, Ser: 0.51 mg/dL (ref 0.44–1.00)
GFR, Estimated: >60 mL/min (ref >60)
Glucose, Bld: 100 mg/dL — ABNORMAL HIGH (ref 70–99)
Potassium: 4.4 mmol/L (ref 3.5–5.1)
Sodium: 139 mmol/L (ref 135–145)
Total Bilirubin: 1.1 mg/dL (ref 0.3–1.2)
Total Protein: 6.9 g/dL (ref 6.5–8.1)

## 2020-08-07 LAB — TYPE AND SCREEN
ABO/RH(D): O POS
Antibody Screen: NEGATIVE

## 2020-08-07 LAB — RESP PANEL BY RT-PCR (FLU A&B, COVID) ARPGX2
Influenza A by PCR: NEGATIVE
Influenza B by PCR: NEGATIVE
SARS Coronavirus 2 by RT PCR: NEGATIVE

## 2020-08-07 LAB — PROTIME-INR
INR: 1.1 (ref 0.8–1.2)
Prothrombin Time: 14.1 seconds (ref 11.4–15.2)

## 2020-08-07 NOTE — ED Provider Notes (Signed)
I received the patient in signout from Dr. Jeanell Sparrow.  Briefly the patient is a 87 year old female with a recent stroke who suffered a fall and was complaining of hip pain.  Plain film done by her facility was concerning for a hip fracture.  Plain film here is negative.  Plan to await her blood work if unremarkable will discharge her back to her facility.  Blood work has resulted without anemia no significant electrolyte abnormality.  Will discharge home.   Deno Etienne, DO 08/07/20 1724

## 2020-08-07 NOTE — Discharge Instructions (Signed)
Your hip is not broken.  There is no significant lab abnormality.  Please follow-up with your family doctor in the office.

## 2020-08-07 NOTE — ED Notes (Signed)
Contacted [facility] for discharge transport

## 2020-08-07 NOTE — ED Notes (Signed)
Attempted to contact [facility] to inform them of pt being discharged.

## 2020-08-07 NOTE — ED Provider Notes (Signed)
Monica Ward DEPT Provider Note   CSN: [id number] Arrival date & time: 08/07/20  1417     History Chief Complaint  Patient presents with  . Fall    Monica Ward is a 87 y.o. female.  HPI     87 year old female history of recent admission for stroke, high cholesterol, presents today complaining of fall and left hip pain.  Patient history from nursing notes states that she is from [place] with had a fall yesterday.  She normally walks with a walker.  They report the x-rays are done facility that show a fracture of her left hip but they are unsure this is a new or old fracture.  Patient complaining of pain left hip.  Per EMS, patient is confused at baseline.  Patient tells me she fell but is unclear on when she fell.  She has a bruise less of her face but does not report any history of hitting her head.  I reviewed her old records in epic and her recent discharge. Discussed with son Monica Ward.  He is listed as primary consult tact.  He reports that she is previously had surgery and review of records show that she had surgery by Dr. Delfino Ward. Past Medical History:  Diagnosis Date  . Arthritis    "probably" (01/28/2012)  . Cancer ([place]) Around 2002-2003   Unclear history of "7 lb" abdominal tumor treated at [facility] ~2003 s/p open abdominal colonic resection and radiation  . Complication of anesthesia    "trouble waking up; too much med given" (01/28/2012)  . Constipation 02/03/2014  . Constipation, chronic 02/06/2014  . Diverticulitis    Per chart   . Diverticulosis 02/03/2014  . High cholesterol   . Leiomyosarcoma ([place]) 10/28/2011   Patient had surgery for a spindle cell tumor in 1999. Patient had surgery in 2000 and mass which was presumed to be a leiomyosarcoma rather than a spindle cell cancer. I have no further records of path reports to give me a good definite path diagnosis of her tumor resection.   . Precordial pain 02/12/2012   . SBO (small bowel obstruction) ([place]) 08/2009   Early, partial SBO improved with conservative Tx     Patient Active Problem List   Diagnosis Date Noted  . Acute lower UTI 07/18/2020  . CVA (cerebral vascular accident) ([place]) 07/18/2020  . Acute ischemic right MCA stroke ([place]) 07/17/2020  . History of hip fracture 10/02/2016  . 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 History of small bowel obstruction 10/02/2016  . Age-related osteoporosis without current pathological fracture 10/02/2016  . Moderate malnutrition ([place]) 02/05/2015  . Thrombocytopenia ([place]) 02/02/2015  . High cholesterol   . Constipation, chronic 02/06/2014  . Diverticulosis 02/03/2014  . B12 deficiency 02/03/2014    Past Surgical History:  Procedure Laterality Date  . ABDOMINAL HYSTERECTOMY  ~ 1985   ovaries resected B; DUB.  [facility] APPENDECTOMY  1968  . CATARACT EXTRACTION, BILATERAL  09/27/2015  . [place]  . COLECTOMY  2003   "cancer"  . TONSILLECTOMY     "when I was a child"  . TOTAL HIP ARTHROPLASTY Left 01/31/2015   Procedure:  ANTERIOR APPROACH  LEFT HEMI ARTHROPLASTY;  Surgeon: Rod Can, MD;  Location: [facility];  Service: Orthopedics;  Laterality: Left;  . TUMOR REMOVAL  1990's;  X 2   "benign"     OB History   No obstetric history on file.     Family History  Problem  Relation Age of Onset  . Heart failure Father 45       Heart failure  . Heart disease Father     Social History   Tobacco Use  . Smoking status: Never Smoker  . Smokeless tobacco: Never Used  Substance Use Topics  . Alcohol use: Yes    Alcohol/week: 3.0 standard drinks    Types: 3 Glasses of wine per week    Comment: social  . Drug use: No    Home Medications Prior to Admission medications   Medication Sig Start Date End Date Taking? Authorizing Provider  apixaban (ELIQUIS) 2.5 MG TABS tablet Take 1 tablet (2.5 mg total) by mouth 2 (two) times daily. 07/20/20   Charlynne Cousins, MD  aspirin EC 81 MG  tablet Take 81 mg by mouth every 4 (four) hours as needed for mild pain.    [provider]  atorvastatin (LIPITOR) 80 MG tablet Take 1 tablet (80 mg total) by mouth daily. 07/20/20   Charlynne Cousins, MD  metoprolol tartrate (LOPRESSOR) 25 MG tablet Take 0.5 tablets (12.5 mg total) by mouth 2 (two) times daily. 07/20/20   Charlynne Cousins, MD    Allergies    Patient has no known allergies.  Review of Systems   Review of Systems  All other systems reviewed and are negative.   Physical Exam Updated Vital Signs BP 139/73 (BP Location: Left Arm) Comment: Simultaneous filing. User may not have seen previous data. Comment (BP Location): Simultaneous filing. User may not have seen previous data.  Pulse 65 Comment: Simultaneous filing. User may not have seen previous data.  Temp 98.5 F (36.9 C) (Oral) Comment: Simultaneous filing. User may not have seen previous data. Comment (Src): Simultaneous filing. User may not have seen previous data.  Resp (!) 21 Comment: Simultaneous filing. User may not have seen previous data.  SpO2 96% Comment: Simultaneous filing. User may not have seen previous data.  Physical Exam Vitals and nursing note reviewed.  Constitutional:      Appearance: She is well-developed.  HENT:     Head: Normocephalic and atraumatic.     Comments: Contusion to left cheek noted.    Right Ear: External ear normal.     Left Ear: External ear normal.     Nose: Nose normal.  Eyes:     Conjunctiva/sclera: Conjunctivae normal.     Pupils: Pupils are equal, round, and reactive to light.  Cardiovascular:     Rate and Rhythm: Normal rate and regular rhythm.     Heart sounds: Normal heart sounds.  Pulmonary:     Effort: Pulmonary effort is normal.     Breath sounds: Normal breath sounds.  Abdominal:     General: Bowel sounds are normal.     Palpations: Abdomen is soft.  Musculoskeletal:     Cervical back: Normal range of motion and neck supple.     Comments:  Contusion to left hip Tenderness to left hip Pulses intact Sensation intact  Skin:    General: Skin is warm and dry.  Neurological:     Mental Status: She is alert and oriented to person, place, and time.     Deep Tendon Reflexes: Reflexes are normal and symmetric.  Psychiatric:        Behavior: Behavior normal.        Thought Content: Thought content normal.        Judgment: Judgment normal.     ED Results / Procedures / Treatments  Labs (all labs ordered are listed, but only abnormal results are displayed) Labs Reviewed - No data to display  EKG EKG Interpretation  Date/Time:  Tuesday August 07 2020 15:09:48 EDT Ventricular Rate:  60 PR Interval:  174 QRS Duration: 90 QT Interval:  446 QTC Calculation: 446 R Axis:   -63 Text Interpretation: Sinus rhythm Left axis deviation Nonspecific T abnormalities, lateral leads Confirmed by Pattricia Boss [phone]) on 08/07/2020 4:33:55 PM   Radiology CT Head Wo Contrast  Result Date: 08/07/2020 CLINICAL DATA:  [place] yesterday, facial trauma EXAM: CT HEAD WITHOUT CONTRAST CT MAXILLOFACIAL WITHOUT CONTRAST CT CERVICAL SPINE WITHOUT CONTRAST TECHNIQUE: Multidetector CT imaging of the head, cervical spine, and maxillofacial structures were performed using the standard protocol without intravenous contrast. Multiplanar CT image reconstructions of the cervical spine and maxillofacial structures were also generated. COMPARISON:  07/17/2020 FINDINGS: CT HEAD FINDINGS Brain: Subcortical hypodensity within the right frontotemporal region images 15-17 consistent with evolution of distal right MCA territory infarct seen on prior MRI. Confluent hypodensities seen elsewhere throughout the periventricular and subcortical white matter are consistent with chronic small vessel ischemic changes. No acute infarct or hemorrhage. Lateral ventricles and midline structures are stable. No acute extra-axial fluid collections. No mass effect. Stable diffuse cerebral  atrophy. Vascular: No hyperdense vessel or unexpected calcification. Skull: Normal. Negative for fracture or focal lesion. Other: None. CT MAXILLOFACIAL FINDINGS Osseous: No fracture or mandibular dislocation. No destructive process. Orbits: Negative. No traumatic or inflammatory finding. Sinuses: Clear. Soft tissues: Negative. CT CERVICAL SPINE FINDINGS Alignment: Alignment is grossly anatomic. Skull base and vertebrae: No acute fracture. No primary bone lesion or focal pathologic process. Soft tissues and spinal canal: No prevertebral fluid or swelling. No visible canal hematoma. Disc levels: Moderate spondylosis at C5-6 and C6-7. Symmetrical neural foraminal narrowing at C5-6 with right predominant narrowing at C6-7. Upper chest: Airway is patent.  Lung apices are clear. Other: Reconstructed images demonstrate no additional findings. IMPRESSION: 1. Evolving subacute distal right MCA territory infarct as seen on previous MRI. 2. No acute intracranial process. 3. No acute facial bone fracture. 4. No acute cervical spine fracture. Electronically Signed   By: Randa Ngo M.D.   On: 08/07/2020 15:51   CT Cervical Spine Wo Contrast  Result Date: 08/07/2020 CLINICAL DATA:  [place] yesterday, facial trauma EXAM: CT HEAD WITHOUT CONTRAST CT MAXILLOFACIAL WITHOUT CONTRAST CT CERVICAL SPINE WITHOUT CONTRAST TECHNIQUE: Multidetector CT imaging of the head, cervical spine, and maxillofacial structures were performed using the standard protocol without intravenous contrast. Multiplanar CT image reconstructions of the cervical spine and maxillofacial structures were also generated. COMPARISON:  07/17/2020 FINDINGS: CT HEAD FINDINGS Brain: Subcortical hypodensity within the right frontotemporal region images 15-17 consistent with evolution of distal right MCA territory infarct seen on prior MRI. Confluent hypodensities seen elsewhere throughout the periventricular and subcortical white matter are consistent with chronic  small vessel ischemic changes. No acute infarct or hemorrhage. Lateral ventricles and midline structures are stable. No acute extra-axial fluid collections. No mass effect. Stable diffuse cerebral atrophy. Vascular: No hyperdense vessel or unexpected calcification. Skull: Normal. Negative for fracture or focal lesion. Other: None. CT MAXILLOFACIAL FINDINGS Osseous: No fracture or mandibular dislocation. No destructive process. Orbits: Negative. No traumatic or inflammatory finding. Sinuses: Clear. Soft tissues: Negative. CT CERVICAL SPINE FINDINGS Alignment: Alignment is grossly anatomic. Skull base and vertebrae: No acute fracture. No primary bone lesion or focal pathologic process. Soft tissues and spinal canal: No prevertebral fluid or swelling. No visible canal hematoma. Disc levels:  Moderate spondylosis at C5-6 and C6-7. Symmetrical neural foraminal narrowing at C5-6 with right predominant narrowing at C6-7. Upper chest: Airway is patent.  Lung apices are clear. Other: Reconstructed images demonstrate no additional findings. IMPRESSION: 1. Evolving subacute distal right MCA territory infarct as seen on previous MRI. 2. No acute intracranial process. 3. No acute facial bone fracture. 4. No acute cervical spine fracture. Electronically Signed   By: Randa Ngo M.D.   On: 08/07/2020 15:51   DG Chest Port 1 View  Result Date: 08/07/2020 CLINICAL DATA:  [place] yesterday, confusion EXAM: PORTABLE CHEST 1 VIEW COMPARISON:  04/02/2015 FINDINGS: Single frontal view of the chest demonstrates an unremarkable cardiac silhouette. No airspace disease, effusion, or pneumothorax. No acute bony abnormalities. IMPRESSION: 1. No acute intrathoracic process. Electronically Signed   By: Randa Ngo M.D.   On: 08/07/2020 15:44   DG Hip Unilat W or Wo Pelvis 2-3 Views Left  Result Date: 08/07/2020 CLINICAL DATA:  Left hip pain after a fall. EXAM: DG HIP (WITH OR WITHOUT PELVIS) 2-3V LEFT COMPARISON:  01/31/2015 FINDINGS:  AP view of the pelvis and left hip. Surgical clips in the left hemipelvis. Sacroiliac joints are symmetric. Left hip arthroplasty, without acute hardware complication. Right femoral head is located. IMPRESSION: No acute osseous abnormality.  Status post left hip arthroplasty. Electronically Signed   By: Abigail Miyamoto M.D.   On: 08/07/2020 16:30   CT MAXILLOFACIAL WO CONTRAST  Result Date: 08/07/2020 CLINICAL DATA:  [place] yesterday, facial trauma EXAM: CT HEAD WITHOUT CONTRAST CT MAXILLOFACIAL WITHOUT CONTRAST CT CERVICAL SPINE WITHOUT CONTRAST TECHNIQUE: Multidetector CT imaging of the head, cervical spine, and maxillofacial structures were performed using the standard protocol without intravenous contrast. Multiplanar CT image reconstructions of the cervical spine and maxillofacial structures were also generated. COMPARISON:  07/17/2020 FINDINGS: CT HEAD FINDINGS Brain: Subcortical hypodensity within the right frontotemporal region images 15-17 consistent with evolution of distal right MCA territory infarct seen on prior MRI. Confluent hypodensities seen elsewhere throughout the periventricular and subcortical white matter are consistent with chronic small vessel ischemic changes. No acute infarct or hemorrhage. Lateral ventricles and midline structures are stable. No acute extra-axial fluid collections. No mass effect. Stable diffuse cerebral atrophy. Vascular: No hyperdense vessel or unexpected calcification. Skull: Normal. Negative for fracture or focal lesion. Other: None. CT MAXILLOFACIAL FINDINGS Osseous: No fracture or mandibular dislocation. No destructive process. Orbits: Negative. No traumatic or inflammatory finding. Sinuses: Clear. Soft tissues: Negative. CT CERVICAL SPINE FINDINGS Alignment: Alignment is grossly anatomic. Skull base and vertebrae: No acute fracture. No primary bone lesion or focal pathologic process. Soft tissues and spinal canal: No prevertebral fluid or swelling. No visible canal  hematoma. Disc levels: Moderate spondylosis at C5-6 and C6-7. Symmetrical neural foraminal narrowing at C5-6 with right predominant narrowing at C6-7. Upper chest: Airway is patent.  Lung apices are clear. Other: Reconstructed images demonstrate no additional findings. IMPRESSION: 1. Evolving subacute distal right MCA territory infarct as seen on previous MRI. 2. No acute intracranial process. 3. No acute facial bone fracture. 4. No acute cervical spine fracture. Electronically Signed   By: Randa Ngo M.D.   On: 08/07/2020 15:51    Procedures Procedures   Medications Ordered in ED Medications - No data to display  ED Course  I have reviewed the triage vital signs and the nursing notes.  Pertinent labs & imaging results that were available during my care of the patient were reviewed by me and considered in my medical decision making (  see chart for details).    MDM Rules/Calculators/A&P                         87 year old female history of right MCA in rehab facility.  Reports recent falls and some pain in her left hip.  They obtain x-Cong [place] today and had question of fracture.  X-rays obtained here showed no evidence of acute fracture.  She is moving her hip well.  She does have some mild tenderness over the bruise and bruising but I palpate this area.  CT of the head, neck, and face without any evidence of acute abnormality. Patient to be reevaluated after ambulating and labs returned Discussed with Dr. Tyrone Nine who reviewed labs and disposition patient  Final Clinical Impression(s) / ED Diagnoses Final diagnoses:  Fall, initial encounter  Contusion of left hip, initial encounter    Rx / DC Orders ED Discharge Orders    None       Pattricia Boss, MD 08/07/20 1653

## 2020-08-07 NOTE — ED Triage Notes (Signed)
Pt presents via EMS from [facility] with c/o fall that occurred yesterday. Pt normally ambulates with a walker. X-ray done at facility and does show a fracture of her left hip but they are unsure whether this is a new or old fracture. Pt points to her left groin area and says that is where the pain is, does have shortening on the left side per EMS. Pt is confused at baseline per EMS.

## 2020-08-13 NOTE — Progress Notes (Signed)
Cardiology Office Note:    Date:  08/15/2020   ID:  Monica Ward, DOB October 06, 1929, MRN [id number]  PCP:  [facility], [facility]  Cardiologist:  Elouise Munroe, MD  Electrophysiologist:  None   Referring MD: Horald Pollen, *   Chief Complaint: hospital follow-up for atrial fibrillation  History of Present Illness:    Monica Ward is a 87 y.o. female with a history of atypical chest pain in 2013 felt to be secondary to costochondritis, paroxysmal atrial fibrillation on Eliquis, recent acute CVA, hyperlipidemia, chronic constipation with diverticulosis, small bowel obstruction, and leiomyosarcoma who is followed by Dr. Margaretann Loveless and presents today for hospital follow-up of atrial fibrillation.  Patient previously seen by Dr. Percival Spanish in 2013 following hospitalization for chest pain. She ruled out for MI and EKG showed no evidence of ischemia or infarct. Chest pain felt to be due secondary costochondritis. Symptoms completely resolved by time of visit with Dr. Percival Spanish. She was also noted to have some bradycardia during hospitalization but this occurred while sleeping. No further work-up was felt to be needed.   She was not seen again by Cardiology until recent hospitalization. She was admitted from 07/17/2020 to 07/20/2020 for acute CVA after being found down at her home. Echo showed LVEF of 60-65% with normal wall motion and grade 2 diastolic dysfunction. Initial plan was for placement of loop recorder; however, patient was then noted to be going in and out of atrial fibrillation on telemetry. Patient was started on Lopressor and Eliquis once OK with Neurology. Also started on antibiotics for possible UTI. She was discharged to SNF.  She was seen back in the ED on 08/07/2020 for further evaluation of left hip pain after a fall at [facility]. X-ray showed no acute findings and she was felt to be safe for discharge.  Patient presents today for follow-up.  Here with son. It sounds like patient is deconditioned but doing well from a cardiac standpoint. She came home from SNF yesterday. Maintaining sinus rhythm. No chest pain, shortness of breath, palpitations, lightheadedness, dizziness, or syncope. She has some mild lower extremity edema, left slightly worse than right (left leg was worse in the hospital at well due to injury from fall - lower extremity dopplers were negative for DVT). She still has some mild residual left sided weakness and has trouble getting up from a chair. Also still has some visual deficits.  Past Medical History:  Diagnosis Date  . Arthritis    "probably" (01/28/2012)  . Cancer ([place]) Around 2002-2003   Unclear history of "7 lb" abdominal tumor treated at [facility] ~2003 s/p open abdominal colonic resection and radiation  . Complication of anesthesia    "trouble waking up; too much med given" (01/28/2012)  . Constipation 02/03/2014  . Constipation, chronic 02/06/2014  . Diverticulitis    Per chart   . Diverticulosis 02/03/2014  . High cholesterol   . 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 Patient had surgery in 2000 and mass which was presumed to be a leiomyosarcoma rather than a spindle cell cancer. I have no further records of path reports to give me a good definite path diagnosis of her tumor resection.   . Precordial pain 02/12/2012  . SBO (small bowel obstruction) ([place]) 08/2009   Early, partial SBO improved with conservative Tx     Past Surgical History:  Procedure Laterality Date  . ABDOMINAL HYSTERECTOMY  ~ 1985  ovaries resected B; DUB.  Marland Kitchen APPENDECTOMY  1968  . CATARACT EXTRACTION, BILATERAL  09/27/2015  . [place]  . COLECTOMY  2003   "cancer"  . TONSILLECTOMY     "when I was a child"  . TOTAL HIP ARTHROPLASTY Left 01/31/2015   Procedure:  ANTERIOR APPROACH  LEFT HEMI ARTHROPLASTY;  Surgeon: Rod Can, MD;  Location: [facility];  Service: Orthopedics;   Laterality: Left;  . TUMOR REMOVAL  1990's;  X 2   "benign"    Current Medications: Current Meds  Medication Sig  . apixaban (ELIQUIS) 2.5 MG TABS tablet Take 1 tablet (2.5 mg total) by mouth 2 (two) times daily.  Marland Kitchen atorvastatin (LIPITOR) 80 MG tablet Take 1 tablet (80 mg total) by mouth daily.  . metoprolol tartrate (LOPRESSOR) 25 MG tablet Take 0.5 tablets (12.5 mg total) by mouth 2 (two) times daily.  . [DISCONTINUED] aspirin EC 81 MG tablet Take 81 mg by mouth every 4 (four) hours as needed for mild pain.     Allergies:   Patient has no known allergies.   Social History   Socioeconomic History  . Marital status: Married    Spouse name: Not on file  . Number of children: 3  . Years of education: Not on file  . Highest education level: Not on file  Occupational History  . Occupation: retired  Tobacco Use  . Smoking status: Never Smoker  . Smokeless tobacco: Never Used  Substance and Sexual Activity  . Alcohol use: Yes    Alcohol/week: 3.0 standard drinks    Types: 3 Glasses of wine per week    Comment: social  . Drug use: No  . Sexual activity: Not Currently  Other Topics Concern  . Not on file  Social History Narrative      Marital status: widowed in 08/2015; married x 60 yyears.      Children: 3 children in area; 4 grandchildren; no gg.      Lives: alone in [place].       Employment: Retired      Tobacco: smoked briefly in college      Alcohol: 1-2 glass of wine per week.      Exercise:  Very little in 2018; cleans house weekly; takes care garden in back and flowers.      ADLs: performs all ADLs; still drives.  No assistant devices.  Cleans townhome.  Does own grocery shopping.  Pay bills.        Advanced Directives:  FULL CODE; no prolonged measures.  HCPOA: son/Monica Ward         Social Determinants of Health   Financial Resource Strain: Not on file  Food Insecurity: Not on file  Transportation Needs: Not on file  Physical Activity: Not on file   Stress: Not on file  Social Connections: Not on file     Family History: The patient's family history includes Heart disease in her father; Heart failure (age of onset: 43) in her father.  ROS:   Please see the history of present illness.     EKGs/Labs/Other Studies Reviewed:    The following studies were reviewed today:  Echocardiogram 07/18/2020: Impressions: 1. Left ventricular ejection fraction, by estimation, is 60 to 65%. The  left ventricle has normal function. The left ventricle has no regional  wall motion abnormalities. Left ventricular diastolic parameters are  consistent with Grade II diastolic  dysfunction (pseudonormalization). Elevated left atrial pressure.  2. Right ventricular systolic function is  normal. The right ventricular  size is normal. There is mildly elevated pulmonary artery systolic  pressure. The estimated right ventricular systolic pressure is 90.2 mmHg.  3. Left atrial size was moderately dilated.  4. The mitral valve is normal in structure. No evidence of mitral valve  regurgitation.  5. The aortic valve is tricuspid. Aortic valve regurgitation is not  visualized. Mild aortic valve sclerosis is present, with no evidence of  aortic valve stenosis.  6. The inferior vena cava is dilated in size with >50% respiratory  variability, suggesting right atrial pressure of 8 mmHg.    EKG:  EKG ordered today. EKG personally reviewed and demonstrates: Normal sinus rhythm, rate 62 bpm, with no acute ischemic changes.  Recent Labs: 07/19/2020: Magnesium 2.1; TSH 2.386 08/07/2020: ALT 38; BUN 20; Creatinine, Ser 0.51; Hemoglobin 12.5; Platelets 247; Potassium 4.4; Sodium 139  Recent Lipid Panel    Component Value Date/Time   CHOL 243 (H) 07/17/2020 1222   CHOL 212 (H) 03/02/2019 1645   TRIG 76 07/17/2020 1222   HDL 68 07/17/2020 1222   HDL 66 03/02/2019 1645   CHOLHDL 3.6 07/17/2020 1222   VLDL 15 07/17/2020 1222   LDLCALC 160 (H) 07/17/2020 1222    LDLCALC 132 (H) 03/02/2019 1645    Physical Exam:    Vital Signs: BP 117/63   Pulse 62   Ht 5\' 8"  (1.727 m)   Wt 103 lb 9.6 oz (47 kg)   SpO2 97%   BMI 15.75 kg/m     Wt Readings from Last 3 Encounters:  08/15/20 103 lb 9.6 oz (47 kg)  07/17/20 108 lb 14.5 oz (49.4 kg)  12/28/19 105 lb (47.6 kg)     General: 87 y.o. female in no acute distress. HEENT: Normocephalic and atraumatic. Sclera clear.  Neck: Supple. No carotid bruits. No JVD. Heart: RRR. Distinct S1 and S2. No murmurs, gallops, or rubs. Radial pulses 2+ and equal bilaterally. Lungs: No increased work of breathing. Clear to ausculation bilaterally. No wheezes, rhonchi, or rales.  Abdomen: Soft, non-distended, and non-tender to palpation.   MSK: Normal strength and tone for age.  Extremities: Mild lower extremity edema (left leg slightly larger than right).  Skin: Warm and dry. Neuro: No gross focal deficits. Psych: Normal affect. Responds appropriately.  Assessment:    1. Paroxysmal atrial fibrillation (HCC)   2. Hyperlipidemia, unspecified hyperlipidemia type   3. Lower extremity edema   4. Cerebrovascular accident (CVA) due to embolism of precerebral artery (HCC)     Plan:    Paroxysmal Atrial Fibrillation - Patient recently diagnosed with atrial fibrillation during admission for acute CVA.  - Maintaining sinus rhythm at this time. - Echo showed normal LV function.  - Continue Lopressor 12.5mg  twice daily.  - Continue Eliquis 2.5mg  twice daily (reduced dose given age and weight). Hemoglobin normal on labs from 08/07/2020.  Hyperlipidemia - Lipid panel during recent admission: Total Cholesterol 243, Triglycerides 76, HDL 68, LDL 160. - LDL goal <70 given stroke. - Started on Lipitor 80mg  daily during recent admission.  - Repeat lipid panel and LFTs in 6-8 weeks.   Lower Extremity Edema - Patient has mild lower extremity edema. Left slightly larger than right. Left leg was also larger than right when  I saw her in the ED due to injury sustained in fall. Lower extremity dopplers in the hospital were negative for DVT.  - No other evidence of volume overload. Don't think we need Lasix at this point. - Recommended elevating  legs, compression stockings, and reducing sodium intake.  Recent CVA - Recovering well. Back home from SNF. Still has some left sided weakness. - Continue Eliquis and high-sensitivity statin.   - Follows with Neurology.  Disposition: Follow up in 6 months.   Medication Adjustments/Labs and Tests Ordered: Current medicines are reviewed at length with the patient today.  Concerns regarding medicines are outlined above.  Orders Placed This Encounter  Procedures  . Lipid panel  . Hepatic function panel  . EKG 12-Lead   No orders of the defined types were placed in this encounter.   Patient Instructions  Medication Instructions:  No Changes *If you need a refill on your cardiac medications before your next appointment, please call your pharmacy*   Lab Work: Lipid Panel , Hepatic Panel ( To Be Done in 1-2 months) If you have labs (blood work) drawn today and your tests are completely normal, you will receive your results only by: Marland Kitchen MyChart Message (if you have MyChart) OR . A paper copy in the mail If you have any lab test that is abnormal or we need to change your treatment, we will call you to review the results.   Testing/Procedures: No Testing   Follow-Up: At [facility], you and your health needs are our priority.  As part of our continuing mission to provide you with exceptional heart care, we have created designated Provider Care Teams.  These Care Teams include your primary Cardiologist (physician) and Advanced Practice Providers (APPs -  Physician Assistants and Nurse Practitioners) who all work together to provide you with the care you need, when you need it.  We recommend signing up for the patient portal called "MyChart".  Sign up information is  provided on this After Visit Summary.  MyChart is used to connect with patients for Virtual Visits (Telemedicine).  Patients are able to view lab/test results, encounter notes, upcoming appointments, etc.  Non-urgent messages can be sent to your provider as well.   To learn more about what you can do with MyChart, go to [email or web address].    Your next appointment:   6 month(s)  The format for your next appointment:   In Person  Provider:   You may see Elouise Munroe, MD or one of the following Advanced Practice Providers on your designated Care Team:    [facility] PA-C          Signed, Eppie Gibson  08/15/2020 6:46 PM    [place]

## 2020-08-15 ENCOUNTER — Other Ambulatory Visit: Payer: Self-pay

## 2020-08-15 ENCOUNTER — Ambulatory Visit (INDEPENDENT_AMBULATORY_CARE_PROVIDER_SITE_OTHER): Payer: Medicare Other | Admitting: Student

## 2020-08-15 ENCOUNTER — Encounter: Payer: Self-pay | Admitting: Student

## 2020-08-15 VITALS — BP 117/63 | HR 62 | Ht 68.0 in | Wt 103.6 lb

## 2020-08-15 DIAGNOSIS — I6310 Cerebral infarction due to embolism of unspecified precerebral artery: Secondary | ICD-10-CM

## 2020-08-15 DIAGNOSIS — I480 Paroxysmal atrial fibrillation: Secondary | ICD-10-CM

## 2020-08-15 DIAGNOSIS — R600 Localized edema: Secondary | ICD-10-CM | POA: Diagnosis not present

## 2020-08-15 DIAGNOSIS — E785 Hyperlipidemia, unspecified: Secondary | ICD-10-CM

## 2020-08-15 NOTE — Patient Instructions (Signed)
Medication Instructions:  No Changes *If you need a refill on your cardiac medications before your next appointment, please call your pharmacy*   Lab Work: Lipid Panel , Hepatic Panel ( To Be Done in 1-2 months) If you have labs (blood work) drawn today and your tests are completely normal, you will receive your results only by: Marland Kitchen MyChart Message (if you have MyChart) OR . A paper copy in the mail If you have any lab test that is abnormal or we need to change your treatment, we will call you to review the results.   Testing/Procedures: No Testing   Follow-Up: At [facility], you and your health needs are our priority.  As part of our continuing mission to provide you with exceptional heart care, we have created designated Provider Care Teams.  These Care Teams include your primary Cardiologist (physician) and Advanced Practice Providers (APPs -  Physician Assistants and Nurse Practitioners) who all work together to provide you with the care you need, when you need it.  We recommend signing up for the patient portal called "MyChart".  Sign up information is provided on this After Visit Summary.  MyChart is used to connect with patients for Virtual Visits (Telemedicine).  Patients are able to view lab/test results, encounter notes, upcoming appointments, etc.  Non-urgent messages can be sent to your provider as well.   To learn more about what you can do with MyChart, go to [email or web address].    Your next appointment:   6 month(s)  The format for your next appointment:   In Person  Provider:   You may see Elouise Munroe, MD or one of the following Advanced Practice Providers on your designated Care Team:    Sande Rives PA-C

## 2020-08-16 ENCOUNTER — Encounter: Payer: Self-pay | Admitting: Adult Health

## 2020-08-16 ENCOUNTER — Ambulatory Visit (INDEPENDENT_AMBULATORY_CARE_PROVIDER_SITE_OTHER): Payer: Medicare Other | Admitting: Adult Health

## 2020-08-16 VITALS — BP 104/58 | HR 62 | Ht 64.0 in | Wt 104.0 lb

## 2020-08-16 DIAGNOSIS — I63411 Cerebral infarction due to embolism of right middle cerebral artery: Secondary | ICD-10-CM

## 2020-08-16 DIAGNOSIS — I480 Paroxysmal atrial fibrillation: Secondary | ICD-10-CM

## 2020-08-16 NOTE — Progress Notes (Signed)
[facility] [address] [place]. [place] [address] [phone]       [place] Monica Ward Date of Birth:  02/15/1930 Medical Record Number:  [id number]   Reason for Referral:  hospital stroke follow up    SUBJECTIVE:   CHIEF COMPLAINT:  Chief Complaint  Patient presents with  . Follow-up    RM 8 with son (david) Pt is well, weakness in extremities. No other symptoms on a stroke stand point     HPI:   Monica Ward is a 87 y.o. female with history of chronic constipation, diverticulitis, SBO's, colectomy, appendectomy, hysterectomywho presented on 07/17/2020 with acute onset of confusion and agitation left-sided weakness and slurred speech after being found on the floor by her son with unknown time of fall.  Personally reviewed hospitalization pertinent progress notes, lab work and imaging with summary provided.  Evaluated by Dr. Erlinda Hong with stroke work-up revealing right MCA acute/subacute infarct, embolic pattern likely due to newly diagnosed A. fib.  On aspirin PTA and switch to Eliquis for new diagnosis of PAF and stroke prevention.  HTN stable.  LDL 160 initiated atorvastatin 40 mg daily.  Other stroke risk factors include advanced age but no prior stroke history.  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  Evaluated by therapies and recommended discharge to SNF for ongoing therapy needs.   Right MCA Acute/Subacute Infarct, embolic pattern, likely due to newly diagnosed afib  CT head-No acute abnormality head.  CTA head & neck-Nearly occlusive thrombus within the proximal posterior division of the right MCA. Diminished flow within more distal vessels.Moderate stenosis proximal right P2 PCA. No significant stenosis in the neck.  MRI-limitedsequences butacute to subacute right MCA territory infarcts.  2D EchoEF  60-65%  LE venous Doppler-no DVT  Tele monitoring showed PAF and confirmed with cardiology EP  LDL160  HgbA1c5.6  VTE prophylaxis -Lovenox  aspirin 81 mg dailyprior to admission, now on aspirin 325 mg daily and clopidogrel 75 mg daily.Continue DAPT for today and will start eliquis tomorrow.  Therapy recommendations:SNF  Disposition:[facility]  Today, 08/16/2020, Monica Ward is being seen for hospital follow-up accompanied by her son  She has been doing well since discharge and recently returned back home on Tuesday living with her son from SNF.  Reports residual mild left-sided weakness as well as generalized weakness which is her baseline. Son believes possible visual impairment post stroke as she will have difficulty at times seeing things in her left periphery - plans to schedule f/u with Dr. Katy Fitch for further evaluation. Son reports appetite has been adequate but prefers softer food and cut up in small pieces which she preferred even prior to her stroke.  She has been slowly returning back to performing prior activities independently but son continues to provide supervision.  Per son, f/u with PCP scheduled next week and plans on obtaining home health orders at that time.  She continues to use rolling walker and no recent falls since she has returned home.  She was evaluated in ED on 4/12 from SNF post fall without acute injury -she does continue to report left-sided hip pain with hx of prior surgical repair.  Denies new or worsening stroke/TIA symptoms   Remains on Eliquis and atorvastatin without associated side effects Blood pressure today 104/58  He does report SNF started her on depression medication but unable to state name -unable  to verify via epic.  She denies any depression concerns.  No further concerns at this time     ROS:   14 system review of systems performed and negative with exception of those listed in HPI  PMH:  Past Medical History:   Diagnosis Date  . Arthritis    "probably" (01/28/2012)  . Cancer ([place]) Around 2002-2003   Unclear history of "7 lb" abdominal tumor treated at [facility] ~2003 s/p open abdominal colonic resection and radiation  . Complication of anesthesia    "trouble waking up; too much med given" (01/28/2012)  . Constipation 02/03/2014  . Constipation, chronic 02/06/2014  . Diverticulitis    Per chart   . Diverticulosis 02/03/2014  . High cholesterol   . 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 Patient had surgery in 2000 and mass which was presumed to be a leiomyosarcoma rather than a spindle cell cancer. I have no further records of path reports to give me a good definite path diagnosis of her tumor resection.   . Precordial pain 02/12/2012  . SBO (small bowel obstruction) ([place]) 08/2009   Early, partial SBO improved with conservative Tx     PSH:  Past Surgical History:  Procedure Laterality Date  . ABDOMINAL HYSTERECTOMY  ~ 1985   ovaries resected B; DUB.  [facility] APPENDECTOMY  1968  . CATARACT EXTRACTION, BILATERAL  09/27/2015  . [place]  . COLECTOMY  2003   "cancer"  . TONSILLECTOMY     "when I was a child"  . TOTAL HIP ARTHROPLASTY Left 01/31/2015   Procedure:  ANTERIOR APPROACH  LEFT HEMI ARTHROPLASTY;  Surgeon: Rod Can, MD;  Location: [facility];  Service: Orthopedics;  Laterality: Left;  . TUMOR REMOVAL  1990's;  X 2   "benign"    Social History:  Social History   Socioeconomic History  . Marital status: Married    Spouse name: Not on file  . Number of children: 3  . Years of education: Not on file  . Highest education level: Not on file  Occupational History  . Occupation: retired  Tobacco Use  . Smoking status: Never Smoker  . Smokeless tobacco: Never Used  Substance and Sexual Activity  . Alcohol use: Yes    Alcohol/week: 3.0 standard drinks    Types: 3 Glasses of wine per week    Comment: social  . Drug use: No  .  Sexual activity: Not Currently  Other Topics Concern  . Not on file  Social History Narrative      Marital status: widowed in 08/2015; married x 60 yyears.      Children: 3 children in area; 4 grandchildren; no gg.      Lives: alone in [place].       Employment: Retired      Tobacco: smoked briefly in college      Alcohol: 1-2 glass of wine per week.      Exercise:  Very little in 2018; cleans house weekly; takes care garden in back and flowers.      ADLs: performs all ADLs; still drives.  No assistant devices.  Cleans townhome.  Does own grocery shopping.  Pay bills.        Advanced Directives:  FULL CODE; no prolonged measures.  HCPOA: son/David Basnett         Social Determinants of Health   Financial Resource Strain: Not on file  Food Insecurity: Not on file  Transportation Needs: Not on file  Physical Activity: Not on file  Stress: Not on file  Social Connections: Not on file  Intimate Partner Violence: Not on file    Family History:  Family History  Problem Relation Age of Onset  . Heart failure Father 2       Heart failure  . Heart disease Father     Medications:   Current Outpatient Medications on File Prior to Visit  Medication Sig Dispense Refill  . apixaban (ELIQUIS) 2.5 MG TABS tablet Take 1 tablet (2.5 mg total) by mouth 2 (two) times daily. 60 tablet 0  . atorvastatin (LIPITOR) 80 MG tablet Take 1 tablet (80 mg total) by mouth daily. 30 tablet 3  . metoprolol tartrate (LOPRESSOR) 25 MG tablet Take 0.5 tablets (12.5 mg total) by mouth 2 (two) times daily.    . polyethylene glycol (MIRALAX / GLYCOLAX) 17 g packet Take 17 g by mouth daily.     No current facility-administered medications on file prior to visit.    Allergies:  No Known Allergies    OBJECTIVE:  Physical Exam  Vitals:   08/16/20 1400  BP: (!) 104/58  Pulse: 62  Weight: 104 lb (47.2 kg)  Height: 5\' 4"  (1.626 m)   Body mass index is 17.85 kg/m. No exam data present   Post  stroke PHQ 2/9 Depression screen PHQ 2/9 08/16/2020  Decreased Interest 0  Down, Depressed, Hopeless 0  PHQ - 2 Score 0     General: Frail cachectic very pleasant elderly Caucasian female, seated, in no evident distress Head: head normocephalic and atraumatic.   Neck: supple with no carotid or supraclavicular bruits Cardiovascular: regular rate and rhythm, no murmurs Musculoskeletal: no deformity Skin:  no rash/petichiae Vascular:  Normal pulses all extremities   Neurologic Exam Mental Status: Awake and fully alert.   Fluent speech and language.  Follows all commands without difficulty.  Oriented to place and time. Recent and remote memory poor. Attention span and concentration appropriate and fund of knowledge impaired with son providing majority of history. Mood and affect appropriate.  Cranial Nerves: Fundoscopic exam reveals sharp disc margins. Pupils equal, briskly reactive to light. Extraocular movements full without nystagmus. Visual fields full to confrontation. Hearing intact. Facial sensation intact. Face, tongue, palate moves normally and symmetrically.  Motor: Generalized mild weakness throughout with muscle atrophy except slightly increased weakness left hand dexterity and left hip flexor weakness compared to right side Sensory.: intact to touch , pinprick , position and vibratory sensation.  Coordination: Rapid alternating movements normal in all extremities. Finger-to-nose and heel-to-shin performed accurately bilaterally.  Mildly orbits right arm over left arm Gait and Station: Arises from chair with moderate difficulty. Stance is slightly hunched. Gait demonstrates  short shuffled cautious steps with use of rolling walker. tandem walk and heel toe not attempted due to safety concerns.  Reflexes: 1+ and symmetric. Toes downgoing.     NIHSS  0 Modified Rankin  3      ASSESSMENT: Monica Ward is a 87 y.o. year old female presented with left-sided weakness, slurred  speech, confusion and agitation after being found down on 07/17/2020 with stroke work-up revealing right MCA acute/subacute infarct, embolic pattern likely secondary to newly diagnosed A. fib. Vascular risk factors include new dx PAF, HLD and advanced age .      PLAN:  1. R MCA stroke :  a. Residual deficit: Mild left hemiparesis, gait impairment and possible visual impairment.  Plan to start home  health therapies next week.  Advised use of rolling walker at all times for fall prevention b. Continue Eliquis (apixaban) daily  and atorvastatin 40 mg daily for secondary stroke prevention.   c. Discussed secondary stroke prevention measures and importance of close PCP follow up for aggressive stroke risk factor management  d. HLD: LDL goal <70. Recent LDL 160 - started atorvastatin 40 mg daily.  2. PAF, new dx: On Eliquis 2.5 mg twice daily (age and weight) for CHA2DS2-VASc score of 6.  Routine follow-up with cardiology for monitoring and management    Follow up in 3 months or call earlier if needed   CC:  [facility] provider: Dr. Leonie Man PCP: Pa, [facility]    I spent 45 minutes of face-to-face and non-face-to-face time with patient and son.  This included previsit chart review including recent hospitalization pertinent progress notes, lab work and imaging, lab review, study review, order entry, electronic health record documentation, patient education regarding recent stroke and etiology, residual deficits, importance of managing stroke risk factors and answered all other questions to patient and sons satisfaction  Frann Rider, AGNP-BC  [facility] [address] [place], [place] [address]  Phone [phone] Fax [phone] Note: This document was prepared with digital dictation and possible smart phrase technology. Any transcriptional errors that result from this process are unintentional.

## 2020-08-16 NOTE — Patient Instructions (Addendum)
Start home health therapies once you see your primary care doctor as this will greatly help improve weakness  Continue Eliquis (apixaban) daily  and atorvastatin 80mg  daily  for secondary stroke prevention  Continue to follow up with PCP regarding cholesterol and blood pressure management  Maintain strict control of hypertension with blood pressure goal below 130/90 and cholesterol with LDL cholesterol (bad cholesterol) goal below 70 mg/dL.   Continue to follow with cardiology for atrial fibrillation and eliquis management     Followup in the future with me in 3 months or call earlier if needed       Thank you for coming to see [place] at [facility]. I hope we have been able to provide you high quality care today.  You may receive a patient satisfaction survey over the next few weeks. We would appreciate your feedback and comments so that we may continue to improve ourselves and the health of our patients.

## 2020-08-17 ENCOUNTER — Other Ambulatory Visit: Payer: Self-pay

## 2020-08-17 ENCOUNTER — Telehealth: Payer: Self-pay | Admitting: Student

## 2020-08-17 NOTE — Telephone Encounter (Signed)
Called patient, advised of message from PA.  LVM with son- advising no new medications were needed for now but to let us know if they have any issues.  Left call back number if any other questions/concerns.

## 2020-08-17 NOTE — Telephone Encounter (Signed)
Pt c/o medication issue:  1. Name of Medication:  atorvastatin (LIPITOR) 80 MG tablet metoprolol tartrate (LOPRESSOR) 25 MG tablet Sertraline  2. How are you currently taking this medication (dosage and times per day)? 1 tablet daily sertraline, atorvastatin 1 tablet daily evening  3. Are you having a reaction (difficulty breathing--STAT)?   4. What is your medication issue? Patient's son states the patient received her atorvastatin, but not the metoprolol. He would like to know if the medications are the same. He also states the patient was prescribed Sertraline.

## 2020-08-17 NOTE — Telephone Encounter (Signed)
Sorry,  I didn't realize patient was no longer taking this. I guess they stopped it at her SNF. Heart rates are in the low 60's at baseline so I think it is fine if she remains off of this. If she has recurrent episodes of atrial fibrillation with elevated rates, we may need to rediscuss this. But for now OK for patient to remain off of Metoprolol.  Thank you!

## 2020-08-17 NOTE — Telephone Encounter (Signed)
Called patient and spoke to son- he went over current medication list.  Patient son states they have all the other medications, but they said patient has not been taking the Metoprolol 12.5 twice daily. I did ask son if they were out of it and needed it sent,but son states patient has not been taking this medication at all. Patient is taking Zoloft 25 mg 1 tablet daily, will add to med list and update.   After review of last note- Callie states to continue the Lopressor but patient son states she has not been on it. [facility] 104[facility]58- [facility] sending medication into the pharmacy.  Thank you.

## 2020-08-20 ENCOUNTER — Other Ambulatory Visit: Payer: Self-pay | Admitting: *Deleted

## 2020-08-20 ENCOUNTER — Telehealth: Payer: Self-pay | Admitting: *Deleted

## 2020-08-20 MED ORDER — APIXABAN 2.5 MG PO TABS: 2.5000 mg | ORAL_TABLET | Freq: Two times a day (BID) | ORAL | 3 refills | Status: DC

## 2020-08-20 NOTE — Progress Notes (Signed)
I agree with the above plan 

## 2020-08-20 NOTE — Telephone Encounter (Signed)
Printed rx for Eliquis patient assistance

## 2020-08-20 NOTE — Telephone Encounter (Signed)
Patient assistance forms in Dr Hochrein's box Patient is marked as Dr Margaretann Loveless patient Forms placed in her box to sign

## 2020-08-23 ENCOUNTER — Telehealth: Payer: Self-pay | Admitting: Adult Health

## 2020-08-23 NOTE — Telephone Encounter (Signed)
Received fax from Dr. Katy Fitch regarding evaluation on 08/22/2020 with patient complaints of trouble with peripheral vision since her stroke on 07/19/2020  From impression/plan, documented homonymous bilateral field deficits, left side from stroke 07/19/2020.  VF/OCT done.  OCT currently normal.  Plans to repeat study in 1 year.

## 2020-08-24 NOTE — Telephone Encounter (Signed)
Patient assistance forms have been signed by Dr. Margaretann Loveless and faxed to patient assistance foundation.

## 2020-09-22 ENCOUNTER — Encounter (HOSPITAL_COMMUNITY): Payer: Self-pay | Admitting: Emergency Medicine

## 2020-09-22 ENCOUNTER — Emergency Department (HOSPITAL_COMMUNITY)
Admission: EM | Admit: 2020-09-22 | Discharge: 2020-09-22 | Disposition: A | Discharge status: Home / Self Care | Payer: Medicare Other | Attending: Emergency Medicine | Admitting: Emergency Medicine

## 2020-09-22 ENCOUNTER — Other Ambulatory Visit: Payer: Self-pay

## 2020-09-22 ENCOUNTER — Emergency Department (HOSPITAL_COMMUNITY): Payer: Medicare Other

## 2020-09-22 DIAGNOSIS — M545 Low back pain, unspecified: Secondary | ICD-10-CM | POA: Insufficient documentation

## 2020-09-22 DIAGNOSIS — I480 Paroxysmal atrial fibrillation: Secondary | ICD-10-CM | POA: Diagnosis not present

## 2020-09-22 DIAGNOSIS — Z96642 Presence of left artificial hip joint: Secondary | ICD-10-CM | POA: Diagnosis not present

## 2020-09-22 DIAGNOSIS — Z85028 Personal history of other malignant neoplasm of stomach: Secondary | ICD-10-CM | POA: Diagnosis not present

## 2020-09-22 DIAGNOSIS — R42 Dizziness and giddiness: Secondary | ICD-10-CM

## 2020-09-22 DIAGNOSIS — R112 Nausea with vomiting, unspecified: Secondary | ICD-10-CM | POA: Insufficient documentation

## 2020-09-22 DIAGNOSIS — R531 Weakness: Secondary | ICD-10-CM

## 2020-09-22 DIAGNOSIS — Z7901 Long term (current) use of anticoagulants: Secondary | ICD-10-CM | POA: Insufficient documentation

## 2020-09-22 DIAGNOSIS — Z79899 Other long term (current) drug therapy: Secondary | ICD-10-CM | POA: Diagnosis not present

## 2020-09-22 DIAGNOSIS — R001 Bradycardia, unspecified: Secondary | ICD-10-CM | POA: Diagnosis not present

## 2020-09-22 LAB — CBC
HCT: 39.2 % (ref 36.0–46.0)
Hemoglobin: 12.4 g/dL (ref 12.0–15.0)
MCH: 30.8 pg (ref 26.0–34.0)
MCHC: 31.6 g/dL (ref 30.0–36.0)
MCV: 97.3 fL (ref 80.0–100.0)
Platelets: 273 10*3/uL (ref 150–400)
RBC: 4.03 MIL/uL (ref 3.87–5.11)
RDW: 13.6 % (ref 11.5–15.5)
WBC: 4.6 10*3/uL (ref 4.0–10.5)
nRBC: 0 % (ref 0.0–0.2)

## 2020-09-22 LAB — COMPREHENSIVE METABOLIC PANEL
ALT: 14 U/L (ref 0–44)
AST: 23 U/L (ref 15–41)
Albumin: 3.3 g/dL — ABNORMAL LOW (ref 3.5–5.0)
Alkaline Phosphatase: 74 U/L (ref 38–126)
Anion gap: 5 (ref 5–15)
BUN: 16 mg/dL (ref 8–23)
CO2: 29 mmol/L (ref 22–32)
Calcium: 9 mg/dL (ref 8.9–10.3)
Chloride: 105 mmol/L (ref 98–111)
Creatinine, Ser: 0.56 mg/dL (ref 0.44–1.00)
GFR, Estimated: >60 mL/min (ref >60)
Glucose, Bld: 143 mg/dL — ABNORMAL HIGH (ref 70–99)
Potassium: 3.5 mmol/L (ref 3.5–5.1)
Sodium: 139 mmol/L (ref 135–145)
Total Bilirubin: 0.7 mg/dL (ref 0.3–1.2)
Total Protein: 6.3 g/dL — ABNORMAL LOW (ref 6.5–8.1)

## 2020-09-22 LAB — URINALYSIS, ROUTINE W REFLEX MICROSCOPIC
Bilirubin Urine: NEGATIVE
Glucose, UA: NEGATIVE mg/dL
Hgb urine dipstick: NEGATIVE
Ketones, ur: 5 mg/dL — AB
Leukocytes,Ua: NEGATIVE
Nitrite: NEGATIVE
Protein, ur: NEGATIVE mg/dL
Specific Gravity, Urine: 1.01 (ref 1.005–1.030)
pH: 6 (ref 5.0–8.0)

## 2020-09-22 LAB — LIPASE, BLOOD: Lipase: 23 U/L (ref 11–51)

## 2020-09-22 MED ORDER — ONDANSETRON 4 MG PO TBDP: 4.0000 mg | ORAL_TABLET | Freq: Three times a day (TID) | ORAL | 0 refills | Status: DC | PRN

## 2020-09-22 NOTE — ED Notes (Signed)
EDP at bedside  

## 2020-09-22 NOTE — ED Triage Notes (Signed)
Pt to triage via [facility] from home.  Per EMS pt has nausea, vomiting, generalized weakness, and dizziness that started while using bathroom.  Hx of Afib.  Pt reports back pain since falling 2 days ago.  20g LAC and Zofran 4mg  PTA.

## 2020-09-22 NOTE — ED Notes (Signed)
Patient transported to CT 

## 2020-09-22 NOTE — ED Notes (Signed)
EDP aware of pt's pulse remaining Bradycardic.

## 2020-09-22 NOTE — Discharge Instructions (Signed)
Please contact the cardiology clinic to schedule follow-up appointment for your slower heart rate (bradycardia) as it can cause dizziness/weakness feeling.

## 2020-09-22 NOTE — ED Notes (Signed)
Pt provided water and applesauce. Pt tolerated food with no problems and swallowed with no difficulties.

## 2020-09-22 NOTE — ED Notes (Signed)
Pt had IV that was not charted. IV was removed at discharge.

## 2020-09-22 NOTE — ED Provider Notes (Signed)
[facility] EMERGENCY DEPARTMENT Provider Note   CSN: [id number] Arrival date & time: 09/22/20  1433     History Chief Complaint  Patient presents with  . Weakness  . Emesis    Monica Ward is a 87 y.o. female.  87 year old female with extensive past medical history below including CVA, leiomyosarcoma, SBO, hyperlipidemia who presents with weakness and vomiting.  Patient is a poor historian and initially did not recall why she was here in the ED.  With further prompting about EMSs story, the patient did remember that she was feeling poorly at the house earlier today and asked to be taken to the hospital.  She states that her son normally lives with her but is out of town this weekend so she had relatives staying with her who helped call EMS.  EMS stated that patient had reported nausea, vomiting, and generalized weakness associated with dizziness that started while using the bathroom.  Patient unable to recall specific details of this.  They also reported that she had a fall 2 days ago and has been having back pain since then.  Patient remembers falling but does not remember details and is not sure whether she hit her head.  She reports mild back pain but has been ambulatory and able to function since the fall.  She denies any chest pain, breathing problems, abdominal pain, or nausea currently.  She denies urinary symptoms.  The history is provided by the patient and the EMS personnel.  Weakness Associated symptoms: vomiting   Emesis      Past Medical History:  Diagnosis Date  . Arthritis    "probably" (01/28/2012)  . Cancer ([place]) Around 2002-2003   Unclear history of "7 lb" abdominal tumor treated at [facility] ~2003 s/p open abdominal colonic resection and radiation  . Complication of anesthesia    "trouble waking up; too much med given" (01/28/2012)  . Constipation 02/03/2014  . Constipation, chronic 02/06/2014  . Diverticulitis    Per chart   . Diverticulosis  02/03/2014  . High cholesterol   . 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 Patient had surgery in 2000 and mass which was presumed to be a leiomyosarcoma rather than a spindle cell cancer. I have no further records of path reports to give me a good definite path diagnosis of her tumor resection.   . Precordial pain 02/12/2012  . SBO (small bowel obstruction) ([place]) 08/2009   Early, partial SBO improved with conservative Tx     Patient Active Problem List   Diagnosis Date Noted  . Paroxysmal atrial fibrillation ([place]) 08/15/2020  . Acute lower UTI 07/18/2020  . CVA (cerebral vascular accident) ([place]) 07/18/2020  . Acute ischemic right MCA stroke ([place]) 07/17/2020  . History of hip fracture 10/02/2016  . 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 History of small bowel obstruction 10/02/2016  . Age-related osteoporosis without current pathological fracture 10/02/2016  . Moderate malnutrition ([place]) 02/05/2015  . Thrombocytopenia ([place]) 02/02/2015  . High cholesterol   . Constipation, chronic 02/06/2014  . Diverticulosis 02/03/2014  . B12 deficiency 02/03/2014    Past Surgical History:  Procedure Laterality Date  . ABDOMINAL HYSTERECTOMY  ~ 1985   ovaries resected B; DUB.  [facility] APPENDECTOMY  1968  . CATARACT EXTRACTION, BILATERAL  09/27/2015  . [place]  . COLECTOMY  2003   "cancer"  . TONSILLECTOMY     "when I was a child"  .  TOTAL HIP ARTHROPLASTY Left 01/31/2015   Procedure:  ANTERIOR APPROACH  LEFT HEMI ARTHROPLASTY;  Surgeon: Rod Can, MD;  Location: [facility] ORS;  Service: Orthopedics;  Laterality: Left;  . TUMOR REMOVAL  1990's;  X 2   "benign"     OB History   No obstetric history on file.     Family History  Problem Relation Age of Onset  . Heart failure Father 84       Heart failure  . Heart disease Father     Social History   Tobacco Use  . Smoking status: Never Smoker  . Smokeless tobacco: Never Used   Substance Use Topics  . Alcohol use: Yes    Alcohol/week: 3.0 standard drinks    Types: 3 Glasses of wine per week    Comment: social  . Drug use: No    Home Medications Prior to Admission medications   Medication Sig Start Date End Date Taking? Authorizing Provider  apixaban (ELIQUIS) 2.5 MG TABS tablet Take 1 tablet (2.5 mg total) by mouth 2 (two) times daily. 08/20/20  Yes Elouise Munroe, MD  atorvastatin (LIPITOR) 80 MG tablet Take 1 tablet (80 mg total) by mouth daily. Patient taking differently: Take 80 mg by mouth at bedtime. 07/20/20  Yes Charlynne Cousins, MD  ondansetron (ZOFRAN ODT) 4 MG disintegrating tablet Take 1 tablet (4 mg total) by mouth every 8 (eight) hours as needed for nausea or vomiting. 09/22/20  Yes Chianti Goh, Wenda Overland, MD  polyethylene glycol (MIRALAX / GLYCOLAX) 17 g packet Take 17 g by mouth daily as needed for mild constipation.   Yes [provider]  metoprolol tartrate (LOPRESSOR) 25 MG tablet Take 0.5 tablets (12.5 mg total) by mouth 2 (two) times daily. Patient not taking: Reported on 09/22/2020 07/20/20   Charlynne Cousins, MD    Allergies    Patient has no known allergies.  Review of Systems   Review of Systems  Unable to perform ROS: Other  Gastrointestinal: Positive for vomiting.  Neurological: Positive for weakness.  memory problems  Physical Exam Updated Vital Signs BP (!) 133/55 (BP Location: Right Arm)   Pulse (!) 54   Temp (!) 97.5 F (36.4 C) (Oral)   Resp 17   SpO2 92%   Physical Exam Constitutional:      General: She is not in acute distress.    Appearance: Normal appearance.  HENT:     Head: Normocephalic and atraumatic.  Eyes:     Conjunctiva/sclera: Conjunctivae normal.  Cardiovascular:     Rate and Rhythm: Normal rate and regular rhythm.     Heart sounds: Normal heart sounds. No murmur heard.   Pulmonary:     Effort: Pulmonary effort is normal.     Breath sounds: Normal breath sounds.  Abdominal:      General: Abdomen is flat. Bowel sounds are normal. There is no distension.     Palpations: Abdomen is soft.     Tenderness: There is no abdominal tenderness.  Musculoskeletal:     Right lower leg: No edema.     Left lower leg: No edema.     Comments: No focal midline tenderness of spine  Skin:    General: Skin is warm and dry.  Neurological:     Mental Status: She is alert and oriented to person, place, and time.     Comments: fluent  Psychiatric:        Mood and Affect: Mood normal.  Behavior: Behavior normal.        Cognition and Memory: Memory is impaired.     ED Results / Procedures / Treatments   Labs (all labs ordered are listed, but only abnormal results are displayed) Labs Reviewed  COMPREHENSIVE METABOLIC PANEL - Abnormal; Notable for the following components:      Result Value   Glucose, Bld 143 (*)    Total Protein 6.3 (*)    Albumin 3.3 (*)    All other components within normal limits  URINALYSIS, ROUTINE W REFLEX MICROSCOPIC - Abnormal; Notable for the following components:   Ketones, ur 5 (*)    All other components within normal limits  LIPASE, BLOOD  CBC    EKG EKG Interpretation  Date/Time:  Saturday Sep 22 2020 15:12:58 EDT Ventricular Rate:  52 PR Interval:  140 QRS Duration: 82 QT Interval:  480 QTC Calculation: 446 R Axis:   -48 Text Interpretation: Sinus bradycardia Left anterior fascicular block Abnormal ECG rate slower but similar to previous Confirmed by Theotis Burrow [phone]) on 09/22/2020 3:32:29 PM   Radiology CT Head Wo Contrast  Result Date: 09/22/2020 CLINICAL DATA:  Altered EXAM: CT HEAD WITHOUT CONTRAST TECHNIQUE: Contiguous axial images were obtained from the base of the skull through the vertex without intravenous contrast. COMPARISON:  August 07, 2020, July 07, 2020 FINDINGS: Brain: Continued evolution of RIGHT MCA infarction with increased conspicuity of hypodensity involving the RIGHT frontal and parietal lobes. No  acute hemorrhage or extra-axial fluid collection. Unchanged size and configuration of the ventricular system. Cavum septum pellucidum et vergae. Periventricular white matter hypodensities consistent with sequela of chronic microvascular ischemic disease. Global parenchymal volume loss. Vascular: Vascular calcifications. Skull: Normal. Negative for fracture or focal lesion. Sinuses/Orbits: No acute finding. Other: None. IMPRESSION: Expected evolution of prior RIGHT MCA infarction. No acute intracranial abnormality. Electronically Signed   By: Valentino Saxon MD   On: 09/22/2020 17:15    Procedures Procedures   Medications Ordered in ED Medications - No data to display  ED Course  I have reviewed the triage vital signs and the nursing notes.  Pertinent labs & imaging results that were available during my care of the patient were reviewed by me and considered in my medical decision making (see chart for details).    MDM Rules/Calculators/A&P                          Pt well appearing, denying complaints on exam. She refused back X rays.  Obtain head CT due to unclear details of recent fall, head CT negative acute.  Lab work including CMP, CBC, UA reassuring.  I discussed patient with son over the phone as well as other son at bedside.  It sounds like she does have history of occasional memory problems.  She has had no vomiting here and has been tolerating applesauce and drink with no problems.  She denies complaints on reassessment.  Provided with Zofran to use as needed at home and extensively reviewed return precautions with son who voiced understanding. Final Clinical Impression(s) / ED Diagnoses Final diagnoses:  Non-intractable vomiting with nausea, unspecified vomiting type  Weakness  Dizziness  Bradycardia    Rx / DC Orders ED Discharge Orders         Ordered    ondansetron (ZOFRAN ODT) 4 MG disintegrating tablet  Every 8 hours PRN        09/22/20 1937  Teddi Badalamenti,  Wenda Overland, MD 09/23/20 [phone]

## 2020-10-11 ENCOUNTER — Emergency Department (HOSPITAL_COMMUNITY)
Admission: EM | Admit: 2020-10-11 | Discharge: 2020-10-11 | Disposition: A | Discharge status: Home / Self Care | Payer: Medicare Other | Attending: Emergency Medicine | Admitting: Emergency Medicine

## 2020-10-11 ENCOUNTER — Encounter (HOSPITAL_COMMUNITY): Payer: Self-pay | Admitting: Radiology

## 2020-10-11 ENCOUNTER — Emergency Department (HOSPITAL_COMMUNITY): Payer: Medicare Other

## 2020-10-11 DIAGNOSIS — K5900 Constipation, unspecified: Secondary | ICD-10-CM | POA: Insufficient documentation

## 2020-10-11 DIAGNOSIS — Z7982 Long term (current) use of aspirin: Secondary | ICD-10-CM | POA: Diagnosis not present

## 2020-10-11 DIAGNOSIS — R109 Unspecified abdominal pain: Secondary | ICD-10-CM | POA: Diagnosis present

## 2020-10-11 DIAGNOSIS — R413 Other amnesia: Secondary | ICD-10-CM | POA: Insufficient documentation

## 2020-10-11 DIAGNOSIS — Z96642 Presence of left artificial hip joint: Secondary | ICD-10-CM | POA: Insufficient documentation

## 2020-10-11 DIAGNOSIS — Z7901 Long term (current) use of anticoagulants: Secondary | ICD-10-CM | POA: Insufficient documentation

## 2020-10-11 DIAGNOSIS — I480 Paroxysmal atrial fibrillation: Secondary | ICD-10-CM | POA: Insufficient documentation

## 2020-10-11 DIAGNOSIS — Z859 Personal history of malignant neoplasm, unspecified: Secondary | ICD-10-CM | POA: Insufficient documentation

## 2020-10-11 LAB — CBC WITH DIFFERENTIAL/PLATELET
Abs Immature Granulocytes: 0 10*3/uL (ref 0.00–0.07)
Basophils Absolute: 0 10*3/uL (ref 0.0–0.1)
Basophils Relative: 0 %
Eosinophils Absolute: 0.2 10*3/uL (ref 0.0–0.5)
Eosinophils Relative: 4 %
HCT: 38.6 % (ref 36.0–46.0)
Hemoglobin: 12.6 g/dL (ref 12.0–15.0)
Immature Granulocytes: 0 %
Lymphocytes Relative: 20 %
Lymphs Abs: 1 10*3/uL (ref 0.7–4.0)
MCH: 31.5 pg (ref 26.0–34.0)
MCHC: 32.6 g/dL (ref 30.0–36.0)
MCV: 96.5 fL (ref 80.0–100.0)
Monocytes Absolute: 0.4 10*3/uL (ref 0.1–1.0)
Monocytes Relative: 9 %
Neutro Abs: 3.3 10*3/uL (ref 1.7–7.7)
Neutrophils Relative %: 67 %
Platelets: 279 10*3/uL (ref 150–400)
RBC: 4 MIL/uL (ref 3.87–5.11)
RDW: 13.8 % (ref 11.5–15.5)
WBC: 4.9 10*3/uL (ref 4.0–10.5)
nRBC: 0 % (ref 0.0–0.2)

## 2020-10-11 LAB — COMPREHENSIVE METABOLIC PANEL
ALT: 25 U/L (ref 0–44)
AST: 26 U/L (ref 15–41)
Albumin: 3.6 g/dL (ref 3.5–5.0)
Alkaline Phosphatase: 67 U/L (ref 38–126)
Anion gap: 9 (ref 5–15)
BUN: 18 mg/dL (ref 8–23)
CO2: 24 mmol/L (ref 22–32)
Calcium: 8.9 mg/dL (ref 8.9–10.3)
Chloride: 106 mmol/L (ref 98–111)
Creatinine, Ser: 0.6 mg/dL (ref 0.44–1.00)
GFR, Estimated: >60 mL/min (ref >60)
Glucose, Bld: 86 mg/dL (ref 70–99)
Potassium: 3.7 mmol/L (ref 3.5–5.1)
Sodium: 139 mmol/L (ref 135–145)
Total Bilirubin: 0.7 mg/dL (ref 0.3–1.2)
Total Protein: 6.3 g/dL — ABNORMAL LOW (ref 6.5–8.1)

## 2020-10-11 LAB — LIPASE, BLOOD: Lipase: 25 U/L (ref 11–51)

## 2020-10-11 MED ORDER — ONDANSETRON HCL 4 MG/2ML IJ SOLN
4.0000 mg | Freq: Once | INTRAMUSCULAR | Status: DC
  Filled 2020-10-11: qty 2

## 2020-10-11 MED ORDER — IOHEXOL 300 MG/ML  SOLN: 75.0000 mL | Freq: Once | INTRAMUSCULAR | Status: DC | PRN

## 2020-10-11 MED ORDER — SODIUM CHLORIDE (PF) 0.9 % IJ SOLN
INTRAMUSCULAR | Status: AC
  Filled 2020-10-11: qty 50

## 2020-10-11 MED ORDER — SODIUM CHLORIDE 0.9 % IV SOLN: Freq: Once | INTRAVENOUS | Status: AC

## 2020-10-11 MED ORDER — SENNOSIDES-DOCUSATE SODIUM 8.6-50 MG PO TABS: 1.0000 | ORAL_TABLET | Freq: Every evening | ORAL | 0 refills | Status: DC | PRN

## 2020-10-11 MED ORDER — SORBITOL 70 % SOLN
960.0000 mL | TOPICAL_OIL | Freq: Once | ORAL | Status: AC
  Administered 2020-10-11: 960 mL via RECTAL
  Filled 2020-10-11: qty 473

## 2020-10-11 NOTE — ED Provider Notes (Signed)
[facility] DEPT Provider Note   CSN: [id number] Arrival date & time: 10/11/20  4098     History Chief Complaint  Patient presents with   Abdominal Pain    Monica Ward is a 87 y.o. female.  Pt presents to the ED today with abdominal pain.  Pt is a very poor historian.  EMS said she has been having abdominal pain for awhile.  She has had constipation and diarrhea.  She is not sure what prompted her to have EMS bring her in today.  She denies any current pain.  She lives with her son.  Pt's son is here now.  She has been very constipated.  She was getting treated with Miralax, but that caused severe diarrhea.  So, the Miralax was stopped.  She was ok for awhile, but now is constipated.  He is interested in learning about dietary changes to help with the constipation.      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 Patient had surgery in 2000 and mass which was presumed to be a leiomyosarcoma rather than a spindle cell cancer. I have no further records of path reports to give me a good definite path diagnosis of her tumor resection.    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   APPENDECTOMY  1968   CATARACT EXTRACTION, BILATERAL  09/27/2015   CESAREAN SECTION  1968   COLECTOMY  2003   "cancer"   TONSILLECTOMY     "when I was a child"   TOTAL HIP ARTHROPLASTY Left 01/31/2015   Procedure:  ANTERIOR APPROACH  LEFT HEMI ARTHROPLASTY;  Surgeon: Rod Can, MD;  Location: [facility];  Service: Orthopedics;  Laterality: Left;   TUMOR REMOVAL  1990's;  X 2   "benign"     OB History   No obstetric history on file.     Family History  Problem Relation Age of Onset   Heart failure Father 6       Heart failure   Heart disease Father     Social History   Tobacco Use   Smoking status: Never   Smokeless tobacco:  Never  Substance Use Topics   Alcohol use: Yes    Alcohol/week: 3.0 standard drinks    Types: 3 Glasses of wine per week    Comment: social   Drug use: No    Home Medications Prior to Admission medications   Medication Sig Start Date End Date Taking? Authorizing Provider  apixaban (ELIQUIS) 2.5 MG TABS tablet Take 1 tablet (2.5 mg total) by mouth 2 (two) times daily. 08/20/20  Yes Elouise Munroe, MD  aspirin 325 MG tablet Take 325 mg by mouth once.   Yes [provider]  atorvastatin (LIPITOR) 80 MG tablet Take 1 tablet (80 mg total) by mouth daily. Patient taking differently: Take 80 mg by mouth at bedtime. 07/20/20  Yes Charlynne Cousins, MD  polyethylene glycol (MIRALAX / GLYCOLAX) 17  g packet Take 17 g by mouth daily as needed for mild constipation.   Yes [provider]  senna-docusate (SENOKOT-S) 8.6-50 MG tablet Take 1 tablet by mouth at bedtime as needed for moderate constipation. 10/11/20  Yes Isla Pence, MD  simethicone (MYLICON) 80 MG chewable tablet Chew 80 mg by mouth every 6 (six) hours as needed for flatulence.   Yes [provider]  metoprolol tartrate (LOPRESSOR) 25 MG tablet Take 0.5 tablets (12.5 mg total) by mouth 2 (two) times daily. Patient not taking: No sig reported 07/20/20   Charlynne Cousins, MD  ondansetron (ZOFRAN ODT) 4 MG disintegrating tablet Take 1 tablet (4 mg total) by mouth every 8 (eight) hours as needed for nausea or vomiting. 09/22/20   Little, Wenda Overland, MD    Allergies    Patient has no known allergies.  Review of Systems   Review of Systems  Gastrointestinal:  Positive for abdominal pain.  All other systems reviewed and are negative.  Physical Exam Updated Vital Signs BP (!) 120/45   Pulse (!) 53   Temp 97.8 F (36.6 C) (Oral)   Resp 18   SpO2 97%   Physical Exam Vitals and nursing note reviewed.  Constitutional:      Appearance: She is underweight.  HENT:     Head: Normocephalic and atraumatic.     Mouth/Throat:     Mouth: Mucous membranes are dry.  Cardiovascular:     Rate and Rhythm: Normal rate and regular rhythm.  Pulmonary:     Effort: Pulmonary effort is normal.     Breath sounds: Normal breath sounds.  Abdominal:     General: Abdomen is flat. Bowel sounds are normal.     Palpations: Abdomen is soft.     Tenderness: There is generalized abdominal tenderness.  Skin:    General: Skin is warm.     Capillary Refill: Capillary refill takes less than 2 seconds.  Neurological:     Mental Status: She is alert.     Comments: Pt is moving all 4 extremities  Psychiatric:        Cognition and Memory: Memory is impaired.    ED Results / Procedures / Treatments   Labs (all labs ordered  are listed, but only abnormal results are displayed) Labs Reviewed  COMPREHENSIVE METABOLIC PANEL - Abnormal; Notable for the following components:      Result Value   Total Protein 6.3 (*)    All other components within normal limits  CBC WITH DIFFERENTIAL/PLATELET  LIPASE, BLOOD  URINALYSIS, ROUTINE W REFLEX MICROSCOPIC    EKG None  Radiology No results found.  Procedures Procedures   Medications Ordered in ED  Medications  ondansetron (ZOFRAN) injection 4 mg (4 mg Intravenous Patient Refused/Not Given 10/11/20 1008)  iohexol (OMNIPAQUE) 300 MG/ML solution 75 mL (has no administration in time range)  0.9 %  sodium chloride infusion ( Intravenous New Bag/Given 10/11/20 1007)  sodium chloride (PF) 0.9 % injection (  Given by Other 10/11/20 1117)  sorbitol, milk of mag, mineral oil, glycerin (SMOG) enema (960 mLs Rectal Given 10/11/20 1130)    ED Course  I have reviewed the triage vital signs and the nursing notes.  Pertinent labs & imaging results that were available during my care of the patient were reviewed by me and considered in my medical decision making (see chart for details).    MDM Rules/Calculators/A&P                          Pt had a large bm after enema.  She is feeling better and is eating a full meal.  Pt is stable for d/c.  Return if worse.  Final Clinical Impression(s) / ED Diagnoses Final diagnoses:  Constipation, unspecified constipation type    Rx / DC Orders ED Discharge Orders          Ordered    senna-docusate (SENOKOT-S) 8.6-50 MG tablet  At bedtime PRN        10/11/20 1340             Isla Pence, MD 10/11/20 1341

## 2020-10-11 NOTE — ED Triage Notes (Signed)
Ems brings pt in from home for abdominal pain. States the abdominal pain has been going on for a while.

## 2020-10-16 ENCOUNTER — Encounter: Payer: Self-pay | Admitting: Physician Assistant

## 2020-10-16 ENCOUNTER — Ambulatory Visit (INDEPENDENT_AMBULATORY_CARE_PROVIDER_SITE_OTHER): Payer: Medicare Other | Admitting: Physician Assistant

## 2020-10-16 ENCOUNTER — Other Ambulatory Visit: Payer: Self-pay

## 2020-10-16 VITALS — BP 96/51 | HR 59

## 2020-10-16 DIAGNOSIS — Z8673 Personal history of transient ischemic attack (TIA), and cerebral infarction without residual deficits: Secondary | ICD-10-CM | POA: Diagnosis not present

## 2020-10-16 DIAGNOSIS — E7800 Pure hypercholesterolemia, unspecified: Secondary | ICD-10-CM

## 2020-10-16 DIAGNOSIS — I480 Paroxysmal atrial fibrillation: Secondary | ICD-10-CM

## 2020-10-16 NOTE — Progress Notes (Signed)
Cardiology Office Note:    Date:  10/18/2020   ID:  Monica Ward, DOB [birth year], MRN [id number]  PCP:  Pa, Arthur Providers Cardiologist:  Elouise Munroe, MD     Referring MD: [facility]*   Chief Complaint  Patient presents with   Follow-up    Seen for Dr. Margaretann Loveless     History of Present Illness:    Monica Ward is a 87 y.o. female with a hx of PAF, CVA, hyperlipidemia, chronic constipation, small bowel obstruction and leiomyosarcoma.  Patient had a history of atypical chest pain secondary to costochondritis in 2013.  More recently, she was admitted in March for acute CVA after found down at home.  Echocardiogram showed EF 60 to 65%, normal wall motion, grade 2 DD.  Initial plan was for placement of loop recorder however patient was noted to be going in and out of atrial fibrillation on telemetry.  Therefore she was started on Eliquis and Lopressor.  She also took antibiotic for possible urinary tract infection and was eventually discharged to skilled nursing facility.  In April, she returned to the ED after fall at [place].  X-ray showed no acute finding.  She was last seen by Sande Rives on 08/16/2018 at which time she was doing well.  She was already discharged home from skilled nursing facility.  She has been taking off of metoprolol by the skilled nursing facility.  More recently, patient presented to the hospital ED on 09/14/2020 with nausea, vomiting and weakness.  CT of the head showed prior right MCA infarct, no acute finding.  Urinalysis was negative renal function and electrolyte were normal.  She was provided as needed Zofran to help with nausea.  She was seen again by ED on 10/11/2020 due to abdominal discomfort related to constipation.  Symptom improved after bowel movement.  Patient presents today along with her daughter.  She denies any significant chest pain or palpitation.  Her primary  complaint is actually constipation and bloating.  She is taking MiraLAX and senna.  On exam, her heart rate is regular suggestive of sinus rhythm.  She is not on any rate controlling agent, systolic blood pressure is borderline low.  She does have some dizziness upon standing, this likely contributed to the initial fall.  We discussed proper fall precautions.  She will continue on the Eliquis and Lipitor.  She is due for fasting lipid panel and LFT.  Otherwise she has been stable from cardiac perspective and can follow-up with Dr. Margaretann Loveless in 3 to 5 months.   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 Patient had surgery in 2000 and mass which was presumed to be a leiomyosarcoma rather than a spindle cell cancer. I have no further records of path reports to give me a good definite path diagnosis of her tumor resection.    Precordial pain 02/12/2012   SBO (small bowel obstruction) (  [place]) 08/2009   Early, partial SBO improved with conservative Tx     Past Surgical History:  Procedure Laterality Date   ABDOMINAL HYSTERECTOMY  ~ 1985   ovaries resected B; DUB.   APPENDECTOMY  1968   CATARACT EXTRACTION, BILATERAL  09/27/2015   CESAREAN SECTION  1968   COLECTOMY  2003   "cancer"   TONSILLECTOMY     "when I was a child"   TOTAL HIP ARTHROPLASTY Left 01/31/2015   Procedure:  ANTERIOR APPROACH  LEFT HEMI ARTHROPLASTY;  Surgeon: Rod Can, MD;  Location: [facility];  Service: Orthopedics;  Laterality: Left;    TUMOR REMOVAL  1990's;  X 2   "benign"    Current Medications: Current Meds  Medication Sig   apixaban (ELIQUIS) 2.5 MG TABS tablet Take 1 tablet (2.5 mg total) by mouth 2 (two) times daily.   atorvastatin (LIPITOR) 80 MG tablet Take 1 tablet (80 mg total) by mouth daily. (Patient taking differently: Take 80 mg by mouth at bedtime.)   metoprolol tartrate (LOPRESSOR) 25 MG tablet Take 0.5 tablets (12.5 mg total) by mouth 2 (two) times daily.   ondansetron (ZOFRAN ODT) 4 MG disintegrating tablet Take 1 tablet (4 mg total) by mouth every 8 (eight) hours as needed for nausea or vomiting.   polyethylene glycol (MIRALAX / GLYCOLAX) 17 g packet Take 17 g by mouth daily as needed for mild constipation.   senna-docusate (SENOKOT-S) 8.6-50 MG tablet Take 1 tablet by mouth at bedtime as needed for moderate constipation.   simethicone (MYLICON) 80 MG chewable tablet Chew 80 mg by mouth every 6 (six) hours as needed for flatulence.   [DISCONTINUED] aspirin 325 MG tablet Take 325 mg by mouth once.     Allergies:   Patient has no known allergies.   Social History   Socioeconomic History   Marital status: Married    Spouse name: Not on file   Number of children: 3   Years of education: Not on file   Highest education level: Not on file  Occupational History   Occupation: retired  Tobacco Use   Smoking status: Never   Smokeless tobacco: Never  Substance and Sexual Activity   Alcohol use: Yes    Alcohol/week: 3.0 standard drinks    Types: 3 Glasses of wine per week    Comment: social   Drug use: No   Sexual activity: Not Currently  Other Topics Concern   Not on file  Social History Narrative      Marital status: widowed in 08/2015; married x 60 yyears.      Children: 3 children in area; 4 grandchildren; no gg.      Lives: alone in [place].       Employment: Retired      Tobacco: smoked briefly in college      Alcohol: 1-2 glass of wine per week.      Exercise:  Very little in 2018;  cleans house weekly; takes care garden in back and flowers.      ADLs: performs all ADLs; still drives.  No assistant devices.  Cleans townhome.  Does own grocery shopping.  Pay bills.        Advanced Directives:  FULL CODE; no prolonged measures.  HCPOA: son/David Heming         Social Determinants of Health   Financial Resource Strain: Not on file  Food Insecurity: Not on file  Transportation Needs: Not on file  Physical Activity: Not on file  Stress:  Not on file  Social Connections: Not on file     Family History: The patient's family history includes Heart disease in her father; Heart failure (age of onset: 51) in her father.  ROS:   Please see the history of present illness.     All other systems reviewed and are negative.  EKGs/Labs/Other Studies Reviewed:    The following studies were reviewed today:  Echo 07/18/2020  1. Left ventricular ejection fraction, by estimation, is 60 to 65%. The  left ventricle has normal function. The left ventricle has no regional  wall motion abnormalities. Left ventricular diastolic parameters are  consistent with Grade II diastolic  dysfunction (pseudonormalization). Elevated left atrial pressure.   2. Right ventricular systolic function is normal. The right ventricular  size is normal. There is mildly elevated pulmonary artery systolic  pressure. The estimated right ventricular systolic pressure is 42.3 mmHg.   3. Left atrial size was moderately dilated.   4. The mitral valve is normal in structure. No evidence of mitral valve  regurgitation.   5. The aortic valve is tricuspid. Aortic valve regurgitation is not  visualized. Mild aortic valve sclerosis is present, with no evidence of  aortic valve stenosis.   6. The inferior vena cava is dilated in size with >50% respiratory  variability, suggesting right atrial pressure of 8 mmHg.   EKG:  EKG is not ordered today.    Recent Labs: 07/19/2020: Magnesium 2.1; TSH 2.386 10/11/2020: ALT  25; BUN 18; Creatinine, Ser 0.60; Hemoglobin 12.6; Platelets 279; Potassium 3.7; Sodium 139  Recent Lipid Panel    Component Value Date/Time   CHOL 243 (H) 07/17/2020 1222   CHOL 212 (H) 03/02/2019 1645   TRIG 76 07/17/2020 1222   HDL 68 07/17/2020 1222   HDL 66 03/02/2019 1645   CHOLHDL 3.6 07/17/2020 1222   VLDL 15 07/17/2020 1222   LDLCALC 160 (H) 07/17/2020 1222   LDLCALC 132 (H) 03/02/2019 1645     Risk Assessment/Calculations:    CHA2DS2-VASc Score = 6  This indicates a 9.7% annual risk of stroke. The patient's score is based upon: CHF History: No HTN History: Yes Diabetes History: No Stroke History: Yes Vascular Disease History: No Age Score: 2 Gender Score: 1          Physical Exam:    VS:  BP (!) 96/51   Pulse (!) 59   SpO2 97%     Wt Readings from Last 3 Encounters:  08/16/20 104 lb (47.2 kg)  08/15/20 103 lb 9.6 oz (47 kg)  07/17/20 108 lb 14.5 oz (49.4 kg)     GEN:  Well nourished, well developed in no acute distress HEENT: Normal NECK: No JVD; No carotid bruits LYMPHATICS: No lymphadenopathy CARDIAC: RRR, no murmurs, rubs, gallops RESPIRATORY:  Clear to auscultation without rales, wheezing or rhonchi  ABDOMEN: Soft, non-tender, non-distended MUSCULOSKELETAL:  No edema; No deformity  SKIN: Warm and dry NEUROLOGIC:  Alert and oriented x 3 PSYCHIATRIC:  Normal affect   ASSESSMENT:    1. PAF (paroxysmal atrial fibrillation) ([place])   2. High cholesterol   3. H/O: CVA (cerebrovascular accident)    PLAN:    In order of problems listed above:  PAF: Not on any rate controlling medication.  Maintaining sinus rhythm based on physical exam.  Continue Eliquis.  I did not try to add a rate controlling medication because her blood pressure is borderline and she is at significant fall risk.  Proper fall precaution has been discussed  with the patient and her daughter.  Hyperlipidemia: On Lipitor 80 mg daily.  Obtain fasting lipid panel and  LFT  History of CVA: Occurred earlier this year.  No recurrence since then.        Medication Adjustments/Labs and Tests Ordered: Current medicines are reviewed at length with the patient today.  Concerns regarding medicines are outlined above.  Orders Placed This Encounter  Procedures   Lipid panel   Hepatic function panel    No orders of the defined types were placed in this encounter.   Patient Instructions  Medication Instructions:  No Changes *If you need a refill on your cardiac medications before your next appointment, please call your pharmacy*   Lab Work: Lipid Panel, Hepatic Panel If you have labs (blood work) drawn today and your tests are completely normal, you will receive your results only by: [place] (if you have MyChart) OR A paper copy in the mail If you have any lab test that is abnormal or we need to change your treatment, we will call you to review the results.   Testing/Procedures: No Testing    Follow-Up: At [facility], you and your health needs are our priority.  As part of our continuing mission to provide you with exceptional heart care, we have created designated Provider Care Teams.  These Care Teams include your primary Cardiologist (physician) and Advanced Practice Providers (APPs -  Physician Assistants and Nurse Practitioners) who all work together to provide you with the care you need, when you need it.  We recommend signing up for the patient portal called "MyChart".  Sign up information is provided on this After Visit Summary.  MyChart is used to connect with patients for Virtual Visits (Telemedicine).  Patients are able to view lab/test results, encounter notes, upcoming appointments, etc.  Non-urgent messages can be sent to your provider as well.   To learn more about what you can do with MyChart, go to [email or web address].    Your next appointment:   3-5 month(s)  The format for your next appointment:   In  Person  Provider:   Cherlynn Kaiser, MD       Signed, Almyra Deforest, [place]  10/18/2020 11:41 PM    [place]

## 2020-10-16 NOTE — Patient Instructions (Signed)
Medication Instructions:  No Changes *If you need a refill on your cardiac medications before your next appointment, please call your pharmacy*   Lab Work: Lipid Panel, Hepatic Panel If you have labs (blood work) drawn today and your tests are completely normal, you will receive your results only by: [place] (if you have MyChart) OR A paper copy in the mail If you have any lab test that is abnormal or we need to change your treatment, we will call you to review the results.   Testing/Procedures: No Testing    Follow-Up: At [facility], you and your health needs are our priority.  As part of our continuing mission to provide you with exceptional heart care, we have created designated Provider Care Teams.  These Care Teams include your primary Cardiologist (physician) and Advanced Practice Providers (APPs -  Physician Assistants and Nurse Practitioners) who all work together to provide you with the care you need, when you need it.  We recommend signing up for the patient portal called "MyChart".  Sign up information is provided on this After Visit Summary.  MyChart is used to connect with patients for Virtual Visits (Telemedicine).  Patients are able to view lab/test results, encounter notes, upcoming appointments, etc.  Non-urgent messages can be sent to your provider as well.   To learn more about what you can do with MyChart, go to [email or web address].    Your next appointment:   3-5 month(s)  The format for your next appointment:   In Person  Provider:   Cherlynn Kaiser, MD

## 2020-10-18 ENCOUNTER — Encounter: Payer: Self-pay | Admitting: Physician Assistant

## 2020-11-09 LAB — HEPATIC FUNCTION PANEL
ALT: 25 IU/L (ref 0–32)
AST: 29 IU/L (ref 0–40)
Albumin: 4.4 g/dL (ref 3.6–4.6)
Alkaline Phosphatase: 95 IU/L (ref 44–121)
Bilirubin Total: 0.6 mg/dL (ref 0.0–1.2)
Bilirubin, Direct: 0.17 mg/dL (ref 0.00–0.40)
Total Protein: 6.7 g/dL (ref 6.0–8.5)

## 2020-11-09 LAB — LIPID PANEL
Chol/HDL Ratio: 2.4 ratio (ref 0.0–4.4)
Cholesterol, Total: 145 mg/dL (ref 100–199)
HDL: 61 mg/dL (ref >39)
LDL Chol Calc (NIH): 70 mg/dL (ref 0–99)
Triglycerides: 68 mg/dL (ref 0–149)
VLDL Cholesterol Cal: 14 mg/dL (ref 5–40)

## 2020-11-12 ENCOUNTER — Encounter: Payer: Self-pay | Admitting: Physician Assistant

## 2020-11-19 ENCOUNTER — Encounter: Payer: Self-pay | Admitting: Adult Health

## 2020-11-19 ENCOUNTER — Ambulatory Visit: Payer: Medicare Other | Admitting: Adult Health

## 2021-02-26 ENCOUNTER — Encounter: Payer: Self-pay | Admitting: Internal Medicine

## 2021-02-26 ENCOUNTER — Other Ambulatory Visit: Payer: Self-pay

## 2021-02-26 ENCOUNTER — Ambulatory Visit (INDEPENDENT_AMBULATORY_CARE_PROVIDER_SITE_OTHER): Payer: Medicare Other | Admitting: Internal Medicine

## 2021-02-26 VITALS — BP 127/57 | HR 63 | Ht 64.0 in | Wt 100.4 lb

## 2021-02-26 DIAGNOSIS — I480 Paroxysmal atrial fibrillation: Secondary | ICD-10-CM

## 2021-02-26 DIAGNOSIS — E785 Hyperlipidemia, unspecified: Secondary | ICD-10-CM

## 2021-02-26 DIAGNOSIS — D6869 Other thrombophilia: Secondary | ICD-10-CM

## 2021-02-26 DIAGNOSIS — I6310 Cerebral infarction due to embolism of unspecified precerebral artery: Secondary | ICD-10-CM

## 2021-02-26 NOTE — Progress Notes (Signed)
Cardiology Office Note:    Date:  02/26/2021   ID:  Monica Ward, DOB 1929-09-09, MRN [id number]  PCP:  [facility]  Cardiologist:  Elouise Munroe, MD  Electrophysiologist:  None   Referring MD: Pa, [place] As*   Chief Complaint/Reason for Referral: PAF  History of Present Illness:    Monica Ward is a 87 y.o. female with a history of PAF, CVA, hyperlipidemia, chronic constipation, small bowel obstruction and leiomyosarcoma.  Patient had a history of atypical chest pain secondary to costochondritis in 2013.  She was admitted in 06/2020 for acute CVA after found down at home.  Echocardiogram at the time showed EF 60 to 65%, normal wall motion, grade 2 DD.  Initial plan was for placement of loop recorder however patient was noted to be going in and out of atrial fibrillation on telemetry.  Therefore she was started on Eliquis and Lopressor.  She also took antibiotic for possible urinary tract infection and was eventually discharged to skilled nursing facility.  In 07/2020, she returned to the ED after fall at [place].  X-ray showed no acute finding. She has been taken off of metoprolol by the skilled nursing facility.  Today, she is accompanied by her son and says she is doing great.   She transitioned from living on her own to assisted living in September but in her own apartment. In her new living conditions, she is walking more and interacting with other people. Usually, she uses a walker. However, she is using a wheelchair today.   She is compliant in taking her medications. She was taken off metoprolol a few months ago after a falling episode. We discussed ways to prevent falls.  She is cutting out snack foods and eating well at the assisted living facility. For food, she opts for salads and fruits, as well as applesauce. She has lost 4 lbs but feels this is not worrisome.  The patient denies chest pain, chest pressure, dyspnea  at rest or with exertion, PND, orthopnea, or leg swelling. Denies cough, fever, chills, nausea, or vomiting. Denies syncope, presyncope, or snoring. Denies dizziness or lightheadedness.    Patient presented to the hospital ED on 09/14/2020 with nausea, vomiting and weakness.  CT of the head showed prior right MCA infarct, no acute finding.  Urinalysis was negative renal function and electrolyte were normal.  She was provided as needed Zofran to help with nausea.  She was seen again by ED on 10/11/2020 due to abdominal discomfort related to constipation.  Symptom improved after bowel movement.  Last visit seen by Almyra Deforest PA on 10/16/20. She was doing well at the time and was continued on Eliquis and Lipitor.   Past Medical History:  Diagnosis Date   Arthritis    "probably" (01/28/2012)   Cancer ([place]) Around 2002-2003   Unclear history of "7 lb" abdominal tumor treated at [facility] ~2003 s/p open abdominal colonic resection and radiation   Complication of anesthesia    "trouble waking up; too much med given" (01/28/2012)   Constipation 02/03/2014   Constipation, chronic 02/06/2014   Diverticulitis    Per chart    Diverticulosis 02/03/2014   High cholesterol    Leiomyosarcoma ([place]) 10/28/2011   Patient had surgery for a spindle cell tumor in 1999. Patient had surgery in 2000 and mass which was presumed to be a leiomyosarcoma rather than a spindle cell cancer. I have no further records of path reports to give me  a good definite path diagnosis of her tumor resection.    Precordial pain 02/12/2012   SBO (small bowel obstruction) ([place]) 08/2009   Early, partial SBO improved with conservative Tx     Past Surgical History:  Procedure Laterality Date   ABDOMINAL HYSTERECTOMY  ~ 1985   ovaries resected B; DUB.   APPENDECTOMY  1968   CATARACT EXTRACTION, BILATERAL  09/27/2015   CESAREAN SECTION  1968   COLECTOMY  2003   "cancer"   TONSILLECTOMY     "when I was a child"   TOTAL HIP ARTHROPLASTY Left  01/31/2015   Procedure:  ANTERIOR APPROACH  LEFT HEMI ARTHROPLASTY;  Surgeon: Rod Can, MD;  Location: [facility];  Service: Orthopedics;  Laterality: Left;   TUMOR REMOVAL  1990's;  X 2   "benign"    Current Medications: Current Meds  Medication Sig   apixaban (ELIQUIS) 2.5 MG TABS tablet Take 1 tablet (2.5 mg total) by mouth 2 (two) times daily.   atorvastatin (LIPITOR) 80 MG tablet Take 1 tablet (80 mg total) by mouth daily. (Patient taking differently: Take 80 mg by mouth at bedtime.)     Allergies:   Patient has no known allergies.   Social History   Tobacco Use   Smoking status: Never   Smokeless tobacco: Never  Substance Use Topics   Alcohol use: Yes    Alcohol/week: 3.0 standard drinks    Types: 3 Glasses of wine per week    Comment: social   Drug use: No     Family History: The patient's family history includes Heart disease in her father; Heart failure (age of onset: 58) in her father.  ROS:   Please see the history of present illness.    All other systems reviewed and are negative.  EKGs/Labs/Other Studies Reviewed:    The following studies were reviewed today: Echo 07/18/20  1. Left ventricular ejection fraction, by estimation, is 60 to 65%. The  left ventricle has normal function. The left ventricle has no regional  wall motion abnormalities. Left ventricular diastolic parameters are  consistent with Grade II diastolic  dysfunction (pseudonormalization). Elevated left atrial pressure.   2. Right ventricular systolic function is normal. The right ventricular  size is normal. There is mildly elevated pulmonary artery systolic  pressure. The estimated right ventricular systolic pressure is 35.4 mmHg.   3. Left atrial size was moderately dilated.   4. The mitral valve is normal in structure. No evidence of mitral valve  regurgitation.   5. The aortic valve is tricuspid. Aortic valve regurgitation is not  visualized. Mild aortic valve sclerosis is present,  with no evidence of  aortic valve stenosis.   6. The inferior vena cava is dilated in size with >50% respiratory  variability, suggesting right atrial pressure of 8 mmHg.   EKG:   02/26/21: Sinus rhythm, rate 63 bpm; PAC, poor R-Wave progression  Imaging studies that I have independently reviewed today: n/a  Recent Labs: 07/19/2020: Magnesium 2.1; TSH 2.386 10/11/2020: BUN 18; Creatinine, Ser 0.60; Hemoglobin 12.6; Platelets 279; Potassium 3.7; Sodium 139 11/09/2020: ALT 25  Recent Lipid Panel    Component Value Date/Time   CHOL 145 11/09/2020 0958   TRIG 68 11/09/2020 0958   HDL 61 11/09/2020 0958   CHOLHDL 2.4 11/09/2020 0958   CHOLHDL 3.6 07/17/2020 1222   VLDL 15 07/17/2020 1222   LDLCALC 70 11/09/2020 0958    Physical Exam:    VS:  BP (!) 127/57   Pulse  63   Ht 5\' 4"  (1.626 m)   Wt 100 lb 6.4 oz (45.5 kg)   SpO2 98%   BMI 17.23 kg/m     Wt Readings from Last 5 Encounters:  02/26/21 100 lb 6.4 oz (45.5 kg)  08/16/20 104 lb (47.2 kg)  08/15/20 103 lb 9.6 oz (47 kg)  07/17/20 108 lb 14.5 oz (49.4 kg)  12/28/19 105 lb (47.6 kg)    Constitutional: No acute distress Eyes: sclera non-icteric, normal conjunctiva and lids ENMT: normal dentition, moist mucous membranes Cardiovascular: regular rhythm, normal rate, no murmur. S1 and S2 normal. No jugular venous distention.  Respiratory: clear to auscultation bilaterally GI : normal bowel sounds, soft and nontender. No distention.   MSK: extremities warm, well perfused. No edema.  NEURO: grossly nonfocal exam, moves all extremities. PSYCH: alert and oriented x 3, normal mood and affect.   ASSESSMENT:    1. Paroxysmal atrial fibrillation (HCC)   2. Secondary hypercoagulable state ([place])   3. Hyperlipidemia, unspecified hyperlipidemia type   4. Cerebrovascular accident (CVA) due to embolism of precerebral artery (HCC)    PLAN:    Paroxysmal atrial fibrillation ([place]) - Plan: EKG 12-Lead Secondary hypercoagulable state  ([place]) - in sinus rhythm. -continue eliquis 2.5 mg BID  Hyperlipidemia, unspecified hyperlipidemia type Cerebrovascular accident (CVA) due to embolism of precerebral artery ([place]) - continue atorvastatin 80 mg daily, patient preference. Tolerating well.   Total time of encounter: 30 minutes total time of encounter, including 20 minutes spent in face-to-face patient care on the date of this encounter. This time includes coordination of care and counseling regarding above mentioned problem list. Remainder of non-face-to-face time involved reviewing chart documents/testing relevant to the patient encounter and documentation in the medical record. I have independently reviewed documentation from referring provider.   Cherlynn Kaiser, MD, [place]   Shared Decision Making/Informed Consent:       Medication Adjustments/Labs and Tests Ordered: Current medicines are reviewed at length with the patient today.  Concerns regarding medicines are outlined above.   Orders Placed This Encounter  Procedures   EKG 12-Lead     No orders of the defined types were placed in this encounter.   Patient Instructions  Medication Instructions:  No Changes In Medications at this time.  *If you need a refill on your cardiac medications before your next appointment, please call your pharmacy*  Follow-Up: At [facility], you and your health needs are our priority.  As part of our continuing mission to provide you with exceptional heart care, we have created designated Provider Care Teams.  These Care Teams include your primary Cardiologist (physician) and Advanced Practice Providers (APPs -  Physician Assistants and Nurse Practitioners) who all work together to provide you with the care you need, when you need it.  We recommend signing up for the patient portal called "MyChart".  Sign up information is provided on this After Visit Summary.  MyChart is used to connect with patients  for Virtual Visits (Telemedicine).  Patients are able to view lab/test results, encounter notes, upcoming appointments, etc.  Non-urgent messages can be sent to your provider as well.   To learn more about what you can do with MyChart, go to [email or web address].    Your next appointment:   6 month(s)  The format for your next appointment:   In Person  Provider:   Cherlynn Kaiser, MD   Wilhemina Bonito as a scribe for Elouise Munroe, MD.,have documented all  relevant documentation on the behalf of Elouise Munroe, MD,as directed by  Elouise Munroe, MD while in the presence of Elouise Munroe, MD.  I, Elouise Munroe, MD, have reviewed all documentation for this visit. The documentation on today's date of service for the exam, diagnosis, procedures, and orders are all accurate and complete.

## 2021-02-26 NOTE — Patient Instructions (Signed)
Medication Instructions:  °No Changes In Medications at this time.  °*If you need a refill on your cardiac medications before your next appointment, please call your pharmacy* ° °Follow-Up: °At [facility], you and your health needs are our priority.  As part of our continuing mission to provide you with exceptional heart care, we have created designated Provider Care Teams.  These Care Teams include your primary Cardiologist (physician) and Advanced Practice Providers (APPs -  Physician Assistants and Nurse Practitioners) who all work together to provide you with the care you need, when you need it. ° °We recommend signing up for the patient portal called "MyChart".  Sign up information is provided on this After Visit Summary.  MyChart is used to connect with patients for Virtual Visits (Telemedicine).  Patients are able to view lab/test results, encounter notes, upcoming appointments, etc.  Non-urgent messages can be sent to your provider as well.   °To learn more about what you can do with MyChart, go to [email or web address].   ° °Your next appointment:   °6 month(s) ° °The format for your next appointment:   °In Person ° °Provider:   °Gayatri Acharya, MD °

## 2021-02-26 NOTE — Progress Notes (Deleted)
Cardiology Office Note:    Date:  02/26/2021   ID:  Monica Ward, DOB August 02, 1929, MRN [id number]  PCP:  [facility]  Cardiologist:  Elouise Munroe, MD  Electrophysiologist:  None   Referring MD: Pa, Graham As*   Chief Complaint/Reason for Referral: PAF  History of Present Illness:    Monica Ward is a 87 y.o. female with a history of PAF, CVA, hyperlipidemia, chronic constipation, small bowel obstruction and leiomyosarcoma.  Patient had a history of atypical chest pain secondary to costochondritis in 2013.  More recently, she was admitted in March for acute CVA after found down at home.  Echocardiogram showed EF 60 to 65%, normal wall motion, grade 2 DD.  Initial plan was for placement of loop recorder however patient was noted to be going in and out of atrial fibrillation on telemetry.  Therefore she was started on Eliquis and Lopressor.  She also took antibiotic for possible urinary tract infection and was eventually discharged to skilled nursing facility.  In April, she returned to the ED after fall at [facility].  X-ray showed no acute finding. She has been taking off of metoprolol by the skilled nursing facility.   More recently, patient presented to the hospital ED on 09/14/2020 with nausea, vomiting and weakness.  CT of the head showed prior right MCA infarct, no acute finding.  Urinalysis was negative renal function and electrolyte were normal.  She was provided as needed Zofran to help with nausea.  She was seen again by ED on 10/11/2020 due to abdominal discomfort related to constipation.  Symptom improved after bowel movement.   Patient presents today along with her son.  Last visit seen by Almyra Deforest PA, and she was doing well at that time.   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 Patient had surgery in 2000 and mass which was presumed to be a leiomyosarcoma rather than a spindle cell cancer. I have no further records of path reports to give me a good definite path diagnosis of her tumor resection.    Precordial pain 02/12/2012   SBO (small bowel obstruction) ([place]) 08/2009   Early, partial SBO improved with conservative Tx     Past Surgical History:  Procedure Laterality Date   ABDOMINAL HYSTERECTOMY  ~ 1985   ovaries resected B; DUB.   APPENDECTOMY  1968   CATARACT EXTRACTION, BILATERAL  09/27/2015   CESAREAN SECTION  1968   COLECTOMY  2003   "cancer"   TONSILLECTOMY     "when I was a child"   TOTAL HIP ARTHROPLASTY Left 01/31/2015   Procedure:  ANTERIOR APPROACH  LEFT HEMI ARTHROPLASTY;  Surgeon: Rod Can, MD;  Location: [facility];  Service: Orthopedics;  Laterality: Left;   TUMOR REMOVAL  1990's;  X 2   "benign"    Current Medications: No outpatient medications have been marked as taking for the 02/26/21 encounter (Appointment) with  Elouise Munroe, MD.     Allergies:   Patient has no known allergies.   Social History   Tobacco Use   Smoking status: Never   Smokeless tobacco: Never  Substance Use Topics   Alcohol use: Yes    Alcohol/week: 3.0 standard drinks    Types: 3 Glasses of wine per week    Comment: social   Drug use: No     Family History: The patient's family history includes Heart disease in her father; Heart failure (age of onset: 39) in her father.  ROS:   Please see the history of present illness.    All other systems reviewed and are negative.  EKGs/Labs/Other  Studies Reviewed:    The following studies were reviewed today:  EKG:  ***  Imaging studies that I have independently reviewed today: ***  Recent Labs: 07/19/2020: Magnesium 2.1; TSH 2.386 10/11/2020: BUN 18; Creatinine, Ser 0.60; Hemoglobin 12.6; Platelets 279; Potassium 3.7; Sodium 139 11/09/2020: ALT 25  Recent Lipid Panel    Component Value Date/Time   CHOL 145 11/09/2020 0958   TRIG 68 11/09/2020 0958   HDL 61 11/09/2020 0958   CHOLHDL 2.4 11/09/2020 0958   CHOLHDL 3.6 07/17/2020 1222   VLDL 15 07/17/2020 1222   LDLCALC 70 11/09/2020 0958    Physical Exam:    VS:  There were no vitals taken for this visit.    Wt Readings from Last 5 Encounters:  08/16/20 104 lb (47.2 kg)  08/15/20 103 lb 9.6 oz (47 kg)  07/17/20 108 lb 14.5 oz (49.4 kg)  12/28/19 105 lb (47.6 kg)  05/02/19 110 lb (49.9 kg)    Constitutional: No acute distress Eyes: sclera non-icteric, normal conjunctiva and lids ENMT: normal dentition, moist mucous membranes Cardiovascular: regular rhythm, normal rate, no murmur. S1 and S2 normal. No jugular venous distention.  Respiratory: clear to auscultation bilaterally GI : normal bowel sounds, soft and nontender. No distention.   MSK: extremities warm, well perfused. No edema.  NEURO: grossly nonfocal exam, moves all extremities. PSYCH: alert and oriented x 3, normal mood and affect.   ASSESSMENT:    No diagnosis found. PLAN:    No diagnosis found.  Total time of encounter: *** minutes total time of encounter, including *** minutes spent in face-to-face patient care on the date of this encounter. This time includes coordination of care and counseling regarding above mentioned problem list. Remainder of non-face-to-face time involved reviewing chart documents/testing relevant to the patient encounter and documentation in the medical record. I have independently reviewed documentation from referring provider.   Cherlynn Kaiser, MD, [place]   Shared Decision Making/Informed Consent:   {Are you ordering a CV Procedure (e.g. stress test, cath, DCCV, TEE, etc)?   Press F2        :[id number]}   Medication Adjustments/Labs and Tests Ordered: Current medicines are reviewed at length with the patient today.  Concerns regarding medicines are outlined above.   No orders of the defined types were placed in this encounter.   No orders of the defined types were placed in this encounter.   There are no Patient Instructions on file for this visit.

## 2021-04-18 ENCOUNTER — Telehealth: Payer: Self-pay | Admitting: Student

## 2021-04-18 NOTE — Telephone Encounter (Signed)
Patient's son calling to see if the forms have been filled out for the patient already. Please advise

## 2021-04-24 NOTE — Telephone Encounter (Signed)
Son of the patient called. He is following up to see if the patient's Forms for her Eliquis Assistance application have been filled out yet.   The patient's Son has a 04/27/21 deadline to send them back to the company

## 2021-04-24 NOTE — Telephone Encounter (Signed)
Attempted to return call to patients son, unable to reach. Left message stating that forms have been completed and already faxed back to company.

## 2021-04-25 NOTE — Telephone Encounter (Signed)
Attempted to return call to patients son, unable to reach- left message advising that forms have been faxed and to call back with any issues or concerns.

## 2021-05-09 NOTE — Telephone Encounter (Signed)
Received fax from Eliquis Patient assistance foundation stating that patient is not approved due to yearly income being too high for eligibility along with 3% out of pocket not being met. Spoke with patients son Monica Ward (okay per [place]) and advised him of the above. Advised him that he can call the foundation to see what the 3% met out of pocket would be for patient to qualify. Patients son states that patient is now in a facility and getting her meds through a medicine service there. Patient will contact facility to see if there is anything that needs to be done for her Eliquis prescription and will call if application needs to be re done after criteria met. Per son patient is receiving her Eliquis twice daily as ordered now.   Advised patient's to call back to office with any issues, questions, or concerns. Patient's verbalized understanding.

## 2021-08-27 ENCOUNTER — Telehealth: Payer: Self-pay

## 2021-08-27 NOTE — Telephone Encounter (Signed)
Left message for david, aware dr Margaretann Loveless is back in the office Monday 09/02/21 and they will be faxed once signed. Also made him aware there are samples at the front desk for pickup. ?

## 2021-08-27 NOTE — Telephone Encounter (Signed)
Patient's son returning call. He states he would like a call back updating him when the forms are submitted. ?

## 2021-08-27 NOTE — Telephone Encounter (Signed)
Received patient assistance application for Monica Ward for patient that was dropped off at office. Per note left on forms- patient will be out of Monica Ward in 1 week.  ? ?Will fill out provider portion of forms and have signed when Dr. Margaretann Loveless is back in office. Will send to PharmD to see if patient can get samples until forms are complete.  ? ? ?

## 2021-08-27 NOTE — Telephone Encounter (Signed)
Monica Ward, Monica Ward, RPH-CPP  You 15 minutes ago (8:47 AM)  ? ?Yes, you can get her some 2.5 mg samples,  ? ?Erasmo Downer   ? ? ?Attempted to return call to Shanon Brow (okay per [facility]) to advise him that patient assistance forms will be signed once Dr. Margaretann Loveless is back in office next week.  ? ?To ensure patient does not run out of Eliquis- samples have been left at the front desk for pick up in the mean time. Eliquis 2.'5mg'$  Twice daily LOT: [id number] Exp 9/23.  ? ?Left message for Shanon Brow to call back.  ?

## 2021-08-28 NOTE — Telephone Encounter (Signed)
Attempted to call patient's son, left message for patient's son to call back to office.  ? ?

## 2021-09-02 NOTE — Telephone Encounter (Signed)
Patient assistance forms have been faxed to foundation today 5/8.  ?

## 2021-09-25 ENCOUNTER — Telehealth: Payer: Self-pay | Admitting: Internal Medicine

## 2021-09-25 ENCOUNTER — Encounter: Payer: Self-pay | Admitting: Cardiology

## 2021-09-25 NOTE — Telephone Encounter (Signed)
Patient calling the office for samples of medication:   1.  What medication and dosage are you requesting samples for? apixaban (ELIQUIS) 2.5 MG TABS tablet  2.  Are you currently out of this medication? No, 1 week supply

## 2021-09-25 NOTE — Telephone Encounter (Signed)
Samples provided. Multiple encounters open. Please see phone encounter for more details.

## 2021-09-25 NOTE — Telephone Encounter (Signed)
error 

## 2021-09-25 NOTE — Telephone Encounter (Signed)
Spoke with pt's son regarding eliquis pt assistance, Shanon Brow states that he called [employer] and they could not find where an application had been submitted. Will forward to primary nurse. Duffy Rhody that I could leave samples at the front desk. Shanon Brow said that he will be able to stop by tomorrow to pick those up.       Medication samples have been provided to the patient.  Drug name: Eliquis 2.'5mg'$  Qty: 3 boxes LOT: [id number] Exp.Date: 07/2022  Samples left at front desk for patient pick-up. Patient notified.

## 2021-09-25 NOTE — Telephone Encounter (Signed)
Application has been re faxed to patient assistance foundation.   Called and spoke with Shanon Brow- made him aware. He verbalized understanding and will call back with any issues.

## 2021-09-25 NOTE — Telephone Encounter (Signed)
Patient's son states the forms have not been received and requests they be faxed again.

## 2022-04-28 DEATH — deceased
